# Patient Record
Sex: Female | Born: 1935 | Race: White | Hispanic: No | Smoking: Never smoker
Health system: Southern US, Community
[De-identification: ages and names within clinical notes are randomized; demographics above are authoritative.]

## PROBLEM LIST (undated history)

## (undated) DIAGNOSIS — G5600 Carpal tunnel syndrome, unspecified upper limb: Secondary | ICD-10-CM

## (undated) DIAGNOSIS — I10 Essential (primary) hypertension: Secondary | ICD-10-CM

## (undated) DIAGNOSIS — Z973 Presence of spectacles and contact lenses: Secondary | ICD-10-CM

## (undated) DIAGNOSIS — R269 Unspecified abnormalities of gait and mobility: Secondary | ICD-10-CM

## (undated) DIAGNOSIS — E039 Hypothyroidism, unspecified: Secondary | ICD-10-CM

## (undated) DIAGNOSIS — Z8673 Personal history of transient ischemic attack (TIA), and cerebral infarction without residual deficits: Secondary | ICD-10-CM

## (undated) DIAGNOSIS — M810 Age-related osteoporosis without current pathological fracture: Secondary | ICD-10-CM

## (undated) DIAGNOSIS — E785 Hyperlipidemia, unspecified: Secondary | ICD-10-CM

## (undated) DIAGNOSIS — M1990 Unspecified osteoarthritis, unspecified site: Secondary | ICD-10-CM

## (undated) DIAGNOSIS — Z974 Presence of external hearing-aid: Secondary | ICD-10-CM

## (undated) HISTORY — PX: COLONOSCOPY: SHX174

## (undated) HISTORY — DX: Hyperlipidemia, unspecified: E78.5

## (undated) HISTORY — PX: TONSILLECTOMY: SUR1361

## (undated) HISTORY — DX: Personal history of transient ischemic attack (TIA), and cerebral infarction without residual deficits: Z86.73

## (undated) HISTORY — PX: APPENDECTOMY: SHX54

---

## 2005-10-26 HISTORY — PX: SUBDURAL HEMATOMA EVACUATION VIA CRANIOTOMY: SUR319

## 2005-11-14 ENCOUNTER — Emergency Department: Payer: Self-pay | Admitting: Emergency Medicine

## 2005-11-15 ENCOUNTER — Emergency Department (HOSPITAL_COMMUNITY): Admission: EM | Admit: 2005-11-15 | Discharge: 2005-11-15 | Payer: Self-pay | Admitting: Emergency Medicine

## 2005-11-17 ENCOUNTER — Inpatient Hospital Stay (HOSPITAL_COMMUNITY): Admission: AD | Admit: 2005-11-17 | Discharge: 2005-11-23 | Payer: Self-pay | Admitting: Neurology

## 2006-03-17 ENCOUNTER — Ambulatory Visit: Payer: Self-pay | Admitting: Internal Medicine

## 2006-04-20 ENCOUNTER — Ambulatory Visit (HOSPITAL_COMMUNITY): Admission: RE | Admit: 2006-04-20 | Discharge: 2006-04-20 | Payer: Self-pay

## 2006-12-23 ENCOUNTER — Ambulatory Visit: Payer: Self-pay | Admitting: Internal Medicine

## 2007-07-14 ENCOUNTER — Ambulatory Visit: Payer: Self-pay | Admitting: Internal Medicine

## 2008-02-29 ENCOUNTER — Ambulatory Visit: Payer: Self-pay | Admitting: Internal Medicine

## 2009-03-04 ENCOUNTER — Ambulatory Visit: Payer: Self-pay | Admitting: Internal Medicine

## 2009-05-15 ENCOUNTER — Ambulatory Visit: Payer: Self-pay | Admitting: Ophthalmology

## 2009-05-18 ENCOUNTER — Emergency Department: Payer: Self-pay | Admitting: Emergency Medicine

## 2009-06-04 ENCOUNTER — Ambulatory Visit: Payer: Self-pay | Admitting: Ophthalmology

## 2010-04-10 ENCOUNTER — Ambulatory Visit: Payer: Self-pay | Admitting: Internal Medicine

## 2010-07-03 ENCOUNTER — Ambulatory Visit: Payer: Self-pay | Admitting: Ophthalmology

## 2010-07-14 ENCOUNTER — Ambulatory Visit: Payer: Self-pay | Admitting: Ophthalmology

## 2010-10-02 ENCOUNTER — Emergency Department (HOSPITAL_COMMUNITY): Admission: EM | Admit: 2010-10-02 | Discharge: 2010-03-14 | Payer: Self-pay | Admitting: Emergency Medicine

## 2011-03-13 NOTE — Procedures (Signed)
EEG NUMBER:  [id number].   ORDERED BY:  Dr. Avie Echevaria.   INDICATION:  This is a 77 year old woman being evaluated for subdural  hemorrhage.   DESCRIPTION OF PROCEDURE:  This a routine 17-channel EEG with 1 channel  devoted to EKG, utilizing the International 10/20 Lead Placement System.  The patient is described clinically as being awake and electrographically  also appears to be awake.  Medications listed include Synthroid, Evista and  aspirin.  The patient does appear to be in the waking state throughout the  course of the recording.  The background consists of a well-organized, well-  developed, well-modulated 10-Hz alpha activity which is predominant in the  posterior head regions and briskly reactive to eye opening.  No clear  interhemispheric asymmetry is identified.  No definite epileptiform  discharges are noted.  Hyperventilation was not performed.  Photic  stimulation was performed, but did not produce any significant change in the  background activity.  The EKG monitor reveals relatively regular rhythm with  a rate of 90 beats per minute.   CONCLUSION:  Normal EEG in the waking state without seizure activity or  focal abnormality seen during the course of today's recording.  Clinical  correlation is recommended.      Catherine A. Orlin Hilding, M.D.  Electronically Signed     ZOX:WRUE  D:  11/17/2005 16:14:45  T:  11/18/2005 09:31:14  Job #:  [id number]

## 2011-03-13 NOTE — Consult Note (Signed)
NAMEJESSI, Vargas NO.:  [id number]   MEDICAL RECORD NO.:  [id number]          PATIENT TYPE:  INP   LOCATION:  2550                         FACILITY:  [facility]   PHYSICIAN:  Danae Orleans. Venetia Maxon, M.D.  DATE OF BIRTH:  [birth year]   DATE OF CONSULTATION:  11/18/2005  DATE OF DISCHARGE:                                   CONSULTATION   REFERRING PHYSICIAN:  Dr. Genene Churn. Love.   REASON FOR CONSULTATION:  Left subdural hematoma.   HISTORY OF ILLNESS:  [name] is a 77 year old woman who was admitted by  Dr. Avie Echevaria to the [facility] for worsening of dizziness and  right-sided weakness, and I was asked to see the patient in consultation.  The patient has been found to have a left-sided subdural hematoma in the  parietal region which appears to be chronic and has been having problems  with hand weakness and right hand clumsiness.  She was doing well until  November 14, 2005, when she developed right hand weakness.  She had a head CT  which showed a subdural hematoma.  There is no history of head trauma.  She  was then admitted for workup and an MRI shows an 8-mm subdural hematoma with  no evidence of stroke.  She has had an EEG which shows no evidence of  seizure.  She had carotid Dopplers which showed no evidence of plaque or  atherosclerotic occlusive disease.  She has a history of Raynaud's and  thyroid disease and osteoporosis.  She has had benign breast lumps removed.  I saw the patient and spoke with the patient and with Dr. Sandria Manly and was of  the opinion that this left-sided subdural hematoma, which could indeed be  causing some local mass effect which might be causing right-sided weakness  and given the fact that she has no evidence of seizures or other cause of  this problem, that it was reasonable to go ahead with bur hole drainage of  the subdural hematoma.   PHYSICAL EXAMINATION:  NEUROLOGIC:  On examination, the patient has right  hand intrinsic  and finger extensor weakness and does have right pronator  drift.   ASSESSMENT AND PLAN:  She does recall that she may have hit her head on a  cabinet or attic door.  I felt that the subdural was relatively  longstanding.  After a lengthy discussion with the patient and the patient's  family, she wishes to go ahead with the surgery and this will consist of bur  hole drainage of the subdural hematoma, and this is set up for November 19, 2005.      Danae Orleans. Venetia Maxon, M.D.  Electronically Signed     JDS/MEDQ  D:  11/19/2005  T:  11/20/2005  Job:  [id number]

## 2011-03-13 NOTE — Op Note (Signed)
NAMEGWENDOLIN, [name] NO.:  [id number]   MEDICAL RECORD NO.:  [id number]          PATIENT TYPE:  INP   LOCATION:  2550                         FACILITY:  [facility]   PHYSICIAN:  Danae Orleans. Venetia Maxon, M.D.  DATE OF BIRTH:  Dec 20, 1926   DATE OF PROCEDURE:  11/19/2005  DATE OF DISCHARGE:                                 OPERATIVE REPORT   PREOPERATIVE DIAGNOSIS:  Left parietal occipital subdural hematoma with  right drift and weakness.   POSTOPERATIVE DIAGNOSIS:  Left parietal occipital subdural hematoma with  right drift and weakness.   OPERATION PERFORMED:  Bur hole drainage of left subdural hematoma.   SURGEON:  Danae Orleans. Venetia Maxon, M.D.   ANESTHESIA:  General endotracheal.   ESTIMATED BLOOD LOSS:  Minimal.   COMPLICATIONS:  None.   DISPOSITION:  Recovery.   INDICATIONS FOR PROCEDURE:  [name] is a 77 year old woman with right  pronator drift and right upper extremity weakness.  She has a left parietal  occipital subdural hematoma with local mass effect.  It was elected to take  her to surgery for drainage of subdural hematoma.   DESCRIPTION OF PROCEDURE:  [name] was brought to the operating room.  After smooth and uncomplicated induction of general endotracheal anesthesia,  she was placed in a prone position using a Proneview head holder and her  left parietal scalp was then shaved, prepped and draped in the usual sterile  fashion.  A linear incision was made over what was felt to be the epicenter  of the subdural and this was carried through the galea.  A bur hole was  placed.  Dura was incised and motor oil consistence subdural fluid was  released under moderate but not high pressure.  Subsequently, the subdural  space was irrigated.  There did not appear to be septations.  A #7 Al Pimple drain was placed through a separate stab incision and then this was  anchored with a nylon stitch. The wound was closed with interrupted 2-0  Vicryl stitches  reapproximating the galea and a running 3-0 nylon stitch and  a sterile occlusive dressing was placed.  Drain was placed on thumb suction.  The patient was extubated in the operating room and taken to recovery room  in stable and satisfactory condition having tolerated the operation well.  All counts were correct at the end of the case.      Danae Orleans. Venetia Maxon, M.D.  Electronically Signed    JDS/MEDQ  D:  11/19/2005  T:  11/20/2005  Job:  [id number]

## 2011-03-13 NOTE — H&P (Signed)
NAMEDAVINIA, RICCARDI NO.:  [id number]   MEDICAL RECORD NO.:  [id number]          PATIENT TYPE:  INP   LOCATION:  3027                         FACILITY:  [facility]   PHYSICIAN:  Genene Churn. Love, M.D.    DATE OF BIRTH:  [birth year]   DATE OF ADMISSION:  11/17/2005  DATE OF DISCHARGE:                                HISTORY & PHYSICAL   PATIENT ADDRESS:  [address] [place], [place] [address]   This is the first [facility] admission for this 77 year old right-  handed, Caucasian, married female from [place], [place], seen on  surgery basis and admitted for evaluation of symptoms generalized weakness  and right hand and arm clumsiness.   HISTORY OF PRESENT ILLNESS:  Mrs. Gruenhagen was in her usual state of health  until Saturday morning, November 14, 2005, when she felt weak and was not  really certain why  She notes that her right arm was not feeling well.  She  had slurred speech, and she was to drive to [place], but en route returned  home and was seen in [facility] where a CT scan was  performed showing evidence of what was suspected to be a left  temporoparietal chronic subdural hematoma.  On Sunday, November 15, 2005, she  noted poor handwriting, difficulty with balance, and some cramping episodes  in her right arm and hand distally.  She was seen at [facility] on  November 15, 2005, underwent an MRI study of the brain showing an 8 mm left  frontotemporal-parietal subdural hematoma with some compression of the  cortical surface . This was thought to represent a chronic structure.  Other  considerations were the possibility of an epidural hematoma, but there was  no history of head or neck trauma.  She had no evidence of any underlying  acute stroke and very little, if any, small vessel white matter ischemic  changes present.  She was seen by me November 16, 2005, on Monday in the  office for evaluation of the above at which  time I noted some clumsiness in  her right hand and arm but no other focal abnormalities and felt her  symptomatology was possibly a waxing and waning from the subdural and  recommended EEG and Dopplers which were scheduled for November 18, 2005.   Today she came in on an urgent basis because she was having sensations of  feeling weak, having to lie down, but no definite new evidence of focal  weakness or history of documented seizure.  Because of her clinical  symptomatology, she is admitted for further evaluation.   MEDICAL REVIEW OF SYSTEMS:  Significant for possible seizures involving her  right hand and arm, frequent urination, speech difficulty, hearing loss,  poor circulation in her hands.   PAST MEDICAL HISTORY:  Significant for:  1.  Thyroid problems for 15 years.  2.  Raynaud's disease.  3.  Osteoporosis for 10 years.  4.  Benign breast lumps removed.   MEDICATIONS:  1.  Synthroid, dosage uncertain, probably 100 mcg per day.  2.  Evista 60 mg once per day.  3.  Aspirin 81 mg once per day.  4.  Calcium 500 mg 2 per day.   ALLERGIES:  No known allergies.   SOCIAL HISTORY:  She is married, happy most of the time.  Denies any home  problems.  Lives with her husband, has 4 daughters, ages 70 to 82, living  and well.   HEALTH HABITS:  She does not use caffeine or tobacco.  She does drink 2 to 3  ounces of alcohol per week.  She had some college and last worked in 1994.   FAMILY HISTORY:  Significant in that 2 of her daughters have had illness,  one celiac, the other Graves disease Sjogren's.  Her mother died in 63  from stomach cancer, father at 48 from chronic obstructive pulmonary  disease.  She has a brother who died from a brain tumor. She has sisters 40  through 51 living and well.  Her daughter has had celiac disease and  Sjogren's as mentioned.  A daughter of the patient had thyroid disease.  Her  mother and sister have had high blood pressure.   PHYSICAL  EXAMINATION:  GENERAL: Well-developed, pleasant white female.  VITAL SIGNS: Weight 136 pounds, height 62 inches.  Blood pressure in the  right and left arm 140/80, heart rate 80 and regular.  NECK: There were no carotid or supraclavicular bruits heard today.  Neck  flexion and extension maneuvers were unremarkable.  NEUROLOGIC:  Mental Status: She was alert and oriented x3.  Followed 1, 2,  and 3-step commands .  Scored 29/30 on MMSE.  Her cranial nerve examination  revealed acuity not checked today.  It was 20/30 on the right arm, 20/40 -1  on November 16, 2005.  The visual fields were full. Disks were flat.  Spontaneous venous pulsation seen.  Extraocular movements were full, and  corneals were present.  Facial sensation was equal.  Facial motor asymmetry.  Hearing was intact.  Air conduction was greater than bone conduction.  Tongue was midline.  The uvula was midline, and gags were present.  Motor  examination revealed good strength in the upper and lower extremities.  With  the dynamometer, she squeezed x1 14 kg right hand -5, 43 kg right hand x1,  12 kg right hand x5, 42 kg left hand.  Finger tapping could be done 33 times  in 10 seconds, right hand index finger 32 times in 10 seconds.  Left hand  index finger done. Sensory examination was intact to pinprick, light touch,  turning of positron, and vibration testing.  Deep tendon reflexes were 1 to  2+.  Plantar responses were downgoing. She had some clumsiness of the right  hand and arm.  HEENT: Hearing aid in the left ear.  Right tympanic membrane clear.  Mouth  in good repair.  LUNGS: Clear to auscultation.  HEART:  No murmurs.  BREASTS: Without masses.  ABDOMEN:  No enlargement of liver, spleen, or kidneys.  EXTREMITIES:  No cyanosis, clubbing, or edema.   IMPRESSION:  1.  Right hand and arm clumsiness (Code 342.10).  2.  Subdural hematoma (Code 852.20).  PLAN:  Consider the possibility of seizures, underlying transient  ischemic  attacks, or the subdural itself causing her symptomatology.  Plan is to  admit her for EEG, Doppler studies, and neurosurgical consultation.          ______________________________  Genene Churn. Sandria Manly, M.D.    JML/MEDQ  D:  11/17/2005  T:  11/17/2005  Job:  [id number]

## 2011-03-13 NOTE — Discharge Summary (Signed)
[name], [name] NO.:  [id number]   MEDICAL RECORD NO.:  [id number]          PATIENT TYPE:  INP   LOCATION:  3018                         FACILITY:  [facility]   PHYSICIAN:  Genene Churn. Love, M.D.    DATE OF BIRTH:  1927/07/24   DATE OF ADMISSION:  11/17/2005  DATE OF DISCHARGE:  11/23/2005                                 DISCHARGE SUMMARY   This was the first [facility] admission for this 77 year old right-  handed Caucasian married female from [place], [place]. Seen on an  urgent basis in the office and admitted for evaluation of right-sided hand  clumsiness and known subdural hematoma.   HISTORY OF PRESENT ILLNESS:  Ms. Paterson developed generalized weakness on  November 14, 2005 and felt that her right hand arm were not working quite  right in a trip to [place], [place]. She returned to [facility] where CT scan was performed of the brain showing a left  temporoparietal chronic subdural hematoma. On Sunday, November 15, 2005, she  had poor handwriting, difficulty with balance and some cramping episodes in  her right hand and arm. She was seen at [facility] and underwent a  MRI study of the brain showing an 8 mm left frontal temporoparietal subdural  hematoma with some compression of the cortical surface but no shift of  midline structures. There was no underlying stroke and the considerations  were that of epidural hematoma versus subdural hematoma. There was thought  to be some blood in the subarachnoid space near the subdural surface. There  was very little if any small vessel ischemic changes. She was seen by me for  the first time on November 16, 2005 on Monday in the office for evaluation of  the above and noted to have some right hand and arm clumsiness. It was not  clear whether subdural or possibly underlying stroke or possibly seizure was  the cause of her symptomatology and she was scheduled for EEG and  Doppler  studies as an outpatient but developed continued right hand and arm  clumsiness, not feeling well and was seen in the office on November 17, 2005  and admitted for neurosurgical consultation and evaluation.   Her past medical history is significant for thyroid problems for 15 years,  Raynaud's disease, osteoporosis for 10 years.   Medications at the time of admission included Synthroid 100 mcg per day,  Evista 60 milligrams once a day, aspirin 81 milligrams once a day, calcium  500 milligrams twice a day. She has no known history of allergies. Details  of her social history and family history been dictated elsewhere.  Examination at the time of admission revealed normal blood pressures and  some clumsiness in her right hand and arm as noted on the admission notes.   LABORATORY DATA:  Laboratory data in the hospital revealed white blood cell  count of 8500, hemoglobin 13.6, hematocrit 39.1, platelet count 278,000. Sed  rate was 13. Pro time was 13.6. INR was 1.0. PTT was 28. Bleeding time 3.0.  Sodium 140, potassium 3.6.  Chloride 106, CO2 content 30, glucose 153, BUN  18, creatinine 0.9. Total bilirubin 0.3, alk phos 63, SGOT 21, SGPT 17,  total protein 6.6, albumin 3.5, calcium 9.3. Repeat electrolytes in the  hospital and basic metabolic panel were normal except creatinine went to 1.3  and on April 20, 2006 it had returned to 0.9. Her chest x-ray was normal. Her  12-lead EKG showed normal sinus rhythm. It was a normal EKG. A TSH was  4.422. Repeat Doppler studies of the carotids were unremarkable. EEG was  normal. Repeat MRI study of the brain following admission showed no change  in the 8 mm subdural hematoma on the left frontal temporoparietal region.  Again no shift and no underlying stroke noted. CT scan postoperatively  showed drain in place and no evidence of subdural hematoma.   HOSPITAL COURSE:  The patient was admitted, underwent Doppler study which  was unremarkable  and EEG which was unremarkable the day of admission. She  was seen the following day by Dr. Imelda Pillow. She underwent pro time, PTT and  INR. No clear cut history of head trauma was obtained by neurologist but Dr.  Venetia Maxon felt that there was a history of her having struck her head enough  that this could have caused a subdural hematoma. She underwent evacuation of  the subdural hematoma on November 19, 2005. Postoperatively, CT scan showed  that there was no evidence of a subdural. Drain was removed on November 22, 2005. The patient's right hand and arm clumsiness improved in the hospital  following surgery and was just about back to normal. She could write with  her hand. She noted some clumsiness on walking.   IMPRESSION:  1.  Subdural hematoma, code 852.20 with right hand and arm clumsiness, code      342.10.  2.  Gait disorder, code 781.2.  3.  History of hyperthyroidism, code 244.9.  4.  Osteoporosis, code unknown.   PLAN:  At this time, we will discharge the patient on Keppra 250 milligrams  t.i.d. for 1 month and then taper, levothyroxine 100 mcg p.o. daily, Evista  60 milligrams daily, calcium 500 milligrams b.i.d. She is not to take  aspirin. She is discharged on seizure precautions, not to drive a car and  return to see me in four weeks and Dr. Venetia Maxon in the next few weeks.           ______________________________  Genene Churn. Sandria Manly, M.D.     JML/MEDQ  D:  11/23/2005  T:  11/24/2005  Job:  [id number]   cc:   Danae Orleans. Venetia Maxon, M.D.  Fax: [phone]   Dewaine Oats  Fax: [phone]

## 2011-06-15 ENCOUNTER — Ambulatory Visit: Payer: Self-pay | Admitting: Internal Medicine

## 2011-09-10 ENCOUNTER — Ambulatory Visit: Payer: Self-pay | Admitting: Internal Medicine

## 2011-12-15 DIAGNOSIS — I10 Essential (primary) hypertension: Secondary | ICD-10-CM | POA: Diagnosis not present

## 2011-12-15 DIAGNOSIS — R413 Other amnesia: Secondary | ICD-10-CM | POA: Diagnosis not present

## 2011-12-15 DIAGNOSIS — Z8669 Personal history of other diseases of the nervous system and sense organs: Secondary | ICD-10-CM | POA: Diagnosis not present

## 2012-05-04 DIAGNOSIS — E785 Hyperlipidemia, unspecified: Secondary | ICD-10-CM | POA: Diagnosis not present

## 2012-05-04 DIAGNOSIS — Z0000 Encounter for general adult medical examination without abnormal findings: Secondary | ICD-10-CM | POA: Diagnosis not present

## 2012-05-04 DIAGNOSIS — I10 Essential (primary) hypertension: Secondary | ICD-10-CM | POA: Diagnosis not present

## 2012-05-04 DIAGNOSIS — E039 Hypothyroidism, unspecified: Secondary | ICD-10-CM | POA: Diagnosis not present

## 2012-05-13 DIAGNOSIS — E039 Hypothyroidism, unspecified: Secondary | ICD-10-CM | POA: Diagnosis not present

## 2012-05-13 DIAGNOSIS — Z1289 Encounter for screening for malignant neoplasm of other sites: Secondary | ICD-10-CM | POA: Diagnosis not present

## 2012-05-13 DIAGNOSIS — Z1212 Encounter for screening for malignant neoplasm of rectum: Secondary | ICD-10-CM | POA: Diagnosis not present

## 2012-05-13 DIAGNOSIS — I10 Essential (primary) hypertension: Secondary | ICD-10-CM | POA: Diagnosis not present

## 2012-06-17 ENCOUNTER — Ambulatory Visit: Payer: Self-pay | Admitting: Internal Medicine

## 2012-06-17 DIAGNOSIS — Z1231 Encounter for screening mammogram for malignant neoplasm of breast: Secondary | ICD-10-CM | POA: Diagnosis not present

## 2012-07-20 DIAGNOSIS — H903 Sensorineural hearing loss, bilateral: Secondary | ICD-10-CM | POA: Diagnosis not present

## 2012-07-20 DIAGNOSIS — H6120 Impacted cerumen, unspecified ear: Secondary | ICD-10-CM | POA: Diagnosis not present

## 2012-07-26 DIAGNOSIS — Z23 Encounter for immunization: Secondary | ICD-10-CM | POA: Diagnosis not present

## 2012-10-24 DIAGNOSIS — H43819 Vitreous degeneration, unspecified eye: Secondary | ICD-10-CM | POA: Diagnosis not present

## 2012-11-08 DIAGNOSIS — M129 Arthropathy, unspecified: Secondary | ICD-10-CM | POA: Diagnosis not present

## 2012-11-08 DIAGNOSIS — R109 Unspecified abdominal pain: Secondary | ICD-10-CM | POA: Diagnosis not present

## 2013-06-19 ENCOUNTER — Ambulatory Visit: Payer: Self-pay | Admitting: Internal Medicine

## 2013-06-30 ENCOUNTER — Other Ambulatory Visit: Payer: Self-pay | Admitting: Orthopedic Surgery

## 2013-07-04 ENCOUNTER — Encounter (HOSPITAL_BASED_OUTPATIENT_CLINIC_OR_DEPARTMENT_OTHER): Payer: Self-pay | Admitting: *Deleted

## 2013-07-04 NOTE — Progress Notes (Signed)
Daughter staying with her-to bring for ekg and bmet

## 2013-07-05 ENCOUNTER — Other Ambulatory Visit: Payer: Self-pay

## 2013-07-05 ENCOUNTER — Encounter (HOSPITAL_BASED_OUTPATIENT_CLINIC_OR_DEPARTMENT_OTHER)
Admission: RE | Admit: 2013-07-05 | Discharge: 2013-07-05 | Disposition: A | Discharge status: Home / Self Care | Payer: Medicare Other | Source: Ambulatory Visit | Attending: Orthopedic Surgery | Admitting: Orthopedic Surgery

## 2013-07-05 DIAGNOSIS — Z01812 Encounter for preprocedural laboratory examination: Secondary | ICD-10-CM | POA: Insufficient documentation

## 2013-07-05 DIAGNOSIS — Z01818 Encounter for other preprocedural examination: Secondary | ICD-10-CM | POA: Insufficient documentation

## 2013-07-05 DIAGNOSIS — Z01810 Encounter for preprocedural cardiovascular examination: Secondary | ICD-10-CM | POA: Insufficient documentation

## 2013-07-05 LAB — BASIC METABOLIC PANEL
BUN: 22 mg/dL (ref 6–23)
Creatinine, Ser: 0.78 mg/dL (ref 0.50–1.10)
GFR calc Af Amer: 87 mL/min — ABNORMAL LOW (ref >90)
GFR calc non Af Amer: 75 mL/min — ABNORMAL LOW (ref >90)
Glucose, Bld: 104 mg/dL — ABNORMAL HIGH (ref 70–99)
Potassium: 4.2 mEq/L (ref 3.5–5.1)

## 2013-07-10 NOTE — H&P (Signed)
[name] is an 79 y.o. female.   Chief Complaint: c/o chronic and progressive numbness and tingling of the left hand HPI:.  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  She had significant leg pain in 2007 and was diagnosed by her PCP and Dr. Phylliss Bob to have polymyalgia rheumatica. She was initially treated with high dose steroids and has tapered down to 5 mg. daily.  During the past week she has had severe stiffness of her left hand develop with generalized swelling.  She has lost her extension in flexion creases of the left hand.  The appearance of her hand is that of either an acute gout or a dystrophic response such as CRPS type I or II.    She saw Dr. Azzie Roup for evaluation and had an ultrasound that was nondiagnostic.  She has continued her prednisone at 5 mg. daily.  Her last sed rate was one year prior.  She is extremely stiff on the left and cannot close her fingers to the palm.  She has mild tingling day and night. She is now referred for an upper extremity orthopaedic consult.      Past Medical History  Diagnosis Date  . Hypertension   . Arthritis   . Carpal tunnel syndrome   . Osteoporosis   . Gait abnormality     has for 42yr-no use of cane  . Hypothyroidism   . Wears glasses   . Wears hearing aid     Past Surgical History  Procedure Laterality Date  . Tonsillectomy    . Appendectomy    . Subdural hematoma evacuation via craniotomy  2007  . Colonoscopy      History reviewed. No pertinent family history. Social History:  reports that she has never smoked. She does not have any smokeless tobacco history on file. She reports that  drinks alcohol. She reports that she does not use illicit drugs.  Allergies: No Known Allergies  No prescriptions prior to admission    No results found for this or any previous  visit (from the past 48 hour(s)).  No results found.   Pertinent items are noted in HPI.  Height 5\' 3"  (1.6 m), weight 65.318 kg (144 lb).  General appearance: alert Head: Normocephalic, without obvious abnormality Neck: supple, symmetrical, trachea midline Resp: clear to auscultation bilaterally Cardio: regular rate and rhythm GI: normal findings: bowel sounds normal Extremities:  Inspection of her hands reveals normal appearance of her right hand except for Heberden's and Bouchard's nodes.  She has pinch and grip impairment. Pulse and cap refill are intact.  Her motor and sensory examination is intact to light touch.  She has normal motor function.  She has Dupuytren's palmar fibromatosis bilaterally.    Plain films of her hands including the wrist demonstrate significant joint space n narrowing of the interphalangeal joints, left more prominent than right.  She has normal appearing carpus except for STT narrowing bilaterally and on the lateral film of the left wrist a volar ossicle.  She does not show frank chondrocalcinosis.  I asked Dr. Johna Roles to complete detailed electrodiagnostic studies.  These did, indeed, document bilateral carpal tunnel syndrome that was moderately severe.   Pulses: 2+ and symmetric Skin: normal Neurologic: Grossly normal    Assessment/Plan Impression: Left CTS  Plan: To the OR for left CTR.The procedure, risks,benefits and post-op course were  discussed with the patient at length and they were in agreement with the plan.  DASNOIT,Janilah Hojnacki J 07/10/2013, 11:48 AM   H&P documentation: 07/11/2013  -History and Physical Reviewed  -Patient has been re-examined  -No change in the plan of care  Wyn Forster, MD

## 2013-07-11 ENCOUNTER — Encounter (HOSPITAL_BASED_OUTPATIENT_CLINIC_OR_DEPARTMENT_OTHER): Payer: Self-pay | Admitting: *Deleted

## 2013-07-11 ENCOUNTER — Ambulatory Visit (HOSPITAL_BASED_OUTPATIENT_CLINIC_OR_DEPARTMENT_OTHER)
Admission: RE | Admit: 2013-07-11 | Discharge: 2013-07-11 | Disposition: A | Discharge status: Home / Self Care | Payer: Medicare Other | Source: Ambulatory Visit | Attending: Orthopedic Surgery | Admitting: Orthopedic Surgery

## 2013-07-11 ENCOUNTER — Encounter (HOSPITAL_BASED_OUTPATIENT_CLINIC_OR_DEPARTMENT_OTHER)
Admission: RE | Disposition: A | Discharge status: Home / Self Care | Payer: Self-pay | Source: Ambulatory Visit | Attending: Orthopedic Surgery

## 2013-07-11 ENCOUNTER — Ambulatory Visit (HOSPITAL_BASED_OUTPATIENT_CLINIC_OR_DEPARTMENT_OTHER): Payer: Medicare Other | Admitting: *Deleted

## 2013-07-11 DIAGNOSIS — M19049 Primary osteoarthritis, unspecified hand: Secondary | ICD-10-CM | POA: Insufficient documentation

## 2013-07-11 DIAGNOSIS — M069 Rheumatoid arthritis, unspecified: Secondary | ICD-10-CM | POA: Insufficient documentation

## 2013-07-11 DIAGNOSIS — G5600 Carpal tunnel syndrome, unspecified upper limb: Secondary | ICD-10-CM | POA: Insufficient documentation

## 2013-07-11 DIAGNOSIS — I10 Essential (primary) hypertension: Secondary | ICD-10-CM | POA: Insufficient documentation

## 2013-07-11 HISTORY — DX: Carpal tunnel syndrome, unspecified upper limb: G56.00

## 2013-07-11 HISTORY — DX: Presence of external hearing-aid: Z97.4

## 2013-07-11 HISTORY — DX: Essential (primary) hypertension: I10

## 2013-07-11 HISTORY — PX: CARPAL TUNNEL RELEASE: SHX101

## 2013-07-11 HISTORY — DX: Unspecified abnormalities of gait and mobility: R26.9

## 2013-07-11 HISTORY — DX: Presence of spectacles and contact lenses: Z97.3

## 2013-07-11 HISTORY — DX: Hypothyroidism, unspecified: E03.9

## 2013-07-11 HISTORY — DX: Unspecified osteoarthritis, unspecified site: M19.90

## 2013-07-11 HISTORY — DX: Age-related osteoporosis without current pathological fracture: M81.0

## 2013-07-11 LAB — POCT HEMOGLOBIN-HEMACUE: Hemoglobin: 14.8 g/dL (ref 12.0–15.0)

## 2013-07-11 SURGERY — CARPAL TUNNEL RELEASE
Anesthesia: General | Site: Wrist | Laterality: Left | Wound class: Clean

## 2013-07-11 MED ORDER — FENTANYL CITRATE 0.05 MG/ML IJ SOLN: 50.0000 ug | Freq: Once | INTRAMUSCULAR | Status: DC

## 2013-07-11 MED ORDER — FENTANYL CITRATE 0.05 MG/ML IJ SOLN: 25.0000 ug | INTRAMUSCULAR | Status: DC | PRN

## 2013-07-11 MED ORDER — ONDANSETRON HCL 4 MG/2ML IJ SOLN
INTRAMUSCULAR | Status: DC | PRN
  Administered 2013-07-11: 4 mg via INTRAVENOUS

## 2013-07-11 MED ORDER — EPHEDRINE SULFATE 50 MG/ML IJ SOLN
INTRAMUSCULAR | Status: DC | PRN
  Administered 2013-07-11: 10 mg via INTRAVENOUS

## 2013-07-11 MED ORDER — OXYCODONE HCL 5 MG PO TABS: 5.0000 mg | ORAL_TABLET | Freq: Once | ORAL | Status: DC | PRN

## 2013-07-11 MED ORDER — LIDOCAINE HCL (CARDIAC) 20 MG/ML IV SOLN
INTRAVENOUS | Status: DC | PRN
  Administered 2013-07-11: 100 mg via INTRAVENOUS

## 2013-07-11 MED ORDER — FENTANYL CITRATE 0.05 MG/ML IJ SOLN
INTRAMUSCULAR | Status: DC | PRN
  Administered 2013-07-11: 50 ug via INTRAVENOUS
  Administered 2013-07-11: 25 ug via INTRAVENOUS

## 2013-07-11 MED ORDER — OXYCODONE-ACETAMINOPHEN 5-325 MG PO TABS: ORAL_TABLET | ORAL | Status: DC

## 2013-07-11 MED ORDER — LACTATED RINGERS IV SOLN
INTRAVENOUS | Status: DC
  Administered 2013-07-11: 09:00:00 via INTRAVENOUS

## 2013-07-11 MED ORDER — LIDOCAINE HCL 2 % IJ SOLN
INTRAMUSCULAR | Status: DC | PRN
  Administered 2013-07-11: 4 mL

## 2013-07-11 MED ORDER — CHLORHEXIDINE GLUCONATE 4 % EX LIQD: 60.0000 mL | Freq: Once | CUTANEOUS | Status: DC

## 2013-07-11 MED ORDER — PROPOFOL 10 MG/ML IV BOLUS
INTRAVENOUS | Status: DC | PRN
  Administered 2013-07-11: 100 mg via INTRAVENOUS

## 2013-07-11 MED ORDER — OXYCODONE HCL 5 MG/5ML PO SOLN: 5.0000 mg | Freq: Once | ORAL | Status: DC | PRN

## 2013-07-11 SURGICAL SUPPLY — 38 items
BANDAGE ADHESIVE 1X3 (GAUZE/BANDAGES/DRESSINGS) IMPLANT
BANDAGE ELASTIC 3 VELCRO ST LF (GAUZE/BANDAGES/DRESSINGS) ×1 IMPLANT
BLADE SURG 15 STRL LF DISP TIS (BLADE) ×1 IMPLANT
BLADE SURG 15 STRL SS (BLADE) ×2
BNDG CMPR 9X4 STRL LF SNTH (GAUZE/BANDAGES/DRESSINGS) ×1
BNDG COHESIVE 3X5 TAN STRL LF (GAUZE/BANDAGES/DRESSINGS) ×1 IMPLANT
BNDG ESMARK 4X9 LF (GAUZE/BANDAGES/DRESSINGS) ×1 IMPLANT
BRUSH SCRUB EZ PLAIN DRY (MISCELLANEOUS) ×2 IMPLANT
CLOTH BEACON ORANGE TIMEOUT ST (SAFETY) ×2 IMPLANT
CORDS BIPOLAR (ELECTRODE) IMPLANT
COVER MAYO STAND STRL (DRAPES) ×2 IMPLANT
COVER TABLE BACK 60X90 (DRAPES) ×2 IMPLANT
CUFF TOURNIQUET SINGLE 18IN (TOURNIQUET CUFF) ×1 IMPLANT
DECANTER SPIKE VIAL GLASS SM (MISCELLANEOUS) ×1 IMPLANT
DRAPE EXTREMITY T 121X128X90 (DRAPE) ×2 IMPLANT
DRAPE SURG 17X23 STRL (DRAPES) ×2 IMPLANT
GLOVE BIO SURGEON STRL SZ 6.5 (GLOVE) ×1 IMPLANT
GLOVE BIOGEL M STRL SZ7.5 (GLOVE) ×1 IMPLANT
GLOVE ORTHO TXT STRL SZ7.5 (GLOVE) ×2 IMPLANT
GOWN BRE IMP PREV XXLGXLNG (GOWN DISPOSABLE) ×4 IMPLANT
GOWN PREVENTION PLUS XLARGE (GOWN DISPOSABLE) ×2 IMPLANT
NEEDLE 27GAX1X1/2 (NEEDLE) ×1 IMPLANT
PACK BASIN DAY SURGERY FS (CUSTOM PROCEDURE TRAY) ×2 IMPLANT
PAD CAST 3X4 CTTN HI CHSV (CAST SUPPLIES) ×1 IMPLANT
PADDING CAST ABS 4INX4YD NS (CAST SUPPLIES) ×1
PADDING CAST ABS COTTON 4X4 ST (CAST SUPPLIES) ×1 IMPLANT
PADDING CAST COTTON 3X4 STRL (CAST SUPPLIES) ×2
SPLINT PLASTER CAST XFAST 3X15 (CAST SUPPLIES) ×5 IMPLANT
SPLINT PLASTER XTRA FASTSET 3X (CAST SUPPLIES) ×5
SPONGE GAUZE 4X4 12PLY (GAUZE/BANDAGES/DRESSINGS) ×2 IMPLANT
STOCKINETTE 4X48 STRL (DRAPES) ×2 IMPLANT
STRIP CLOSURE SKIN 1/2X4 (GAUZE/BANDAGES/DRESSINGS) ×2 IMPLANT
SUT PROLENE 3 0 PS 2 (SUTURE) ×2 IMPLANT
SYR 3ML 23GX1 SAFETY (SYRINGE) IMPLANT
SYR CONTROL 10ML LL (SYRINGE) ×1 IMPLANT
TOWEL OR 17X24 6PK STRL BLUE (TOWEL DISPOSABLE) ×2 IMPLANT
TRAY DSU PREP LF (CUSTOM PROCEDURE TRAY) ×2 IMPLANT
UNDERPAD 30X30 INCONTINENT (UNDERPADS AND DIAPERS) ×2 IMPLANT

## 2013-07-11 NOTE — Brief Op Note (Signed)
07/11/2013  10:44 AM  PATIENT:  [name]  79 y.o. female  PRE-OPERATIVE DIAGNOSIS:  LEFT CARPAL TUNNEL SYNDROME  POST-OPERATIVE DIAGNOSIS:  left carpal tunnel syndrome  PROCEDURE:  Procedure(s): LEFT CARPAL TUNNEL RELEASE  SURGEON:  Surgeon(s): Wyn Forster., MD  PHYSICIAN ASSISTANT:   ASSISTANTS: surgical tech  ANESTHESIA:   general  EBL:  Total I/O In: 500 [I.V.:500] Out: -   DRAINS: none   LOCAL MEDICATIONS USED:  XYLOCAINE   SPECIMEN:  No Specimen  DISPOSITION OF SPECIMEN:  N/A  COUNTS:  YES  TOURNIQUET:   Total Tourniquet Time Documented: Upper Arm (Left) - 12 minutes Total: Upper Arm (Left) - 12 minutes   DICTATION: .Other Dictation: Dictation Number [id number]  PLAN OF CARE: Discharge to home after PACU    Delay start of Pharmacological VTE agent (>24hrs) due to surgical blood loss or risk of bleeding:  not applicable              [name] is a 79 y.o. female patient.  No diagnosis found. Past Medical History  Diagnosis Date  . Hypertension   . Arthritis   . Carpal tunnel syndrome   . Osteoporosis   . Gait abnormality     has for 94yr-no use of cane  . Hypothyroidism   . Wears glasses   . Wears hearing aid    No past surgical history pertinent negatives on file. Scheduled Meds: . chlorhexidine  60 mL Topical Once  . fentaNYL  50-100 mcg Intravenous Once   Continuous Infusions: . lactated ringers 20 mL/hr at 07/11/13 0915   PRN Meds:fentaNYL, oxyCODONE, oxyCODONE  No Known Allergies Active Problems:   * No active hospital problems. *  Blood pressure 135/75, pulse 63, temperature 97.7 F (36.5 C), temperature source Oral, resp. rate 20, height 5\' 3"  (1.6 m), weight 62.596 kg (138 lb), SpO2 99.00%.  @IPPOSUB @ @IPPOOBJ @ @IPPOAP @  Yahye Siebert JR,Adasyn Mcadams V 07/11/2013

## 2013-07-11 NOTE — Op Note (Signed)
NAMEKERRILYNN, [name] NO.:  [id number]  MEDICAL RECORD NO.:  [id number]  LOCATION:                               FACILITY:  [facility]  PHYSICIAN:  Katy Fitch. Tirsa Gail, M.D. DATE OF BIRTH:  [birth year]  DATE OF PROCEDURE:  07/11/2013 DATE OF DISCHARGE:  07/11/2013                              OPERATIVE REPORT   PREOPERATIVE DIAGNOSIS:  Severe chronic left carpal tunnel syndrome with background calcium pyrophosphate deposition disease and dystrophic response.  POSTOPERATIVE DIAGNOSIS:  Severe chronic left carpal tunnel syndrome with background calcium pyrophosphate deposition disease and dystrophic response.  OPERATION:  Release of left transverse carpal ligament with confirmation of calcium pyrophosphate deposition disease and ulnar bursa.  OPERATING SURGEON:  Katy Fitch. Sierra Bissonette, MD  ASSISTANT:  Surgical technician.  ANESTHESIA:  General by LMA.  SUPERVISING ANESTHESIOLOGIST:  Bedelia Person, MD.  INDICATIONS:  [name] is an 79 year old woman, referred through the courtesy of Dr. Azzie Roup, attending rheumatologist, for management of a very swollen and numb left hand.  Clinical examination revealed a history of polymyalgia rheumatica.  [name] had a very profoundly swollen and stiff left hand with findings compatible with either gout or pseudogout.  The uric acid was checked and found to be normal.  We presumed she likely had calcium pyrophosphate deposition disease.  We treated her with a taper course of prednisone over 7 days and completely relieved her swelling and significant stiffness.  She still had numbness.  Electrodiagnostic studies were confirmed by Dr. Johna Roles to reveal bilateral significant carpal tunnel syndrome.  We advised proceeding with left carpal tunnel release.  DESCRIPTION OF PROCEDURE:  [name] was interviewed in the holding area and her proper surgical site identified per protocol and marked with a marking pen.  She had  detailed anesthesia informed consent by Dr. Gypsy Balsam.  We had detailed informed consent in the office and had questions invited and answered with [name] and her family members in the holding area.  We advised that we are planning to proceed with release of the transverse carpal ligament, inspection in the median nerve, and contents of the carpal canal.  We are going to specifically look for signs of calcium pyrophosphate deposition disease based on her response to prednisone.  After informed consent, she was brought to the operating room at this time.  DESCRIPTION OF PROCEDURE:  [name] was brought to room 2 of the [facility] and placed in supine position on the operating table.  Following the induction of general anesthesia by LMA technique under Dr. Burnett Corrente direct supervision, the left hand and arm were prepped with Betadine soap and solution, sterilely draped.  A pneumatic tourniquet was applied to the proximal brachium.  Following exsanguination of left arm with Esmarch bandage, arterial tourniquet was inflated to 225 mmHg.  Following routine surgical time-out, procedure commenced with a 2.5-cm incision in the line of the ring finger and the palm.  Subcutaneous tissues were carefully divided, taking care to identify the palmar fascia.  The fascia was split in the line of its fibers, revealing the common sensory branch of the median nerve and the superficial palmar arch.  The distal margin  of the transverse carpal ligament was carefully isolated followed by passage of a Penfield 4 elevator through the carpal canal, separating the median nerve proper from the transverse carpal ligament.  The ligament was then released along its ulnar border, sequentially with micro tenotomy scissors, visualizing the contents of carpal canal.  The median nerve was very bruised and violaceous at the distal margin of the transverse carpal ligament.  The nerve was in a rather  ulnar position due to swelling of the ulnar bursa.  There is considerable fluid present and careful inspection of the tendons in the ulnar bursa revealed flocculent calcium consistent with calcium pyrophosphate deposition disease.  The volar forearm fascia was released.  There was quite a bit of swelling of the soft tissues, rendering visualization challenging.  We inspected the nerve which appeared to be displaced in a ulnar direction, but otherwise invested in inflamed tenosynovium.  Bleeding points along the margin of the released ligament were electrocauterized with bipolar current followed by repair of the skin with intradermal 3-0 Prolene suture.  A 2% lidocaine was infiltrated for postoperative comfort followed by placement of Steri-Strips, sterile gauze, sterile Webril, and a volar plaster splint, maintaining the wrist in 15 degrees of dorsiflexion.  For aftercare, [name] was provided a prescription for Percocet 5 mg 1 p.o. q.4 to 6 hours p.r.n. pain, 20 tablets without refill.     Katy Fitch Itay Mella, M.D.   ______________________________ Katy Fitch. Sally-Anne Wamble, M.D.    RVS/MEDQ  D:  07/11/2013  T:  07/11/2013  Job:  [id number]  cc:   Azzie Roup, MD

## 2013-07-11 NOTE — Anesthesia Procedure Notes (Signed)
Procedure Name: LMA Insertion Date/Time: 07/11/2013 10:15 AM Performed by: Meyer Russel Pre-anesthesia Checklist: Patient identified, Emergency Drugs available, Suction available and Patient being monitored Patient Re-evaluated:Patient Re-evaluated prior to inductionOxygen Delivery Method: Circle System Utilized Preoxygenation: Pre-oxygenation with 100% oxygen Intubation Type: IV induction Ventilation: Mask ventilation without difficulty LMA: LMA inserted LMA Size: 4.0 Number of attempts: 1 Airway Equipment and Method: bite block Placement Confirmation: positive ETCO2 and breath sounds checked- equal and bilateral Tube secured with: Tape Dental Injury: Teeth and Oropharynx as per pre-operative assessment

## 2013-07-11 NOTE — Transfer of Care (Signed)
Immediate Anesthesia Transfer of Care Note  Patient: [name]  Procedure(s) Performed: Procedure(s): CARPAL TUNNEL RELEASE (Left)  Patient Location: PACU  Anesthesia Type:General  Level of Consciousness: awake, alert  and oriented  Airway & Oxygen Therapy: Patient Spontanous Breathing and Patient connected to face mask oxygen  Post-op Assessment: Report given to PACU RN, Post -op Vital signs reviewed and stable and Patient moving all extremities  Post vital signs: Reviewed and stable  Complications: No apparent anesthesia complications

## 2013-07-11 NOTE — Anesthesia Preprocedure Evaluation (Signed)
Anesthesia Evaluation  Patient identified by MRN, date of birth, ID band Patient awake    Reviewed: Allergy & Precautions, H&P , NPO status , Patient's Chart, lab work & pertinent test results  Airway Mallampati: I TM Distance: >3 FB Neck ROM: Full    Dental   Pulmonary  breath sounds clear to auscultation        Cardiovascular hypertension, Rhythm:Regular Rate:Normal     Neuro/Psych    GI/Hepatic   Endo/Other  Hypothyroidism   Renal/GU      Musculoskeletal   Abdominal   Peds  Hematology   Anesthesia Other Findings   Reproductive/Obstetrics                           Anesthesia Physical Anesthesia Plan  ASA: II  Anesthesia Plan: General   Post-op Pain Management:    Induction: Intravenous  Airway Management Planned: LMA  Additional Equipment:   Intra-op Plan:   Post-operative Plan: Extubation in OR  Informed Consent: I have reviewed the patients History and Physical, chart, labs and discussed the procedure including the risks, benefits and alternatives for the proposed anesthesia with the patient or authorized representative who has indicated his/her understanding and acceptance.     Plan Discussed with: CRNA and Surgeon  Anesthesia Plan Comments:         Anesthesia Quick Evaluation

## 2013-07-11 NOTE — Anesthesia Postprocedure Evaluation (Signed)
  Anesthesia Post-op Note  Patient: [name]  Procedure(s) Performed: Procedure(s): CARPAL TUNNEL RELEASE (Left)  Patient Location: PACU  Anesthesia Type:General  Level of Consciousness: awake  Airway and Oxygen Therapy: Patient Spontanous Breathing  Post-op Pain: mild  Post-op Assessment: Post-op Vital signs reviewed, Patient's Cardiovascular Status Stable, Respiratory Function Stable, Patent Airway, No signs of Nausea or vomiting and Pain level controlled  Post-op Vital Signs: stable  Complications: No apparent anesthesia complications

## 2013-07-12 ENCOUNTER — Encounter (HOSPITAL_BASED_OUTPATIENT_CLINIC_OR_DEPARTMENT_OTHER): Payer: Self-pay | Admitting: Orthopedic Surgery

## 2013-12-05 DIAGNOSIS — I10 Essential (primary) hypertension: Secondary | ICD-10-CM | POA: Diagnosis not present

## 2013-12-26 DIAGNOSIS — I10 Essential (primary) hypertension: Secondary | ICD-10-CM | POA: Diagnosis not present

## 2014-02-14 DIAGNOSIS — Z961 Presence of intraocular lens: Secondary | ICD-10-CM | POA: Diagnosis not present

## 2014-03-07 DIAGNOSIS — H6120 Impacted cerumen, unspecified ear: Secondary | ICD-10-CM | POA: Diagnosis not present

## 2014-03-07 DIAGNOSIS — H903 Sensorineural hearing loss, bilateral: Secondary | ICD-10-CM | POA: Diagnosis not present

## 2014-07-05 DIAGNOSIS — H903 Sensorineural hearing loss, bilateral: Secondary | ICD-10-CM | POA: Diagnosis not present

## 2014-07-05 DIAGNOSIS — H6120 Impacted cerumen, unspecified ear: Secondary | ICD-10-CM | POA: Diagnosis not present

## 2014-07-05 DIAGNOSIS — H60509 Unspecified acute noninfective otitis externa, unspecified ear: Secondary | ICD-10-CM | POA: Diagnosis not present

## 2014-07-26 DIAGNOSIS — Z23 Encounter for immunization: Secondary | ICD-10-CM | POA: Diagnosis not present

## 2014-09-07 ENCOUNTER — Encounter: Payer: Self-pay | Admitting: Internal Medicine

## 2014-09-07 ENCOUNTER — Ambulatory Visit (INDEPENDENT_AMBULATORY_CARE_PROVIDER_SITE_OTHER): Payer: Medicare Other | Admitting: Internal Medicine

## 2014-09-07 VITALS — BP 136/64 | HR 80 | Temp 97.6°F | Resp 14 | Ht 63.25 in | Wt 139.8 lb

## 2014-09-07 DIAGNOSIS — E559 Vitamin D deficiency, unspecified: Secondary | ICD-10-CM

## 2014-09-07 DIAGNOSIS — E038 Other specified hypothyroidism: Secondary | ICD-10-CM

## 2014-09-07 DIAGNOSIS — M353 Polymyalgia rheumatica: Secondary | ICD-10-CM

## 2014-09-07 DIAGNOSIS — R4181 Age-related cognitive decline: Secondary | ICD-10-CM

## 2014-09-07 DIAGNOSIS — Z23 Encounter for immunization: Secondary | ICD-10-CM | POA: Diagnosis not present

## 2014-09-07 DIAGNOSIS — I10 Essential (primary) hypertension: Secondary | ICD-10-CM | POA: Diagnosis not present

## 2014-09-07 DIAGNOSIS — Z79899 Other long term (current) drug therapy: Secondary | ICD-10-CM | POA: Diagnosis not present

## 2014-09-07 LAB — COMPREHENSIVE METABOLIC PANEL
ALK PHOS: 69 U/L (ref 39–117)
ALT: 17 U/L (ref 0–35)
AST: 22 U/L (ref 0–37)
Albumin: 3.3 g/dL — ABNORMAL LOW (ref 3.5–5.2)
BILIRUBIN TOTAL: 0.3 mg/dL (ref 0.2–1.2)
BUN: 21 mg/dL (ref 6–23)
CO2: 21 meq/L (ref 19–32)
CREATININE: 0.9 mg/dL (ref 0.4–1.2)
Calcium: 9.3 mg/dL (ref 8.4–10.5)
Chloride: 105 mEq/L (ref 96–112)
GFR: 60.84 mL/min (ref >60.00)
GLUCOSE: 123 mg/dL — AB (ref 70–99)
Potassium: 4 mEq/L (ref 3.5–5.1)
SODIUM: 138 meq/L (ref 135–145)
TOTAL PROTEIN: 6.8 g/dL (ref 6.0–8.3)

## 2014-09-07 LAB — SEDIMENTATION RATE: SED RATE: 17 mm/h (ref 0–22)

## 2014-09-07 LAB — CBC WITH DIFFERENTIAL/PLATELET
BASOS PCT: 0.5 % (ref 0.0–3.0)
Basophils Absolute: 0.1 10*3/uL (ref 0.0–0.1)
EOS PCT: 0.8 % (ref 0.0–5.0)
Eosinophils Absolute: 0.1 10*3/uL (ref 0.0–0.7)
HCT: 41 % (ref 36.0–46.0)
Hemoglobin: 13.3 g/dL (ref 12.0–15.0)
LYMPHS PCT: 15.5 % (ref 12.0–46.0)
Lymphs Abs: 1.5 10*3/uL (ref 0.7–4.0)
MCHC: 32.4 g/dL (ref 30.0–36.0)
MCV: 92.7 fl (ref 78.0–100.0)
MONO ABS: 0.5 10*3/uL (ref 0.1–1.0)
MONOS PCT: 5.1 % (ref 3.0–12.0)
NEUTROS PCT: 78.1 % — AB (ref 43.0–77.0)
Neutro Abs: 7.5 10*3/uL (ref 1.4–7.7)
PLATELETS: 262 10*3/uL (ref 150.0–400.0)
RBC: 4.42 Mil/uL (ref 3.87–5.11)
RDW: 13.2 % (ref 11.5–15.5)
WBC: 9.6 10*3/uL (ref 4.0–10.5)

## 2014-09-07 LAB — TSH: TSH: 1.34 u[IU]/mL (ref 0.35–4.50)

## 2014-09-07 LAB — VITAMIN D 25 HYDROXY (VIT D DEFICIENCY, FRACTURES): VITD: 33.62 ng/mL (ref 30.00–100.00)

## 2014-09-07 NOTE — Patient Instructions (Addendum)
I am checking your thyroid function today  You can try reducing your prednisone to every other day .  We will repeat your bone density test  In 2016  You received the pneumonia vaccine today , your last and final one!!!   Return in 6 month for you wellness exam

## 2014-09-07 NOTE — Progress Notes (Signed)
Pre-visit discussion using our clinic review tool. No additional management support is needed unless otherwise documented below in the visit note.  

## 2014-09-07 NOTE — Progress Notes (Addendum)
Patient ID: [name], female   DOB: August 30, 1927, 80 y.o.   MRN: [id number] Patient Active Problem List   Diagnosis Date Noted  . Age-related cognitive decline 09/09/2014  . PMR (polymyalgia rheumatica) 09/09/2014  . Long-term use of high-risk medication 09/09/2014    Subjective:  CC:   Chief Complaint  Patient presents with  . Establish Care    HPI:   Mary Detamore Rogersis a 80 y.o. female who presents as a new patient with several issues to discuss:  Memory concerns.  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  Her oldest daughter has been "spreading the rumor" to friends of family that she has Alzheimers Dementia. She feels that her daughter is overstating her cognitive deficits and watching her like a hawk. Patient states that she has a history of doing "funny things" which predates her cognitive decline.  Has a history of SDH  in 2005. Had a formal evaluation by [name], [facility]  in 2006 ,and her cognitive changes were reportedly  NOT Alzhemiers per [name]   History of left  hand swelling, mulitiple evaluations done including rheumatology.  [name]  Etiology still unclear.Has been treated for PMR with low dose prednisone by rheumatology for the past 3 years .  She is currently pain free   Last DEXA was over 2 years ago ,  She thinks  Sees dentist every 6 months  in [place] sleep well.  Wide awake all day long, sleeps only 5 hours,    History of benign breast masses biopsies in 1970's .  Big toe on left foot hurts constantly not from gout.   Not affecting sleep   Past Medical History  Diagnosis Date  . Hypertension   .  Arthritis   . Carpal tunnel syndrome   . Osteoporosis   . Gait abnormality     has for 107yrno use of cane  . Hypothyroidism   . Wears glasses   . Wears hearing aid      No Known Allergies   Past Surgical History  Procedure Laterality Date  . Tonsillectomy    . Appendectomy    . Subdural hematoma evacuation via craniotomy  2007  . Colonoscopy    . Carpal tunnel release Left 07/11/2013    Procedure: CARPAL TUNNEL RELEASE;  Surgeon: Mary Sickle, MD;  Location[facility]  Service: Orthopedics;  Laterality: Left;    History   Social History  . Marital Status: Single    Spouse Name: N/A    Number of Children: N/A  . Years of Education: N/A   Occupational History  . Not on file.   Social History Main Topics  . Smoking status: Never Smoker   . Smokeless tobacco: Not on file  . Alcohol Use: Yes     Comment: occ  . Drug Use: No  . Sexual Activity: Not on file   Other Topics Concern  . Not on file   Social History Narrative   Family History  Problem Relation Age of Onset  . Cancer Mother 957   breast ca  . Cancer Maternal Aunt   .  Cancer Brother     pancreatic ca       Review of Systems:   The rest of the review of systems was negative except those addressed in the HPI.      Objective:  BP 136/64 mmHg  Pulse 80  Temp(Src) 97.6 F (36.4 C) (Oral)  Resp 14  Ht 5' 3.25" (1.607 m)  Wt 139 lb 12 oz (63.39 kg)  BMI 24.55 kg/m2  SpO2 97%  General appearance: alert, cooperative and appears stated age Ears: normal TM's and external ear canals both ears Throat: lips, mucosa, and tongue normal; teeth and gums normal Neck: no adenopathy, no carotid bruit, supple, symmetrical, trachea midline and thyroid not enlarged, symmetric, no tenderness/mass/nodules Back: symmetric, no curvature. ROM normal. No CVA tenderness. Lungs: clear to auscultation bilaterally Heart: regular rate and rhythm, S1, S2 normal, no murmur, click, rub or  gallop Abdomen: soft, non-tender; bowel sounds normal; no masses,  no organomegaly Pulses: 2+ and symmetric Skin: Skin color, texture, turgor normal. No rashes or lesions Lymph nodes: Cervical, supraclavicular, and axillary nodes normal.  Assessment and Plan:  Age-related cognitive decline Her executive skills are intact on MMSE today and clock face drawing was fine,  Reassurance provided.   Lab Results  Component Value Date   TSH 1.34 09/07/2014  '  PMR (polymyalgia rheumatica) Managed with daily prednisone. 65m .  ESR is normal, and she is asymptomatic.  Discussed changing dosing to QOD.   Lab Results  Component Value Date   ESRSEDRATE 17 09/07/2014   Lab Results  Component Value Date   WBC 9.6 09/07/2014   HGB 13.3 09/07/2014   HCT 41.0 09/07/2014   MCV 92.7 09/07/2014   PLT 262.0 09/07/2014     Long-term use of high-risk medication Need to evaluation with DEXA scan due to long term use of prednisone. Serum glucose of 123 noted today , will confirm whether fasting or not.   A total of 45 minutes was spent with patient more than half of which was spent in counseling patient on the above mentioned issues  Updated Medication List Outpatient Encounter Prescriptions as of 09/07/2014  Medication Sig  . aspirin 81 MG tablet Take 81 mg by mouth daily.  . calcium-vitamin D (OSCAL WITH D) 500-200 MG-UNIT per tablet Take 1 tablet by mouth 2 (two) times daily.  .Marland Kitchenlevothyroxine (SYNTHROID, LEVOTHROID) 100 MCG tablet Take 100 mcg by mouth daily before breakfast.  . metoprolol tartrate (LOPRESSOR) 25 MG tablet Take 25 mg by mouth 2 (two) times daily.  .Marland KitchenoxyCODONE-acetaminophen (PERCOCET/ROXICET) 5-325 MG per tablet Take one or 2 tablets every 4-6 hours as needed for postsurgical pain. Do not drive on this medication.  . predniSONE (STERAPRED UNI-PAK) 10 MG tablet Take 10 mg by mouth daily. Tapering dose-now on 140mdaily     Orders Placed This Encounter  Procedures  .  Pneumococcal conjugate vaccine 13-valent  . Comprehensive metabolic panel  . TSH  . Vit D  25 hydroxy (rtn osteoporosis monitoring)  . CBC with Differential  . Sedimentation rate    Return in about 6 months (around 03/08/2015).

## 2014-09-09 ENCOUNTER — Encounter: Payer: Self-pay | Admitting: Internal Medicine

## 2014-09-09 DIAGNOSIS — Z8739 Personal history of other diseases of the musculoskeletal system and connective tissue: Secondary | ICD-10-CM | POA: Insufficient documentation

## 2014-09-09 DIAGNOSIS — Z79899 Other long term (current) drug therapy: Secondary | ICD-10-CM | POA: Insufficient documentation

## 2014-09-09 DIAGNOSIS — R4181 Age-related cognitive decline: Secondary | ICD-10-CM | POA: Insufficient documentation

## 2014-09-09 NOTE — Assessment & Plan Note (Addendum)
Managed with daily prednisone. 38m .  ESR is normal, and she is asymptomatic.  Discussed changing dosing to QOD.   Lab Results  Component Value Date   ESRSEDRATE 17 09/07/2014   Lab Results  Component Value Date   WBC 9.6 09/07/2014   HGB 13.3 09/07/2014   HCT 41.0 09/07/2014   MCV 92.7 09/07/2014   PLT 262.0 09/07/2014

## 2014-09-09 NOTE — Assessment & Plan Note (Addendum)
Her executive skills are intact on MMSE today and clock face drawing was fine,  Reassurance provided.   Lab Results  Component Value Date   TSH 1.34 09/07/2014  '

## 2014-09-09 NOTE — Assessment & Plan Note (Signed)
Need to evaluation with DEXA scan due to long term use of prednisone. Serum glucose of 123 noted today , will confirm whether fasting or not.

## 2014-09-17 NOTE — Telephone Encounter (Signed)
Pt notified, she was not fasting.

## 2014-10-03 ENCOUNTER — Encounter: Payer: Self-pay | Admitting: Internal Medicine

## 2014-10-11 ENCOUNTER — Telehealth: Payer: Self-pay | Admitting: *Deleted

## 2014-10-11 NOTE — Telephone Encounter (Signed)
Pt left VM, needing refills, did not leave on message what Rxs she was needing. Called pt back and left message for what meds need refills.

## 2014-10-12 NOTE — Telephone Encounter (Signed)
Called pt, states she talked to her pharmacy, has refills available and pharmacist told her to contact them when refills needed and they will contact office.

## 2014-11-13 ENCOUNTER — Other Ambulatory Visit: Payer: Self-pay

## 2014-11-13 DIAGNOSIS — D2239 Melanocytic nevi of other parts of face: Secondary | ICD-10-CM | POA: Diagnosis not present

## 2014-11-13 DIAGNOSIS — L821 Other seborrheic keratosis: Secondary | ICD-10-CM | POA: Diagnosis not present

## 2014-11-13 DIAGNOSIS — D225 Melanocytic nevi of trunk: Secondary | ICD-10-CM | POA: Diagnosis not present

## 2014-11-13 DIAGNOSIS — L728 Other follicular cysts of the skin and subcutaneous tissue: Secondary | ICD-10-CM | POA: Diagnosis not present

## 2014-11-13 MED ORDER — LEVOTHYROXINE SODIUM 100 MCG PO TABS: 100.0000 ug | ORAL_TABLET | Freq: Every day | ORAL | Status: DC

## 2014-11-30 ENCOUNTER — Telehealth: Payer: Self-pay | Admitting: Internal Medicine

## 2014-11-30 MED ORDER — PREDNISONE (PAK) 10 MG PO TABS: ORAL_TABLET | Freq: Every day | ORAL | Status: DC

## 2014-11-30 NOTE — Telephone Encounter (Signed)
Chart reviewed,  She has PMR and needs to take 10 mg daily until symptoms resolve, then reduce daily dose to  5 mg daily

## 2014-11-30 NOTE — Telephone Encounter (Signed)
Patient returned call and was notified as directed.

## 2014-11-30 NOTE — Telephone Encounter (Signed)
Tried to call patient - no answer or voice mail.

## 2014-11-30 NOTE — Telephone Encounter (Signed)
Patient called to  Request refill on prednisone for Arthritic pain stated she was given taper to come off but has not able to due to re-occuring stiffness patient taking 5 mg every other day until stiffness returns and reverts to 5 mg daily.

## 2015-03-05 ENCOUNTER — Encounter: Payer: Self-pay | Admitting: Internal Medicine

## 2015-03-05 ENCOUNTER — Ambulatory Visit (INDEPENDENT_AMBULATORY_CARE_PROVIDER_SITE_OTHER): Payer: Medicare Other | Admitting: Internal Medicine

## 2015-03-05 VITALS — BP 126/60 | HR 79 | Temp 97.9°F | Resp 14 | Ht 63.0 in | Wt 143.5 lb

## 2015-03-05 DIAGNOSIS — F09 Unspecified mental disorder due to known physiological condition: Secondary | ICD-10-CM | POA: Diagnosis not present

## 2015-03-05 DIAGNOSIS — R682 Dry mouth, unspecified: Secondary | ICD-10-CM

## 2015-03-05 DIAGNOSIS — M353 Polymyalgia rheumatica: Secondary | ICD-10-CM

## 2015-03-05 DIAGNOSIS — R4181 Age-related cognitive decline: Secondary | ICD-10-CM

## 2015-03-05 DIAGNOSIS — F0789 Other personality and behavioral disorders due to known physiological condition: Secondary | ICD-10-CM

## 2015-03-05 DIAGNOSIS — Z13820 Encounter for screening for osteoporosis: Secondary | ICD-10-CM

## 2015-03-05 DIAGNOSIS — R7301 Impaired fasting glucose: Secondary | ICD-10-CM

## 2015-03-05 DIAGNOSIS — Z79899 Other long term (current) drug therapy: Secondary | ICD-10-CM

## 2015-03-05 DIAGNOSIS — Z8739 Personal history of other diseases of the musculoskeletal system and connective tissue: Secondary | ICD-10-CM

## 2015-03-05 NOTE — Patient Instructions (Signed)
If your labs are normal,  We will decrease your prednisone to 4 mg daily  We will order your bone density test and try to get Prolia ordered to treat your osteoporosis  Return in 6 months

## 2015-03-05 NOTE — Progress Notes (Signed)
Pre-visit discussion using our clinic review tool. No additional management support is needed unless otherwise documented below in the visit note.  

## 2015-03-05 NOTE — Progress Notes (Signed)
Patient ID: ELYSIA Vargas, female   DOB: 10-Apr-1927, 81 y.o.   MRN: [id number]  Patient Active Problem List   Diagnosis Date Noted  . History of osteoporosis 03/06/2015  . Age-related cognitive decline 09/09/2014  . PMR (polymyalgia rheumatica) 09/09/2014  . Long-term use of high-risk medication 09/09/2014    Subjective:  CC:   Chief Complaint  Patient presents with  . Follow-up    6 month general follow up    HPI:   [name] is a 81 y.o. female who presents for  Follow up 6 months.   No complaints   Did not tolerate weaning of prednisone to 5 mg every other day .  Started aching more.   Osteoporosis: previously managed with alendronate may years ago  Stopped it for unclear reasons .   Was also  on Reclast   For 2 or  3  Years  This was most recent use , and was about 3 years ago .  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 Hypertension   . Arthritis   . Carpal tunnel syndrome   . Osteoporosis   . Gait abnormality     has for 81yrno use of cane  . Hypothyroidism   . Wears glasses   . Wears hearing aid     Past Surgical History  Procedure Laterality Date  . Tonsillectomy    . Appendectomy    . Subdural hematoma evacuation via craniotomy  2007  . Colonoscopy    . Carpal tunnel release Left 07/11/2013    Procedure: CARPAL TUNNEL RELEASE;  Surgeon: RCammie Sickle, MD;  [facility]  Service: Orthopedics;  Laterality: Left;       The following portions of the patient's history were reviewed and updated as appropriate: Allergies, current medications, and problem  list.    Review of Systems:   Patient denies headache, fevers, malaise, unintentional weight loss, skin rash, eye pain, sinus congestion and sinus pain, sore throat, dysphagia,  hemoptysis , cough, dyspnea, wheezing, chest pain, palpitations, orthopnea, edema, abdominal pain, nausea, melena, diarrhea, constipation, flank pain, dysuria, hematuria, urinary  Frequency, nocturia, numbness, tingling, seizures,  Focal weakness, Loss of consciousness,  Tremor, insomnia, depression, anxiety, and suicidal ideation.     History   Social History  . Marital Status: Single    Spouse Name: N/A  . Number of Children: N/A  . Years of Education: N/A   Occupational History  . Not on file.   Social History Main Topics  . Smoking status: Never Smoker   . Smokeless tobacco: Not on file  . Alcohol Use: Yes     Comment: occ  . Drug Use: No  . Sexual Activity: Not on file   Other Topics Concern  . Not on file   Social History Narrative    Objective:  Filed Vitals:   03/05/15 1442  BP: 126/60  Pulse: 79  Temp: 97.9 F (36.6 C)  Resp: 14     General appearance: alert, cooperative and appears stated  age Ears: normal TM's and external ear canals both ears Throat: lips, mucosa, and tongue normal; teeth and gums normal Neck: no adenopathy, no carotid bruit, supple, symmetrical, trachea midline and thyroid not enlarged, symmetric, no tenderness/mass/nodules Back: symmetric, no curvature. ROM normal. No CVA tenderness. Lungs: clear to auscultation bilaterally Heart: regular rate and rhythm, S1, S2 normal, no murmur, click, rub or gallop Abdomen: soft, non-tender; bowel sounds normal; no masses,  no organomegaly Pulses: 2+ and symmetric Skin: Skin color, texture, turgor normal. No rashes or lesions Lymph nodes: Cervical, supraclavicular, and axillary nodes normal.  Assessment and Plan:  Age-related cognitive decline She is managing her affairs well but is concerned about her short term  memory and wants to know if she should strart taking medications .  lst MMSE score was 29/30.  NO meds indicated   PMR (polymyalgia rheumatica) Did not tolerate reduction in  dose of prednisone  to 5 mg qod.  Will try 4 mg daily    Long-term use of high-risk medication  liver and kidney function are normal.   Bone Density test ordered  Lab Results  Component Value Date   NA 137 03/05/2015   K 4.7 03/05/2015   CL 104 03/05/2015   CO2 29 03/05/2015   Lab Results  Component Value Date   ALT 14 03/05/2015   AST 18 03/05/2015   ALKPHOS 72 03/05/2015   BILITOT 0.3 03/05/2015   Lab Results  Component Value Date   CREATININE 0.75 03/05/2015      History of osteoporosis Reviewed her prior treatments. Advised her to consider Prolia if DEXA is abnormal.     Updated Medication List Outpatient Encounter Prescriptions as of 03/05/2015  Medication Sig  . aspirin 81 MG tablet Take 81 mg by mouth daily.  [name] levothyroxine (SYNTHROID, LEVOTHROID) 100 MCG tablet Take 1 tablet (100 mcg total) by mouth daily before breakfast.  . metoprolol tartrate (LOPRESSOR) 25 MG tablet Take 25 mg by mouth 2 (two) times daily.  . [DISCONTINUED] predniSONE (STERAPRED UNI-PAK) 10 MG tablet Take by mouth daily. (Patient taking differently: Take 0.5 tablets by mouth daily. )  . [DISCONTINUED] calcium-vitamin D (OSCAL WITH D) 500-200 MG-UNIT per tablet Take 1 tablet by mouth 2 (two) times daily.  . [DISCONTINUED] oxyCODONE-acetaminophen (PERCOCET/ROXICET) 5-325 MG per tablet Take one or 2 tablets every 4-6 hours as needed for postsurgical pain. Do not drive on this medication. (Patient not taking: Reported on 03/05/2015)   No facility-administered encounter medications on file as of 03/05/2015.     Orders Placed This Encounter  Procedures  . DG Bone Density  . Comprehensive metabolic panel  . TSH  . Hemoglobin A1c  . Sjogrens syndrome-A extractable nuclear antibody  . Sjogrens syndrome-B extractable  nuclear antibody  . Sedimentation rate    Return in about 6 months (around 09/05/2015).

## 2015-03-06 ENCOUNTER — Encounter: Payer: Self-pay | Admitting: Internal Medicine

## 2015-03-06 DIAGNOSIS — T380X5A Adverse effect of glucocorticoids and synthetic analogues, initial encounter: Secondary | ICD-10-CM

## 2015-03-06 DIAGNOSIS — M818 Other osteoporosis without current pathological fracture: Secondary | ICD-10-CM | POA: Insufficient documentation

## 2015-03-06 LAB — COMPREHENSIVE METABOLIC PANEL
ALBUMIN: 3.7 g/dL (ref 3.5–5.2)
ALK PHOS: 72 U/L (ref 39–117)
ALT: 14 U/L (ref 0–35)
AST: 18 U/L (ref 0–37)
BILIRUBIN TOTAL: 0.3 mg/dL (ref 0.2–1.2)
BUN: 21 mg/dL (ref 6–23)
CHLORIDE: 104 meq/L (ref 96–112)
CO2: 29 meq/L (ref 19–32)
Calcium: 9.2 mg/dL (ref 8.4–10.5)
Creatinine, Ser: 0.75 mg/dL (ref 0.40–1.20)
GFR: 77.89 mL/min (ref >60.00)
GLUCOSE: 104 mg/dL — AB (ref 70–99)
Potassium: 4.7 mEq/L (ref 3.5–5.1)
Sodium: 137 mEq/L (ref 135–145)
TOTAL PROTEIN: 6.8 g/dL (ref 6.0–8.3)

## 2015-03-06 LAB — SJOGRENS SYNDROME-B EXTRACTABLE NUCLEAR ANTIBODY: SSB (LA) (ENA) ANTIBODY, IGG: NEGATIVE

## 2015-03-06 LAB — SEDIMENTATION RATE: SED RATE: 17 mm/h (ref 0–22)

## 2015-03-06 LAB — HEMOGLOBIN A1C: Hgb A1c MFr Bld: 6 % (ref 4.6–6.5)

## 2015-03-06 LAB — SJOGRENS SYNDROME-A EXTRACTABLE NUCLEAR ANTIBODY: SSA (Ro) (ENA) Antibody, IgG: <1

## 2015-03-06 NOTE — Assessment & Plan Note (Signed)
Did not tolerate reduction in  dose of prednisone  to 5 mg qod.  Will try 4 mg daily

## 2015-03-06 NOTE — Assessment & Plan Note (Signed)
Reviewed her prior treatments. Advised her to consider Prolia if DEXA is abnormal.

## 2015-03-06 NOTE — Assessment & Plan Note (Signed)
She is managing her affairs well but is concerned about her short term memory and wants to know if she should strart taking medications .  lst MMSE score was 29/30.  NO meds indicated

## 2015-03-06 NOTE — Assessment & Plan Note (Addendum)
liver and kidney function are normal.   Bone Density test ordered  Lab Results  Component Value Date   NA 137 03/05/2015   K 4.7 03/05/2015   CL 104 03/05/2015   CO2 29 03/05/2015   Lab Results  Component Value Date   ALT 14 03/05/2015   AST 18 03/05/2015   ALKPHOS 72 03/05/2015   BILITOT 0.3 03/05/2015   Lab Results  Component Value Date   CREATININE 0.75 03/05/2015

## 2015-03-08 ENCOUNTER — Encounter: Payer: Self-pay | Admitting: Internal Medicine

## 2015-04-02 ENCOUNTER — Encounter: Payer: Self-pay | Admitting: Internal Medicine

## 2015-04-03 ENCOUNTER — Ambulatory Visit: Payer: Medicare Other

## 2015-04-04 ENCOUNTER — Ambulatory Visit
Admission: RE | Admit: 2015-04-04 | Discharge: 2015-04-04 | Disposition: A | Discharge status: Home / Self Care | Payer: Medicare Other | Source: Ambulatory Visit | Attending: Internal Medicine | Admitting: Internal Medicine

## 2015-04-04 DIAGNOSIS — M810 Age-related osteoporosis without current pathological fracture: Secondary | ICD-10-CM | POA: Insufficient documentation

## 2015-04-04 DIAGNOSIS — Z13820 Encounter for screening for osteoporosis: Secondary | ICD-10-CM

## 2015-04-06 ENCOUNTER — Encounter: Payer: Self-pay | Admitting: Internal Medicine

## 2015-04-22 DIAGNOSIS — M79675 Pain in left toe(s): Secondary | ICD-10-CM | POA: Diagnosis not present

## 2015-04-22 DIAGNOSIS — L600 Ingrowing nail: Secondary | ICD-10-CM | POA: Diagnosis not present

## 2015-04-22 DIAGNOSIS — B351 Tinea unguium: Secondary | ICD-10-CM | POA: Diagnosis not present

## 2015-04-22 DIAGNOSIS — M79674 Pain in right toe(s): Secondary | ICD-10-CM | POA: Diagnosis not present

## 2015-05-01 ENCOUNTER — Telehealth: Payer: Self-pay | Admitting: Internal Medicine

## 2015-05-01 NOTE — Telephone Encounter (Signed)
[place] Patient Name: [name] DOB: 1927/04/11 Initial Comment Caller states c/o swollen glands in neck, sore spot on roof of mouth Nurse Assessment Nurse: Markus Daft, RN, [place] Date/Time (Eastern Time): 05/01/2015 12:24:41 PM Confirm and document reason for call. If symptomatic, describe symptoms. ---Caller states c/o swollen glands in neck noticed with some slight discomfort in the neck with swallowing felt this AM, and sore spot on roof of mouth for last 2 days. Denies sore throat. No fever. Has the patient traveled out of the country within the last 30 days? ---No Does the patient require triage? ---Yes Related visit to physician within the last 2 weeks? ---No Does the PT have any chronic conditions? (i.e. diabetes, asthma, etc.) ---Yes List chronic conditions. ---In last 3-4 months had a toothache, and had a tooth pulled back then; She was on Prednisone for PMR (Polymyalgia rheumatica) in 2007 and told by rheumatologist to stay on it, and other MD wanted her to be off of it. She weaned down to 2.5 mg daily now. She is on Metoprolol d/t what high doses of Prednisone could do to her and some mild HTN Guidelines Guideline Title Affirmed Question Affirmed Notes Lymph Nodes Swollen [1] Tender node in the neck AND [2] also has a sore throat AND [3] minimal/no runny nose or cough Final Disposition User See Physician within [place], [place], [place] Comments Appt set up for 9:30 am with Dorian Pod, NP. for tomorrow.

## 2015-05-01 NOTE — Telephone Encounter (Signed)
FYI, Seeing a Provider at [facility].

## 2015-05-02 ENCOUNTER — Ambulatory Visit (INDEPENDENT_AMBULATORY_CARE_PROVIDER_SITE_OTHER): Payer: Medicare Other | Admitting: Nurse Practitioner

## 2015-05-02 VITALS — BP 118/60 | HR 67 | Temp 97.7°F | Resp 14 | Ht 63.0 in | Wt 139.8 lb

## 2015-05-02 DIAGNOSIS — R591 Generalized enlarged lymph nodes: Secondary | ICD-10-CM

## 2015-05-02 DIAGNOSIS — M353 Polymyalgia rheumatica: Secondary | ICD-10-CM | POA: Diagnosis not present

## 2015-05-02 DIAGNOSIS — R599 Enlarged lymph nodes, unspecified: Secondary | ICD-10-CM

## 2015-05-02 MED ORDER — PREDNISONE 1 MG PO TABS: 1.0000 mg | ORAL_TABLET | Freq: Two times a day (BID) | ORAL | Status: DC

## 2015-05-02 NOTE — Patient Instructions (Signed)
Continue to follow up with your Dentist on Tuesday.   This should resolve after 7-14 days. If worsens please call.   Take 2 of the 1 mg prednisone for a few weeks then you can try the 1 mg for a few weeks if no symptoms.

## 2015-05-02 NOTE — Progress Notes (Signed)
   Subjective:    Patient ID: [name], female    DOB: 1927/08/26, 81 y.o.   MRN: [id number]  HPI  [name] is a 81 yo female with a CC of adenopathy x 4 days.  1) Swollen glands, sore on roof of mouth  Tooth extracted in [facility] 3.5 months ago   Lower left side sore Subsided during the day, not painful,   Chewing not bothering patient, sore on roof bothering her recently 4 days on right side of mouth  3 weeks of 2.5 mg   Left ear blocked by wax- seeing Dr. Tami Ribas for removal soon  Review of Systems  Constitutional: Negative for fever, chills, diaphoresis and fatigue.  HENT: Positive for mouth sores and rhinorrhea. Negative for congestion, ear discharge, ear pain, sinus pressure, sneezing, sore throat, trouble swallowing and voice change.   Eyes: Negative for visual disturbance.  Respiratory: Negative for cough, chest tightness and wheezing.   Cardiovascular: Negative for chest pain, palpitations and leg swelling.  Gastrointestinal: Negative for nausea, vomiting and diarrhea.  Musculoskeletal: Positive for neck stiffness. Negative for arthralgias and neck pain.       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  Hematological: Positive for adenopathy. Does not bruise/bleed easily.      Objective:   Physical Exam  Constitutional: She is oriented to person, place, and time. She appears well-developed and well-nourished. No distress.  BP 118/60 mmHg  Pulse 67  Temp(Src) 97.7 F (36.5 C)  Resp 14  Ht 5\' 3"  (1.6 m)  Wt 139 lb 12.8 oz (63.413 kg)  BMI 24.77 kg/m2  SpO2 97%   HENT:  Head: Normocephalic and atraumatic.  Right Ear: External ear normal.  Left Ear: External ear normal.  Mouth/Throat: No oropharyngeal exudate.  Aphthous ulcer on roof of mouth  Slightly swollen, tender adenopathy on right  Neck: Normal range of motion. Neck supple. No thyromegaly present.  Cardiovascular: Normal rate, regular rhythm and normal heart sounds.   Exam reveals no gallop and no friction rub.   No murmur heard. Pulmonary/Chest: Effort normal and breath sounds normal. No respiratory distress. She has no wheezes. She has no rales. She exhibits no tenderness.  Lymphadenopathy:    She has cervical adenopathy.  Neurological: She is alert and oriented to person, place, and time. No cranial nerve deficit. She exhibits normal muscle tone. Coordination normal.  Skin: Skin is warm and dry. No rash noted. She is not diaphoretic.  Psychiatric: She has a normal mood and affect. Her behavior is normal. Judgment and thought content normal.      Assessment & Plan:

## 2015-05-03 DIAGNOSIS — K1121 Acute sialoadenitis: Secondary | ICD-10-CM | POA: Diagnosis not present

## 2015-05-15 ENCOUNTER — Encounter: Payer: Self-pay | Admitting: Nurse Practitioner

## 2015-05-15 DIAGNOSIS — R591 Generalized enlarged lymph nodes: Secondary | ICD-10-CM | POA: Insufficient documentation

## 2015-05-15 DIAGNOSIS — R599 Enlarged lymph nodes, unspecified: Secondary | ICD-10-CM | POA: Insufficient documentation

## 2015-05-15 NOTE — Assessment & Plan Note (Signed)
Should resolve on its own, no signs of infection, asked pt to call if not improved in 7-14 days. Pt following up with dentist on Tuesday.

## 2015-05-15 NOTE — Assessment & Plan Note (Signed)
Dr. Derrel Nip was okay with pt going from 2.5 to 2 of the 1 mg tablets for a few weeks then down to 1 mg to continue weaning down.

## 2015-05-16 DIAGNOSIS — K1120 Sialoadenitis, unspecified: Secondary | ICD-10-CM | POA: Diagnosis not present

## 2015-06-11 ENCOUNTER — Telehealth: Payer: Self-pay | Admitting: *Deleted

## 2015-06-11 DIAGNOSIS — M13811 Other specified arthritis, right shoulder: Secondary | ICD-10-CM | POA: Diagnosis not present

## 2015-06-11 DIAGNOSIS — M25511 Pain in right shoulder: Secondary | ICD-10-CM | POA: Diagnosis not present

## 2015-06-11 DIAGNOSIS — M353 Polymyalgia rheumatica: Secondary | ICD-10-CM | POA: Diagnosis not present

## 2015-06-11 NOTE — Telephone Encounter (Signed)
Doe he want the referral to come from [place] since we are the PCP?

## 2015-06-11 NOTE — Telephone Encounter (Signed)
Left detailed message on [name]'s VM

## 2015-06-11 NOTE — Telephone Encounter (Signed)
Dr Mack Guise from [facility] would like to refer pt to Dr Collier Flowers for [place].  Please advise

## 2015-06-12 NOTE — Telephone Encounter (Signed)
Spoke with [facility].  [facility] does not need to make referral as we referred pt to [facility].  They will process referral.

## 2015-06-25 ENCOUNTER — Other Ambulatory Visit: Payer: Self-pay | Admitting: Internal Medicine

## 2015-07-25 DIAGNOSIS — Z23 Encounter for immunization: Secondary | ICD-10-CM | POA: Diagnosis not present

## 2015-08-08 DIAGNOSIS — M353 Polymyalgia rheumatica: Secondary | ICD-10-CM | POA: Diagnosis not present

## 2015-08-08 DIAGNOSIS — M810 Age-related osteoporosis without current pathological fracture: Secondary | ICD-10-CM | POA: Diagnosis not present

## 2015-08-08 DIAGNOSIS — M1990 Unspecified osteoarthritis, unspecified site: Secondary | ICD-10-CM | POA: Diagnosis not present

## 2015-08-09 DIAGNOSIS — M1990 Unspecified osteoarthritis, unspecified site: Secondary | ICD-10-CM | POA: Diagnosis not present

## 2015-08-09 DIAGNOSIS — M353 Polymyalgia rheumatica: Secondary | ICD-10-CM | POA: Diagnosis not present

## 2015-08-20 ENCOUNTER — Telehealth: Payer: Self-pay | Admitting: Internal Medicine

## 2015-08-20 DIAGNOSIS — E559 Vitamin D deficiency, unspecified: Secondary | ICD-10-CM

## 2015-08-20 DIAGNOSIS — M353 Polymyalgia rheumatica: Secondary | ICD-10-CM

## 2015-08-20 DIAGNOSIS — R591 Generalized enlarged lymph nodes: Secondary | ICD-10-CM

## 2015-08-20 DIAGNOSIS — E785 Hyperlipidemia, unspecified: Secondary | ICD-10-CM

## 2015-08-20 DIAGNOSIS — R599 Enlarged lymph nodes, unspecified: Secondary | ICD-10-CM

## 2015-08-20 DIAGNOSIS — Z1159 Encounter for screening for other viral diseases: Secondary | ICD-10-CM

## 2015-08-20 DIAGNOSIS — Z79899 Other long term (current) drug therapy: Secondary | ICD-10-CM

## 2015-08-20 DIAGNOSIS — Z8739 Personal history of other diseases of the musculoskeletal system and connective tissue: Secondary | ICD-10-CM

## 2015-08-20 NOTE — Telephone Encounter (Signed)
Lab scheduled °

## 2015-08-20 NOTE — Telephone Encounter (Signed)
Thank you!  Finished ordering

## 2015-08-20 NOTE — Telephone Encounter (Signed)
Patient wanting labs before OV i entered CBC,TSH, CMET, A1c  I did not add lipid due to I saw no lipids ordered previously? Anything else needed?

## 2015-09-10 DIAGNOSIS — M353 Polymyalgia rheumatica: Secondary | ICD-10-CM | POA: Diagnosis not present

## 2015-09-10 DIAGNOSIS — M1990 Unspecified osteoarthritis, unspecified site: Secondary | ICD-10-CM | POA: Diagnosis not present

## 2015-09-12 DIAGNOSIS — M353 Polymyalgia rheumatica: Secondary | ICD-10-CM | POA: Diagnosis not present

## 2015-09-12 DIAGNOSIS — M150 Primary generalized (osteo)arthritis: Secondary | ICD-10-CM | POA: Diagnosis not present

## 2015-09-12 DIAGNOSIS — M810 Age-related osteoporosis without current pathological fracture: Secondary | ICD-10-CM | POA: Diagnosis not present

## 2015-09-13 ENCOUNTER — Other Ambulatory Visit (INDEPENDENT_AMBULATORY_CARE_PROVIDER_SITE_OTHER): Payer: Medicare Other

## 2015-09-13 DIAGNOSIS — Z1159 Encounter for screening for other viral diseases: Secondary | ICD-10-CM | POA: Diagnosis not present

## 2015-09-13 DIAGNOSIS — M353 Polymyalgia rheumatica: Secondary | ICD-10-CM | POA: Diagnosis not present

## 2015-09-13 DIAGNOSIS — Z79899 Other long term (current) drug therapy: Secondary | ICD-10-CM

## 2015-09-13 DIAGNOSIS — E785 Hyperlipidemia, unspecified: Secondary | ICD-10-CM

## 2015-09-13 LAB — LIPID PANEL
CHOL/HDL RATIO: 4
Cholesterol: 216 mg/dL — ABNORMAL HIGH (ref 0–200)
HDL: 56 mg/dL (ref >39.00)
LDL CALC: 130 mg/dL — AB (ref 0–99)
NONHDL: 159.66
Triglycerides: 148 mg/dL (ref 0.0–149.0)
VLDL: 29.6 mg/dL (ref 0.0–40.0)

## 2015-09-13 LAB — CBC WITH DIFFERENTIAL/PLATELET
BASOS PCT: 0.7 % (ref 0.0–3.0)
Basophils Absolute: 0.1 10*3/uL (ref 0.0–0.1)
EOS PCT: 2.8 % (ref 0.0–5.0)
Eosinophils Absolute: 0.3 10*3/uL (ref 0.0–0.7)
HCT: 40.6 % (ref 36.0–46.0)
Hemoglobin: 13.2 g/dL (ref 12.0–15.0)
LYMPHS ABS: 2 10*3/uL (ref 0.7–4.0)
Lymphocytes Relative: 22.5 % (ref 12.0–46.0)
MCHC: 32.6 g/dL (ref 30.0–36.0)
MCV: 91.7 fl (ref 78.0–100.0)
MONO ABS: 0.6 10*3/uL (ref 0.1–1.0)
Monocytes Relative: 6.9 % (ref 3.0–12.0)
NEUTROS ABS: 6 10*3/uL (ref 1.4–7.7)
NEUTROS PCT: 67.1 % (ref 43.0–77.0)
PLATELETS: 257 10*3/uL (ref 150.0–400.0)
RBC: 4.43 Mil/uL (ref 3.87–5.11)
RDW: 12.9 % (ref 11.5–15.5)
WBC: 8.9 10*3/uL (ref 4.0–10.5)

## 2015-09-13 LAB — TSH: TSH: 1.97 u[IU]/mL (ref 0.35–4.50)

## 2015-09-13 LAB — COMPREHENSIVE METABOLIC PANEL
ALK PHOS: 71 U/L (ref 39–117)
ALT: 14 U/L (ref 0–35)
AST: 20 U/L (ref 0–37)
Albumin: 3.9 g/dL (ref 3.5–5.2)
BUN: 16 mg/dL (ref 6–23)
CO2: 29 meq/L (ref 19–32)
Calcium: 9.5 mg/dL (ref 8.4–10.5)
Chloride: 105 mEq/L (ref 96–112)
Creatinine, Ser: 0.84 mg/dL (ref 0.40–1.20)
GFR: 68.26 mL/min (ref >60.00)
GLUCOSE: 105 mg/dL — AB (ref 70–99)
POTASSIUM: 4.3 meq/L (ref 3.5–5.1)
SODIUM: 141 meq/L (ref 135–145)
TOTAL PROTEIN: 6.8 g/dL (ref 6.0–8.3)
Total Bilirubin: 0.6 mg/dL (ref 0.2–1.2)

## 2015-09-13 LAB — SEDIMENTATION RATE: SED RATE: 14 mm/h (ref 0–22)

## 2015-09-13 LAB — HEMOGLOBIN A1C: HEMOGLOBIN A1C: 5.8 % (ref 4.6–6.5)

## 2015-09-14 LAB — HEPATITIS C ANTIBODY: HCV AB: NEGATIVE

## 2015-09-16 ENCOUNTER — Ambulatory Visit
Admission: RE | Admit: 2015-09-16 | Discharge: 2015-09-16 | Disposition: A | Discharge status: Home / Self Care | Payer: Medicare Other | Source: Ambulatory Visit | Attending: Internal Medicine | Admitting: Internal Medicine

## 2015-09-16 ENCOUNTER — Ambulatory Visit (INDEPENDENT_AMBULATORY_CARE_PROVIDER_SITE_OTHER): Payer: Medicare Other | Admitting: Internal Medicine

## 2015-09-16 ENCOUNTER — Encounter: Payer: Self-pay | Admitting: Internal Medicine

## 2015-09-16 VITALS — BP 116/64 | HR 85 | Temp 97.9°F | Resp 12 | Ht 63.0 in | Wt 143.8 lb

## 2015-09-16 DIAGNOSIS — R194 Change in bowel habit: Secondary | ICD-10-CM | POA: Diagnosis not present

## 2015-09-16 DIAGNOSIS — M353 Polymyalgia rheumatica: Secondary | ICD-10-CM | POA: Diagnosis not present

## 2015-09-16 DIAGNOSIS — R1013 Epigastric pain: Secondary | ICD-10-CM | POA: Insufficient documentation

## 2015-09-16 MED ORDER — FAMOTIDINE 20 MG PO TABS: 20.0000 mg | ORAL_TABLET | Freq: Two times a day (BID) | ORAL | Status: DC

## 2015-09-16 MED ORDER — TETANUS-DIPHTH-ACELL PERTUSSIS 5-2.5-18.5 LF-MCG/0.5 IM SUSP: 0.5000 mL | Freq: Once | INTRAMUSCULAR | Status: DC

## 2015-09-16 NOTE — Assessment & Plan Note (Signed)
reassured her that her PMR is quiescent,  Given low ESR .  OA more prominent.

## 2015-09-16 NOTE — Assessment & Plan Note (Signed)
History suggests obstipation ,  Plain films ordered to rule out bowel gas pattern,

## 2015-09-16 NOTE — Progress Notes (Signed)
Pre-visit discussion using our clinic review tool. No additional management support is needed unless otherwise documented below in the visit note.  

## 2015-09-16 NOTE — Progress Notes (Signed)
Subjective:  Patient ID: [name], female    DOB: 17-May-1927  Age: 81 y.o. MRN: [id number]  CC: The primary encounter diagnosis was Change in bowel habits. Diagnoses of Abdominal pain, epigastric and PMR (polymyalgia rheumatica) (HCC) were also pertinent to this visit.  HPI [name] presents for multiple issues   1) recent change in BM.  Has not had a normal one in the past week.  Having small caliber  Stools and small pebbles.  Today has been having a liquid stool on and off today . [name]  2) Episodes of severe epigastric pain  That lasts for < 1 hour ,  No change with belching or passing gas   3)Joint pain prednisne dose reduced to 3 mg daily and feels more stiff,  .  Review of labs with normal ESR.   4) Trouble with balance ,  Has fallen and scraped legs . Trouble getting up without assistance   5) Hypertension:  She has self  adjusted her metoprolol to once daily   Outpatient Prescriptions Prior to Visit  Medication Sig Dispense Refill  . aspirin 81 MG tablet Take 81 mg by mouth daily.    [name] levothyroxine (SYNTHROID, LEVOTHROID) 100 MCG tablet TAKE 1 TABLET BY MOUTH ONCE DAILY ON AN EMPTY STOMACH. WAIT 30 MINUTES BEFORE TAKING OTHER MEDS. 30 tablet 6  . metoprolol tartrate (LOPRESSOR) 25 MG tablet Take 25 mg by mouth daily.     . predniSONE (DELTASONE) 1 MG tablet Take 1 tablet (1 mg total) by mouth 2 (two) times daily with a meal. 60 tablet 1   No facility-administered medications prior to visit.    Review of Systems;  Patient denies headache, fevers, malaise, unintentional weight loss, skin rash, eye pain, sinus congestion and sinus pain, sore throat, dysphagia,  hemoptysis , cough, dyspnea, wheezing, chest pain, palpitations, orthopnea, edema, abdominal pain, nausea, melena, diarrhea, constipation, flank pain, dysuria, hematuria, urinary  Frequency, nocturia, numbness, tingling, seizures,  Focal weakness, Loss of consciousness,  Tremor, insomnia, depression, anxiety, and  suicidal ideation.      Objective:  BP 116/64 mmHg  Pulse 85  Temp(Src) 97.9 F (36.6 C) (Oral)  Resp 12  Ht '5\' 3"'  (1.6 m)  Wt 143 lb 12 oz (65.205 kg)  BMI 25.47 kg/m2  SpO2 98%  BP Readings from Last 3 Encounters:  09/16/15 116/64  05/02/15 118/60  03/05/15 126/60    Wt Readings from Last 3 Encounters:  09/16/15 143 lb 12 oz (65.205 kg)  05/02/15 139 lb 12.8 oz (63.413 kg)  03/05/15 143 lb 8 oz (65.091 kg)    General appearance: alert, cooperative and appears stated age Ears: normal TM's and external ear canals both ears Throat: lips, mucosa, and tongue normal; teeth and gums normal Neck: no adenopathy, no carotid bruit, supple, symmetrical, trachea midline and thyroid not enlarged, symmetric, no tenderness/mass/nodules Back: symmetric, no curvature. ROM normal. No CVA tenderness. Lungs: clear to auscultation bilaterally Heart: regular rate and rhythm, S1, S2 normal, no murmur, click, rub or gallop Abdomen: soft, non-tender; bowel sounds normal; no masses,  no organomegaly Pulses: 2+ and symmetric Skin: Skin color, texture, turgor normal. No rashes or lesions Lymph nodes: Cervical, supraclavicular, and axillary nodes normal.  Lab Results  Component Value Date   HGBA1C 5.8 09/13/2015   HGBA1C 6.0 03/05/2015    Lab Results  Component Value Date   CREATININE 0.84 09/13/2015   CREATININE 0.75 03/05/2015   CREATININE 0.9 09/07/2014    Lab Results  Component Value Date   WBC 8.9 09/13/2015   HGB 13.2 09/13/2015   HCT 40.6 09/13/2015   PLT 257.0 09/13/2015   GLUCOSE 105* 09/13/2015   CHOL 216* 09/13/2015   TRIG 148.0 09/13/2015   HDL 56.00 09/13/2015   LDLCALC 130* 09/13/2015   ALT 14 09/13/2015   AST 20 09/13/2015   NA 141 09/13/2015   K 4.3 09/13/2015   CL 105 09/13/2015   CREATININE 0.84 09/13/2015   BUN 16 09/13/2015   CO2 29 09/13/2015   TSH 1.97 09/13/2015   HGBA1C 5.8 09/13/2015    Dg Bone Density  04/04/2015  EXAM: DUAL X-RAY  ABSORPTIOMETRY (DXA) FOR BONE MINERAL DENSITY IMPRESSION: Dear Dr. Derrel Nip, Your patient [name] completed a BMD test on 04/04/2015 using the [place] (analysis version: 14.10) manufactured by [employer]. The following summarizes the results of our evaluation. PATIENT BIOGRAPHICAL: Name: Mary, Vargas Patient ID: [id number] Birth Date: [birth year] Height: 62.0 in. Gender: Female Exam Date: 04/04/2015 Weight: 141.5 lbs. Indications: Advanced Age, Caucasian, Height Loss, High Risk Meds, Long term prednisone use, Postmenopausal Fractures: Treatments: ASPRIN 81 MG, LEVOTHYROXINE, prednisone ASSESSMENT: The BMD measured at Femur Neck Left is 0.678 g/cm2 with a T-score of -2.6. This patient is considered osteoporotic according to [facility]) criteria. Site Region Measured Measured WHO Young Adult BMD Date       Age      Classification T-score AP Spine L1-L4 04/04/2015 86.0 Osteopenia -2.0 0.949 g/cm2 DualFemur Neck Left 04/04/2015 86.0 Osteoporosis -2.6 0.678 g/cm2 World Health Organization [facility]) criteria for post-menopausal, Caucasian Women: Normal:       T-score at or above -1 SD Osteopenia:   T-score between -1 and -2.5 SD Osteoporosis: T-score at or below -2.5 SD RECOMMENDATIONS: [place] recommends that FDA-approved medical therapies be considered in postmenopausal women and men age 26 or older with a: 1. Hip or vertebral (clinical or morphometric) fracture. 2. T-score of < -2.5 at the spine or hip. 3. Ten-year fracture probability by FRAX of 3% or greater for hip fracture or 20% or greater for major osteoporotic fracture. All treatment decisions require clinical judgment and consideration of individual patient factors, including patient preferences, co-morbidities, previous drug use, risk factors not captured in the FRAX model (e.g. falls, vitamin D deficiency, increased bone turnover, interval significant decline in bone density) and possible under - or  over-estimation of fracture risk by FRAX. All patients should ensure an adequate intake of dietary calcium (1200 mg/d) and vitamin D (800 IU daily) unless contraindicated. FOLLOW-UP: People with diagnosed cases of osteoporosis or at high risk for fracture should have regular bone mineral density tests. For patients eligible for Medicare, routine testing is allowed once every 2 years. The testing frequency can be increased to one year for patients who have rapidly progressing disease, those who are receiving or discontinuing medical therapy to restore bone mass, or have additional risk factors. I have reviewed this report, and agree with the above findings. [facility] Electronically Signed   By: David  Martinique M.D.   On: 04/04/2015 13:43    Assessment & Plan:   Problem List Items Addressed This Visit    PMR (polymyalgia rheumatica) ([place])    reassured her that her PMR is quiescent,  Given low ESR .  OA more prominent.       Change in bowel habits - Primary    History suggests obstipation ,  Plain films ordered to rule out bowel gas pattern,  Relevant Orders   DG Abd 2 Views (Completed)   Abdominal pain, epigastric    Episodes last  < 1 hour and are not related to a meal.  Prn famotidine recommended . Will consider UGI.SBFt if symptoms persist.       Relevant Orders   DG Abd 2 Views (Completed)      I am having Ms. Siharath start on famotidine and Tdap. I am also having her maintain her aspirin, metoprolol tartrate, predniSONE, and levothyroxine.  Meds ordered this encounter  Medications  . famotidine (PEPCID) 20 MG tablet    Sig: Take 1 tablet (20 mg total) by mouth 2 (two) times daily.    Dispense:  60 tablet    Refill:  3  . Tdap (BOOSTRIX) 5-2.5-18.5 LF-MCG/0.5 injection    Sig: Inject 0.5 mLs into the muscle once.    Dispense:  0.5 mL    Refill:  0    There are no discontinued medications.  Follow-up: Return in about 4 weeks (around 10/14/2015).   Crecencio Mc, MD

## 2015-09-16 NOTE — Assessment & Plan Note (Signed)
Episodes last  < 1 hour and are not related to a meal.  Prn famotidine recommended . Will consider UGI.SBFt if symptoms persist.

## 2015-09-16 NOTE — Patient Instructions (Addendum)
I am ordering plain x rays of your abdomen  To rule out constipation  As the cause of your change in caliber of stools  I am starting you on famotidine 2 times daily for your epigastric pain  Please change your metoprolol dose to 1/2 tablet twice daily  Instead of one tablet once daily   Return in one month  You need to strengthen  your buttock and hip muscles.    Exercises for your balance include strengthening those muscles in your core that help keep you from falling :  1) Practice standing on one leg for 10 seconds,  Switch sides.[name]  2) Hip Raises:   Stand on one leg next the wall.  Let the opposite hip  Drop (keep leg bent and suspended,  Foot not touching ground).  The use the buttock muscles on the side closest to the wall to lift the sagging hip up .  Repeat ten times per side   3) Practice getting up from a chair 10 times.  Repeat these exercises 3 times daily

## 2015-09-17 MED ORDER — LACTULOSE 20 GM/30ML PO SOLN: ORAL | Status: DC

## 2015-09-17 NOTE — Addendum Note (Signed)
Addended by: Crecencio Mc on: 09/17/2015 09:22 AM   Modules accepted: Orders

## 2015-09-18 ENCOUNTER — Other Ambulatory Visit: Payer: Self-pay | Admitting: Internal Medicine

## 2015-09-18 MED ORDER — PEG 3350-KCL-NA BICARB-NACL 420 G PO SOLR: ORAL | Status: DC

## 2015-10-14 ENCOUNTER — Ambulatory Visit (INDEPENDENT_AMBULATORY_CARE_PROVIDER_SITE_OTHER): Payer: Medicare Other | Admitting: Internal Medicine

## 2015-10-14 ENCOUNTER — Encounter: Payer: Self-pay | Admitting: Internal Medicine

## 2015-10-14 VITALS — BP 144/70 | HR 79 | Temp 98.1°F | Resp 14 | Ht 63.0 in | Wt 141.5 lb

## 2015-10-14 DIAGNOSIS — R194 Change in bowel habit: Secondary | ICD-10-CM | POA: Diagnosis not present

## 2015-10-14 DIAGNOSIS — B9789 Other viral agents as the cause of diseases classified elsewhere: Principal | ICD-10-CM

## 2015-10-14 DIAGNOSIS — J069 Acute upper respiratory infection, unspecified: Secondary | ICD-10-CM

## 2015-10-14 NOTE — Patient Instructions (Signed)
Your have a resolving viral infection .  To help resolve your congestion I am recommending a prednisone taper.   If you have 21  Tablets of the 10 mg prednisone  Prescription ,  You can start a 6 day taper starting with 6 tablets on Day 1 and reduce by 1 tablet every day (6-5-4-3-2-1 , then resume your previous daily dose).  If you only have 15,  Then start with 5 tablets on Day 1  (5-4-3-2-1- etc   You can use Delsym or Robitussin for cough   Continue an antihistamine for runny nose   Gargle with salt water , and rest your voice   . I am sending out a referral for "Cologuard" to screen you for colon Cancer.  It will be sent to your  Home from the company once your insurance authorizes it  .  It

## 2015-10-14 NOTE — Progress Notes (Addendum)
Subjective:  Patient ID: [name], female    DOB: Dec 18, 1926  Age: 81 y.o. MRN: [id number]  CC: The primary encounter diagnosis was Viral URI with cough. A diagnosis of Change in bowel habits was also pertinent to this visit.  HPI [name] presents for evaluation of :  1) upper respiratory symptoms that have been present for 6 or 7 days .  Cough is nonproductive,  ,  , no dyspnea pleurisy, or fevers.  She has  lost her voice   Discussed prednisone taper   2) Chronic symptoms of loose stools alternating with large stools.   Has been chronic for years.   At last visit constipation  was suggested by plain films.  Took the lactulose  Until the stools became clear.   Has only had one loose bowel movement. Since late November. Stools are now solid,  Occurring daily or mostly daily.  Feels that this is strange. No blood in stools, no unintentional weight loss.  Wants tot have a colonoscopy.  Discussed cologuard    Outpatient Prescriptions Prior to Visit  Medication Sig Dispense Refill  . aspirin 81 MG tablet Take 81 mg by mouth daily.    [name] levothyroxine (SYNTHROID, LEVOTHROID) 100 MCG tablet TAKE 1 TABLET BY MOUTH ONCE DAILY ON AN EMPTY STOMACH. WAIT 30 MINUTES BEFORE TAKING OTHER MEDS. 30 tablet 6  . predniSONE (DELTASONE) 1 MG tablet Take 1 tablet (1 mg total) by mouth 2 (two) times daily with a meal. (Patient not taking: Reported on 11/28/2015) 60 tablet 1  . metoprolol tartrate (LOPRESSOR) 25 MG tablet Take 25 mg by mouth daily.     . famotidine (PEPCID) 20 MG tablet Take 1 tablet (20 mg total) by mouth 2 (two) times daily. (Patient not taking: Reported on 11/28/2015) 60 tablet 3  . Lactulose 20 GM/30ML SOLN 30 ml every 4 hours until constipation is relieved (Patient not taking: Reported on 10/14/2015) 236 mL 3  . polyethylene glycol-electrolytes (GAVILYTE-N WITH FLAVOR PACK) 420 G solution Drink an 8 ounce glass every 30 minutes until your stool becomes clear (Patient not taking:  Reported on 10/14/2015) 4000 mL 0  . Tdap (BOOSTRIX) 5-2.5-18.5 LF-MCG/0.5 injection Inject 0.5 mLs into the muscle once. (Patient not taking: Reported on 10/14/2015) 0.5 mL 0   No facility-administered medications prior to visit.    Review of Systems;  Patient denies headache, fevers, malaise, unintentional weight loss, skin rash, eye pain,  sore throat, dysphagia,  hemoptysis , , dyspnea, wheezing, chest pain, palpitations, orthopnea, edema, abdominal pain, nausea, melena, diarrhea, constipation, flank pain, dysuria, hematuria, urinary  Frequency, nocturia, numbness, tingling, seizures,  Focal weakness, Loss of consciousness,  Tremor, insomnia, depression, anxiety, and suicidal ideation.      Objective:  BP 144/70 mmHg  Pulse 79  Temp(Src) 98.1 F (36.7 C) (Oral)  Resp 14  Ht 5\' 3"  (1.6 m)  Wt 141 lb 8 oz (64.184 kg)  BMI 25.07 kg/m2  SpO2 99%  BP Readings from Last 3 Encounters:  11/28/15 138/72  10/14/15 144/70  09/16/15 116/64    Wt Readings from Last 3 Encounters:  11/28/15 143 lb 8 oz (65.091 kg)  10/14/15 141 lb 8 oz (64.184 kg)  09/16/15 143 lb 12 oz (65.205 kg)    General appearance: alert, cooperative and appears stated age Ears: normal TM's and external ear canals both ears Throat: lips, mucosa, and tongue normal; teeth and gums normal Neck: no adenopathy, no carotid bruit, supple, symmetrical, trachea midline and  thyroid not enlarged, symmetric, no tenderness/mass/nodules Back: symmetric, no curvature. ROM normal. No CVA tenderness. Lungs: clear to auscultation bilaterally Heart: regular rate and rhythm, S1, S2 normal, no murmur, click, rub or gallop Abdomen: soft, non-tender; bowel sounds normal; no masses,  no organomegaly Pulses: 2+ and symmetric Skin: Skin color, texture, turgor normal. No rashes or lesions Lymph nodes: Cervical, supraclavicular, and axillary nodes normal.  Lab Results  Component Value Date   HGBA1C 5.8 09/13/2015   HGBA1C 6.0  03/05/2015    Lab Results  Component Value Date   CREATININE 0.84 09/13/2015   CREATININE 0.75 03/05/2015   CREATININE 0.9 09/07/2014    Lab Results  Component Value Date   WBC 8.9 09/13/2015   HGB 13.2 09/13/2015   HCT 40.6 09/13/2015   PLT 257.0 09/13/2015   GLUCOSE 105* 09/13/2015   CHOL 216* 09/13/2015   TRIG 148.0 09/13/2015   HDL 56.00 09/13/2015   LDLCALC 130* 09/13/2015   ALT 14 09/13/2015   AST 20 09/13/2015   NA 141 09/13/2015   K 4.3 09/13/2015   CL 105 09/13/2015   CREATININE 0.84 09/13/2015   BUN 16 09/13/2015   CO2 29 09/13/2015   TSH 1.97 09/13/2015   HGBA1C 5.8 09/13/2015    Dg Abd 2 Views  09/16/2015  CLINICAL DATA:  Epigastric pain. Change in stool habits. Question constipation. EXAM: ABDOMEN - 2 VIEW COMPARISON:  None. FINDINGS: Moderate stool burden throughout the colon. There is normal bowel gas pattern. No free air. No organomegaly or suspicious calcification. No acute bony abnormality. IMPRESSION: Moderate stool burden.  No acute findings. Electronically Signed   By: Rolm Baptise M.D.   On: 09/16/2015 16:39    Assessment & Plan:   Problem List Items Addressed This Visit    Change in bowel habits    Improved after treatment of obstipation, with no warning signs that merit subjecting her to risk of colonoscopy.  Cologuard ordered.       Viral URI with cough - Primary    URI is most likely viral given the mild HEENT  Symptoms  And normal exam.   I have explained that in viral URIS, an antibiotic will not help the symptoms and will increase the risk of developing diarrhea.,  Continue oral and nasal decongestants, and tylenol 650 mq 8 hrs for aches and pains,  And will prednisone  taper for inflammation         I am having [name] maintain her aspirin, predniSONE, levothyroxine, famotidine, Tdap, Lactulose, polyethylene glycol-electrolytes, and Phenylephrine-DM-GG-APAP.  Meds ordered this encounter  Medications  . Phenylephrine-DM-GG-APAP  (TYLENOL COLD/FLU SEVERE) 5-10-200-325 MG TABS    Sig: Take 1 tablet by mouth 2 (two) times daily. Reported on 11/28/2015    There are no discontinued medications.  Follow-up: No Follow-up on file.   Crecencio Mc, MD

## 2015-10-14 NOTE — Progress Notes (Signed)
Pre-visit discussion using our clinic review tool. No additional management support is needed unless otherwise documented below in the visit note.  

## 2015-10-15 DIAGNOSIS — J069 Acute upper respiratory infection, unspecified: Secondary | ICD-10-CM | POA: Insufficient documentation

## 2015-10-15 DIAGNOSIS — B9789 Other viral agents as the cause of diseases classified elsewhere: Principal | ICD-10-CM

## 2015-10-15 NOTE — Assessment & Plan Note (Signed)
Improved after treatment of obstipation, with no warning signs that merit subjecting her to risk of colonoscopy.  Cologuard ordered.

## 2015-10-15 NOTE — Assessment & Plan Note (Signed)
URI is most likely viral given the mild HEENT  Symptoms  And normal exam.   I have explained that in viral URIS, an antibiotic will not help the symptoms and will increase the risk of developing diarrhea.,  Continue oral and nasal decongestants, and tylenol 650 mq 8 hrs for aches and pains,  And will prednisone  taper for inflammation 

## 2015-10-18 ENCOUNTER — Telehealth: Payer: Self-pay

## 2015-10-18 NOTE — Telephone Encounter (Signed)
Request fax from [facility] for Metoprolol states take 1 1/2 tablets ( 37.5 mg) twice a day On medication RX order list Metoprolol 25 mg is listed as once daily Clarified with Dr. [name] patient and left voice mail to call back to clarify the dosage that she is taking

## 2015-10-22 NOTE — Telephone Encounter (Signed)
Carilyn Goodpasture, RN at 10/22/2015 8:31 AM     Status: Signed       Expand All Collapse All   I am not in office this week. Routed to Dr. Derrel Nip so her nurse could address issue about metoprolol dose:            Carilyn Goodpasture, RN at 10/18/2015 12:11 PM     Status: Signed       Expand All Collapse All   Request fax from [facility] for Metoprolol states take 1 1/2 tablets ( 37.5 mg) twice a day On medication RX order list Metoprolol 25 mg is listed as once daily Clarified with Dr. [name] patient and left voice mail to call back to clarify the dosage that she is taking

## 2015-10-22 NOTE — Telephone Encounter (Signed)
I am not in office this week.  Routed to Dr. Derrel Nip so her nurse could address issue about metoprolol dose:

## 2015-10-24 NOTE — Telephone Encounter (Signed)
Request received on the triage phone for refill of Metoprolol.  Called patient to clarify what dose she is taking.  She stated she takes 12.5mg  in the morning and 12.5mg  in the evening.  A total of 25mg  a day.  Please advise as pharmacy stated she is taking 37.5mg  twice a day.  Patient states you discussed this?

## 2015-10-25 MED ORDER — METOPROLOL TARTRATE 25 MG PO TABS: 12.5000 mg | ORAL_TABLET | Freq: Two times a day (BID) | ORAL | Status: DC

## 2015-10-25 NOTE — Telephone Encounter (Signed)
12.5 mg bid .  rx sent for 90 days

## 2015-10-25 NOTE — Addendum Note (Signed)
Addended by: Crecencio Mc on: 10/25/2015 07:15 AM   Modules accepted: Orders

## 2015-10-29 DIAGNOSIS — H6503 Acute serous otitis media, bilateral: Secondary | ICD-10-CM | POA: Diagnosis not present

## 2015-10-29 DIAGNOSIS — H6123 Impacted cerumen, bilateral: Secondary | ICD-10-CM | POA: Diagnosis not present

## 2015-11-25 DIAGNOSIS — I10 Essential (primary) hypertension: Secondary | ICD-10-CM | POA: Diagnosis not present

## 2015-11-28 ENCOUNTER — Telehealth: Payer: Self-pay | Admitting: Internal Medicine

## 2015-11-28 ENCOUNTER — Encounter: Payer: Self-pay | Admitting: Internal Medicine

## 2015-11-28 ENCOUNTER — Ambulatory Visit (INDEPENDENT_AMBULATORY_CARE_PROVIDER_SITE_OTHER): Payer: Medicare Other | Admitting: Internal Medicine

## 2015-11-28 VITALS — BP 138/72 | HR 64 | Temp 97.2°F | Resp 12 | Ht 63.0 in | Wt 143.5 lb

## 2015-11-28 DIAGNOSIS — M818 Other osteoporosis without current pathological fracture: Secondary | ICD-10-CM

## 2015-11-28 DIAGNOSIS — M19011 Primary osteoarthritis, right shoulder: Secondary | ICD-10-CM | POA: Diagnosis not present

## 2015-11-28 DIAGNOSIS — E559 Vitamin D deficiency, unspecified: Secondary | ICD-10-CM

## 2015-11-28 DIAGNOSIS — T380X5A Adverse effect of glucocorticoids and synthetic analogues, initial encounter: Secondary | ICD-10-CM

## 2015-11-28 DIAGNOSIS — I10 Essential (primary) hypertension: Secondary | ICD-10-CM

## 2015-11-28 DIAGNOSIS — M353 Polymyalgia rheumatica: Secondary | ICD-10-CM

## 2015-11-28 DIAGNOSIS — M19012 Primary osteoarthritis, left shoulder: Secondary | ICD-10-CM

## 2015-11-28 LAB — VITAMIN D 25 HYDROXY (VIT D DEFICIENCY, FRACTURES): VITD: 25.88 ng/mL — ABNORMAL LOW (ref 30.00–100.00)

## 2015-11-28 NOTE — Addendum Note (Signed)
Addended by: Crecencio Mc on: 11/28/2015 09:42 AM   Modules accepted: Miquel Dunn

## 2015-11-28 NOTE — Progress Notes (Signed)
Subjective:  Patient ID: [name], female    DOB: 16-Aug-1927  Age: 82 y.o. MRN: [id number]  CC: The primary encounter diagnosis was Vitamin D deficiency. Diagnoses of Osteoarthritis of both shoulders, unspecified osteoarthritis type, Steroid-induced osteoporosis, Essential hypertension, and PMR (polymyalgia rheumatica) (HCC) were also pertinent to this visit.  HPI [name] presents for follow p on multiple issues.  1) elevated blood pressure,.  Several outside readings have been elevated to a systolic of [id number]. She recently received a long steroid taper for sinusitis at the end of December which she finished two weeks ago.   She has been taking metoprolol 5 0 mg bid for several weeks.   PMR:  Has been having persistent Shoulder pain, had rheumatology evaluation Oct 13th,  Inflammatory markers were negative.  5 mg prednisone dose was addressed and  gradual reduction by 1 mg/month recommended.  She had discontinued all prednisone until she was treated for sinusitis  In late December   Has appt feb 9th  but going to postpone since her last dose of prednisone 2 weeks ago .   She continues to defer prolia for treatment of osteoporosis.  history of esophagitis   Constipation, chronic did not use the lactulose I sent in in November bc "I go regularly"   Eats prunes daily  .       Outpatient Prescriptions Prior to Visit  Medication Sig Dispense Refill  . aspirin 81 MG tablet Take 81 mg by mouth daily.    [name] levothyroxine (SYNTHROID, LEVOTHROID) 100 MCG tablet TAKE 1 TABLET BY MOUTH ONCE DAILY ON AN EMPTY STOMACH. WAIT 30 MINUTES BEFORE TAKING OTHER MEDS. 30 tablet 6  . metoprolol tartrate (LOPRESSOR) 25 MG tablet Take 0.5 tablets (12.5 mg total) by mouth 2 (two) times daily. 45 tablet 4  . famotidine (PEPCID) 20 MG tablet Take 1 tablet (20 mg total) by mouth 2 (two) times daily. (Patient not taking: Reported on 11/28/2015) 60 tablet 3  . Lactulose 20 GM/30ML SOLN 30 ml every 4 hours until  constipation is relieved (Patient not taking: Reported on 10/14/2015) 236 mL 3  . Phenylephrine-DM-GG-APAP (TYLENOL COLD/FLU SEVERE) 5-10-200-325 MG TABS Take 1 tablet by mouth 2 (two) times daily. Reported on 11/28/2015    . polyethylene glycol-electrolytes (GAVILYTE-N WITH FLAVOR PACK) 420 G solution Drink an 8 ounce glass every 30 minutes until your stool becomes clear (Patient not taking: Reported on 10/14/2015) 4000 mL 0  . Tdap (BOOSTRIX) 5-2.5-18.5 LF-MCG/0.5 injection Inject 0.5 mLs into the muscle once. (Patient not taking: Reported on 10/14/2015) 0.5 mL 0  . predniSONE (DELTASONE) 1 MG tablet Take 1 tablet (1 mg total) by mouth 2 (two) times daily with a meal. (Patient not taking: Reported on 11/28/2015) 60 tablet 1   No facility-administered medications prior to visit.    Review of Systems;  Patient denies headache, fevers, malaise, unintentional weight loss, skin rash, eye pain, sinus congestion and sinus pain, sore throat, dysphagia,  hemoptysis , cough, dyspnea, wheezing, chest pain, palpitations, orthopnea, edema, abdominal pain, nausea, melena, diarrhea, constipation, flank pain, dysuria, hematuria, urinary  Frequency, nocturia, numbness, tingling, seizures,  Focal weakness, Loss of consciousness,  Tremor, insomnia, depression, anxiety, and suicidal ideation.      Objective:  BP 138/72 mmHg  Pulse 64  Temp(Src) 97.2 F (36.2 C) (Oral)  Resp 12  Ht 5\' 3"  (1.6 m)  Wt 143 lb 8 oz (65.091 kg)  BMI 25.43 kg/m2  SpO2 97%  BP Readings from Last  3 Encounters:  11/28/15 138/72  10/14/15 144/70  09/16/15 116/64    Wt Readings from Last 3 Encounters:  11/28/15 143 lb 8 oz (65.091 kg)  10/14/15 141 lb 8 oz (64.184 kg)  09/16/15 143 lb 12 oz (65.205 kg)    General appearance: alert, cooperative and appears stated age Ears: normal TM's and external ear canals both ears Throat: lips, mucosa, and tongue normal; teeth and gums normal Neck: no adenopathy, no carotid bruit, supple,  symmetrical, trachea midline and thyroid not enlarged, symmetric, no tenderness/mass/nodules Back: symmetric, no curvature. ROM normal. No CVA tenderness. Lungs: clear to auscultation bilaterally Heart: regular rate and rhythm, S1, S2 normal, no murmur, click, rub or gallop Abdomen: soft, non-tender; bowel sounds normal; no masses,  no organomegaly Pulses: 2+ and symmetric Skin: Skin color, texture, turgor normal. No rashes or lesions Lymph nodes: Cervical, supraclavicular, and axillary nodes normal.  Lab Results  Component Value Date   HGBA1C 5.8 09/13/2015   HGBA1C 6.0 03/05/2015    Lab Results  Component Value Date   CREATININE 0.84 09/13/2015   CREATININE 0.75 03/05/2015   CREATININE 0.9 09/07/2014    Lab Results  Component Value Date   WBC 8.9 09/13/2015   HGB 13.2 09/13/2015   HCT 40.6 09/13/2015   PLT 257.0 09/13/2015   GLUCOSE 105* 09/13/2015   CHOL 216* 09/13/2015   TRIG 148.0 09/13/2015   HDL 56.00 09/13/2015   LDLCALC 130* 09/13/2015   ALT 14 09/13/2015   AST 20 09/13/2015   NA 141 09/13/2015   K 4.3 09/13/2015   CL 105 09/13/2015   CREATININE 0.84 09/13/2015   BUN 16 09/13/2015   CO2 29 09/13/2015   TSH 1.97 09/13/2015   HGBA1C 5.8 09/13/2015    Dg Abd 2 Views  09/16/2015  CLINICAL DATA:  Epigastric pain. Change in stool habits. Question constipation. EXAM: ABDOMEN - 2 VIEW COMPARISON:  None. FINDINGS: Moderate stool burden throughout the colon. There is normal bowel gas pattern. No free air. No organomegaly or suspicious calcification. No acute bony abnormality. IMPRESSION: Moderate stool burden.  No acute findings. Electronically Signed   By: Rolm Baptise M.D.   On: 09/16/2015 16:39    Assessment & Plan:   Problem List Items Addressed This Visit    PMR (polymyalgia rheumatica) ([place])    Quiescent,  With normal inflammatory markers. Nov 2016.  Now tapers off of prednisone.       Steroid-induced osteoporosis    Secondary to long history of  prednisone .  Treated previously with Boniva.  She has agreed to trail of Prolia      Relevant Medications   ergocalciferol (DRISDOL) 50000 units capsule   Osteoarthritis, shoulder    Now off of prednisone per evaluation with Rheumatology      Essential hypertension    Well controlled with metoprolol.  no changes today.  Lab Results  Component Value Date   CREATININE 0.84 09/13/2015   Lab Results  Component Value Date   NA 141 09/13/2015   K 4.3 09/13/2015   CL 105 09/13/2015   CO2 29 09/13/2015         Vitamin D deficiency - Primary    Recurrent low levels,  Continue Drisdol.       Relevant Orders   VITAMIN D 25 Hydroxy (Vit-D Deficiency, Fractures) (Completed)    A total of 25 minutes of face to face time was spent with patient more than half of which was spent in counselling about the above  mentioned conditions  and coordination of care   I have discontinued Ms. Carn's predniSONE. I am also having her start on ergocalciferol. Additionally, I am having her maintain her aspirin, levothyroxine, famotidine, Tdap, Lactulose, polyethylene glycol-electrolytes, Phenylephrine-DM-GG-APAP, and metoprolol tartrate.  Meds ordered this encounter  Medications  . ergocalciferol (DRISDOL) 50000 units capsule    Sig: Take 1 capsule (50,000 Units total) by mouth once a week.    Dispense:  12 capsule    Refill:  0    Medications Discontinued During This Encounter  Medication Reason  . predniSONE (DELTASONE) 1 MG tablet     Follow-up: No Follow-up on file.   Crecencio Mc, MD

## 2015-11-28 NOTE — Progress Notes (Signed)
Pre-visit discussion using our clinic review tool. No additional management support is needed unless otherwise documented below in the visit note.  

## 2015-11-28 NOTE — Patient Instructions (Addendum)
Your blood pressure today is perfect.  Continue metoprolol 50 mg twice daily   Continue checking once daily. Around lunchtime  If it starts to stay below [id number] systolic ,  reduce the metoprolol to 25 mg twice daily  You DO have osteoporosis  Based on your bone density test we did this summer  I recommend getting the majority of your calcium and Vitamin D  through diet rather than supplements given the recent association of calcium supplements with increased coronary artery calcium scores  Try the almond and cashew milks that most grocery stores  now carry  in the dairy  Section>   They are lactose free:  Sil[employer] brand Almond Light,  Original formula.  Delicious,  Low carb,  Low cal,  Cholesterol free   I have given you a list of foods that are rich in calcium  I am checking your vitamin d level    I am going to get your insurance company to authorize Prolia for your osteoporosis

## 2015-11-28 NOTE — Telephone Encounter (Signed)
Can you please start and approval for Prolia for this patient?

## 2015-11-30 DIAGNOSIS — I10 Essential (primary) hypertension: Secondary | ICD-10-CM | POA: Insufficient documentation

## 2015-11-30 DIAGNOSIS — M19019 Primary osteoarthritis, unspecified shoulder: Secondary | ICD-10-CM | POA: Insufficient documentation

## 2015-11-30 DIAGNOSIS — E559 Vitamin D deficiency, unspecified: Secondary | ICD-10-CM | POA: Insufficient documentation

## 2015-11-30 MED ORDER — ERGOCALCIFEROL 1.25 MG (50000 UT) PO CAPS: 50000.0000 [IU] | ORAL_CAPSULE | ORAL | Status: DC

## 2015-11-30 NOTE — Assessment & Plan Note (Signed)
Recurrent low levels,  Continue Drisdol.

## 2015-11-30 NOTE — Assessment & Plan Note (Addendum)
Secondary to long history of prednisone .  Treated previously with Boniva.  She has agreed to trail of Prolia

## 2015-11-30 NOTE — Assessment & Plan Note (Signed)
Well controlled with metoprolol.  no changes today.  Lab Results  Component Value Date   CREATININE 0.84 09/13/2015   Lab Results  Component Value Date   NA 141 09/13/2015   K 4.3 09/13/2015   CL 105 09/13/2015   CO2 29 09/13/2015

## 2015-11-30 NOTE — Assessment & Plan Note (Signed)
Quiescent,  With normal inflammatory markers. Nov 2016.  Now tapers off of prednisone.

## 2015-11-30 NOTE — Assessment & Plan Note (Signed)
Now off of prednisone per evaluation with Rheumatology

## 2015-12-02 NOTE — Telephone Encounter (Signed)
FYI Prolia authorization in process.

## 2015-12-02 NOTE — Telephone Encounter (Signed)
I have electronically submitted pt's info for Prolia insurance verification and will notify you once I have a response. Thank you. °

## 2015-12-05 DIAGNOSIS — M150 Primary generalized (osteo)arthritis: Secondary | ICD-10-CM | POA: Diagnosis not present

## 2015-12-05 DIAGNOSIS — M353 Polymyalgia rheumatica: Secondary | ICD-10-CM | POA: Diagnosis not present

## 2015-12-10 DIAGNOSIS — H6503 Acute serous otitis media, bilateral: Secondary | ICD-10-CM | POA: Diagnosis not present

## 2015-12-10 DIAGNOSIS — H903 Sensorineural hearing loss, bilateral: Secondary | ICD-10-CM | POA: Diagnosis not present

## 2015-12-11 NOTE — Telephone Encounter (Signed)
Left a VM to return my call. 

## 2015-12-11 NOTE — Telephone Encounter (Signed)
I have rec'd [name]' insurance verification for Prolia and she has an estimated responsibility of 0%.  Please make pt aware this is an estimate and we will not know an exact amt until insurance(s) has/have paid.  I have sent a copy of the summary of benefits to be scanned into pt's chart.     Once pt recs injection, please let me know actual injection date so I can update the Prolia portal.  If you have any questions, please let me know.

## 2015-12-13 NOTE — Telephone Encounter (Signed)
Please advise, as surgery was cancelled.

## 2015-12-13 NOTE — Telephone Encounter (Signed)
Disregard note below, put in wrong chart. Thanks

## 2015-12-16 DIAGNOSIS — M353 Polymyalgia rheumatica: Secondary | ICD-10-CM | POA: Diagnosis not present

## 2015-12-25 ENCOUNTER — Other Ambulatory Visit: Payer: Self-pay | Admitting: Internal Medicine

## 2016-01-28 DIAGNOSIS — J3089 Other allergic rhinitis: Secondary | ICD-10-CM | POA: Diagnosis not present

## 2016-01-31 ENCOUNTER — Encounter: Payer: Self-pay | Admitting: Internal Medicine

## 2016-02-03 NOTE — Telephone Encounter (Signed)
Patient taking Metropolol 12.5

## 2016-02-04 MED ORDER — METOPROLOL TARTRATE 25 MG PO TABS: 50.0000 mg | ORAL_TABLET | Freq: Two times a day (BID) | ORAL | Status: DC

## 2016-02-04 NOTE — Telephone Encounter (Signed)
Pt called about not having anymore blood pressure medication metoprolol tartrate (LOPRESSOR) 25 MG tablet. Pt has been without medication since yesterday. Pt BP was 194/102 last night. Pt went to [facility] 188/96. This morning 155/73. Pharmacy is [facility], [place]. Call pt @ [phone]. Thank you!

## 2016-02-04 NOTE — Telephone Encounter (Signed)
Medication called in for metoprolol 50 mg BID per note from last OV dated 11/28/2015. Patient also scheduled follow up appointment for 03/02/2016.

## 2016-03-02 ENCOUNTER — Ambulatory Visit: Payer: Medicare Other | Admitting: Internal Medicine

## 2016-03-02 DIAGNOSIS — Z0289 Encounter for other administrative examinations: Secondary | ICD-10-CM

## 2016-03-09 ENCOUNTER — Ambulatory Visit (INDEPENDENT_AMBULATORY_CARE_PROVIDER_SITE_OTHER): Payer: Medicare Other | Admitting: Internal Medicine

## 2016-03-09 ENCOUNTER — Telehealth: Payer: Self-pay | Admitting: Internal Medicine

## 2016-03-09 ENCOUNTER — Encounter: Payer: Self-pay | Admitting: Internal Medicine

## 2016-03-09 VITALS — BP 142/82 | HR 68 | Temp 97.7°F | Resp 18 | Wt 143.5 lb

## 2016-03-09 DIAGNOSIS — M818 Other osteoporosis without current pathological fracture: Secondary | ICD-10-CM | POA: Diagnosis not present

## 2016-03-09 DIAGNOSIS — I10 Essential (primary) hypertension: Secondary | ICD-10-CM

## 2016-03-09 DIAGNOSIS — T380X5A Adverse effect of glucocorticoids and synthetic analogues, initial encounter: Secondary | ICD-10-CM

## 2016-03-09 DIAGNOSIS — R194 Change in bowel habit: Secondary | ICD-10-CM | POA: Diagnosis not present

## 2016-03-09 DIAGNOSIS — E559 Vitamin D deficiency, unspecified: Secondary | ICD-10-CM

## 2016-03-09 DIAGNOSIS — Z803 Family history of malignant neoplasm of breast: Secondary | ICD-10-CM

## 2016-03-09 LAB — COMPREHENSIVE METABOLIC PANEL
ALBUMIN: 4 g/dL (ref 3.5–5.2)
ALK PHOS: 67 U/L (ref 39–117)
ALT: 13 U/L (ref 0–35)
AST: 15 U/L (ref 0–37)
BILIRUBIN TOTAL: 0.4 mg/dL (ref 0.2–1.2)
BUN: 19 mg/dL (ref 6–23)
CO2: 30 mEq/L (ref 19–32)
Calcium: 9.3 mg/dL (ref 8.4–10.5)
Chloride: 104 mEq/L (ref 96–112)
Creatinine, Ser: 0.81 mg/dL (ref 0.40–1.20)
GFR: 71.1 mL/min (ref >60.00)
GLUCOSE: 91 mg/dL (ref 70–99)
Potassium: 4.5 mEq/L (ref 3.5–5.1)
SODIUM: 139 meq/L (ref 135–145)
TOTAL PROTEIN: 6.4 g/dL (ref 6.0–8.3)

## 2016-03-09 LAB — VITAMIN D 25 HYDROXY (VIT D DEFICIENCY, FRACTURES): VITD: 38.05 ng/mL (ref 30.00–100.00)

## 2016-03-09 MED ORDER — AMLODIPINE BESYLATE 2.5 MG PO TABS: 2.5000 mg | ORAL_TABLET | Freq: Every day | ORAL | Status: DC

## 2016-03-09 NOTE — Progress Notes (Signed)
Pre visit review using our clinic review tool, if applicable. No additional management support is needed unless otherwise documented below in the visit note. 

## 2016-03-09 NOTE — Telephone Encounter (Signed)
Ordered Prolia injection in [facility] today.  I will notify the patient when it arrives to schedule an appt.

## 2016-03-09 NOTE — Progress Notes (Signed)
Subjective:  Patient ID: [name], female    DOB: 06/11/1927  Age: 82 y.o. MRN: [id number]  CC: The primary encounter diagnosis was Essential hypertension. Diagnoses of Vitamin D deficiency, Change in bowel habits, Steroid-induced osteoporosis, and Family history of breast cancer in first degree relative were also pertinent to this visit.  HPI [name] presents for  follow up on hypertension and chronic constipation.  Her home readings have been checked daily  And she  notes elevation from 150 to 170 on twice daily metoprolol 50 mg bid and no side effects from medication.  She reports having ample  energy but no motivation  To do anything.   Deferred  Cologuard testing.   Has not been called about her first Prolia injection which was approved in February.   Mammogram normal 2014 . Mother had BRCA at age 78 and underwent resection only .  Sister had BRCA and underwent  Mastectomy.  Reviewed her general health along with the principles of screening vs diagnosis.  After lengthy discussion, it appears that she would definitely consider treatment for BRCA and is therefore requesting to be brought u to date on screening with annual  mammogram .  Has been more cautious about falling.  Feels more forgetful but eventually remembers what she forgot .  Has been "absent minder and scattered" since adolescence.  Not getting lost or forgetting appointments,    Constipation  has resolved.      Outpatient Prescriptions Prior to Visit  Medication Sig Dispense Refill  . aspirin 81 MG tablet Take 81 mg by mouth daily.    . ergocalciferol (DRISDOL) 50000 units capsule Take 1 capsule (50,000 Units total) by mouth once a week. 12 capsule 0  . levothyroxine (SYNTHROID, LEVOTHROID) 100 MCG tablet TAKE 1 TABLET BY MOUTH ONCE DAILY ON AN EMPTY STOMACH. WAIT 30 MINUTES BEFORE TAKING OTHER MEDS. 30 tablet 4  . metoprolol tartrate (LOPRESSOR) 25 MG tablet Take 2 tablets (50 mg total) by mouth 2 (two) times  daily. 120 tablet 4  . Phenylephrine-DM-GG-APAP (TYLENOL COLD/FLU SEVERE) 5-10-200-325 MG TABS Take 1 tablet by mouth 2 (two) times daily. Reported on 11/28/2015    . Tdap (BOOSTRIX) 5-2.5-18.5 LF-MCG/0.5 injection Inject 0.5 mLs into the muscle once. (Patient not taking: Reported on 10/14/2015) 0.5 mL 0  . famotidine (PEPCID) 20 MG tablet Take 1 tablet (20 mg total) by mouth 2 (two) times daily. (Patient not taking: Reported on 11/28/2015) 60 tablet 3  . Lactulose 20 GM/30ML SOLN 30 ml every 4 hours until constipation is relieved (Patient not taking: Reported on 10/14/2015) 236 mL 3  . polyethylene glycol-electrolytes (GAVILYTE-N WITH FLAVOR PACK) 420 G solution Drink an 8 ounce glass every 30 minutes until your stool becomes clear (Patient not taking: Reported on 10/14/2015) 4000 mL 0   No facility-administered medications prior to visit.    Review of Systems;  Patient denies headache, fevers, malaise, unintentional weight loss, skin rash, eye pain, sinus congestion and sinus pain, sore throat, dysphagia,  hemoptysis , cough, dyspnea, wheezing, chest pain, palpitations, orthopnea, edema, abdominal pain, nausea, melena, diarrhea, constipation, flank pain, dysuria, hematuria, urinary  Frequency, nocturia, numbness, tingling, seizures,  Focal weakness, Loss of consciousness,  Tremor, insomnia, depression, anxiety, and suicidal ideation.      Objective:  BP 142/82 mmHg  Pulse 68  Temp(Src) 97.7 F (36.5 C) (Oral)  Resp 18  Wt 143 lb 8 oz (65.091 kg)  SpO2 94%  BP Readings from Last 3 Encounters:  03/09/16 142/82  11/28/15 138/72  10/14/15 144/70    Wt Readings from Last 3 Encounters:  03/09/16 143 lb 8 oz (65.091 kg)  11/28/15 143 lb 8 oz (65.091 kg)  10/14/15 141 lb 8 oz (64.184 kg)    General appearance: alert, cooperative and appears stated age Ears: normal TM's and external ear canals both ears Throat: lips, mucosa, and tongue normal; teeth and gums normal Neck: no adenopathy,  no carotid bruit, supple, symmetrical, trachea midline and thyroid not enlarged, symmetric, no tenderness/mass/nodules Back: symmetric, no curvature. ROM normal. No CVA tenderness. Lungs: clear to auscultation bilaterally Heart: regular rate and rhythm, S1, S2 normal, no murmur, click, rub or gallop Abdomen: soft, non-tender; bowel sounds normal; no masses,  no organomegaly Pulses: 2+ and symmetric Skin: Skin color, texture, turgor normal. No rashes or lesions Lymph nodes: Cervical, supraclavicular, and axillary nodes normal.  Lab Results  Component Value Date   HGBA1C 5.8 09/13/2015   HGBA1C 6.0 03/05/2015    Lab Results  Component Value Date   CREATININE 0.81 03/09/2016   CREATININE 0.84 09/13/2015   CREATININE 0.75 03/05/2015    Lab Results  Component Value Date   WBC 8.9 09/13/2015   HGB 13.2 09/13/2015   HCT 40.6 09/13/2015   PLT 257.0 09/13/2015   GLUCOSE 91 03/09/2016   CHOL 216* 09/13/2015   TRIG 148.0 09/13/2015   HDL 56.00 09/13/2015   LDLCALC 130* 09/13/2015   ALT 13 03/09/2016   AST 15 03/09/2016   NA 139 03/09/2016   K 4.5 03/09/2016   CL 104 03/09/2016   CREATININE 0.81 03/09/2016   BUN 19 03/09/2016   CO2 30 03/09/2016   TSH 1.97 09/13/2015   HGBA1C 5.8 09/13/2015    Dg Abd 2 Views  09/16/2015  CLINICAL DATA:  Epigastric pain. Change in stool habits. Question constipation. EXAM: ABDOMEN - 2 VIEW COMPARISON:  None. FINDINGS: Moderate stool burden throughout the colon. There is normal bowel gas pattern. No free air. No organomegaly or suspicious calcification. No acute bony abnormality. IMPRESSION: Moderate stool burden.  No acute findings. Electronically Signed   By: Rolm Baptise M.D.   On: 09/16/2015 16:39    Assessment & Plan:   Problem List Items Addressed This Visit    Steroid-induced osteoporosis    She has been approved for Prolia and has a history of pill esophagitis with prior trial of alendronate.  Discussed the current controversies  surrounding the risks and benefits of calcium supplementation.  Encouraged her to increase dietary calcium through natural foods including almond/coconut milk      Change in bowel habits    Her constipation has resolved.  She has deferred Cologuard testing.       Essential hypertension - Primary    Not Well controlled with metoprolol alone.  Adding amlodipine 2.5 mg daily Lab Results  Component Value Date   CREATININE 0.81 03/09/2016   Lab Results  Component Value Date   NA 139 03/09/2016   K 4.5 03/09/2016   CL 104 03/09/2016   CO2 30 03/09/2016           Relevant Medications   amLODipine (NORVASC) 2.5 MG tablet   Other Relevant Orders   Comprehensive metabolic panel (Completed)   Family history of breast cancer in first degree relative    Screening mammogram ordered.       Relevant Orders   MM DIGITAL SCREENING BILATERAL   Vitamin D deficiency   Relevant Orders   VITAMIN D 25 Hydroxy (  Vit-D Deficiency, Fractures) (Completed)     A total of 25 minutes of face to face time was spent with patient more than half of which was spent in counselling about the above mentioned conditions  and coordination of care  I have discontinued Ms. Brockman's famotidine, Lactulose, and polyethylene glycol-electrolytes. I am also having her start on amLODipine. Additionally, I am having her maintain her aspirin, Tdap, Phenylephrine-DM-GG-APAP, ergocalciferol, levothyroxine, and metoprolol tartrate.  Meds ordered this encounter  Medications  . amLODipine (NORVASC) 2.5 MG tablet    Sig: Take 1 tablet (2.5 mg total) by mouth daily.    Dispense:  90 tablet    Refill:  3    Medications Discontinued During This Encounter  Medication Reason  . famotidine (PEPCID) 20 MG tablet   . Lactulose 20 GM/30ML SOLN   . polyethylene glycol-electrolytes (GAVILYTE-N WITH FLAVOR PACK) 420 G solution     Follow-up: Return in about 6 months (around 09/09/2016) for wellness visit .   Crecencio Mc,  MD

## 2016-03-09 NOTE — Telephone Encounter (Signed)
Looks like I attempted to call her to review verification and never got a call back, so until I hear back from them I don't order due to cost I can order today and schedule at the end of this week to have her received it, did someone review the message from [facility] with her? thanks

## 2016-03-09 NOTE — Patient Instructions (Addendum)
Your blood pressure better but is still elevated above goal (goal is 130/80 or less)    Please Continue metoprolol twice  Daily,    But I am adding  amlodipine 2.5 mg once daily    I have ordered your mammogram   We will get the prolia for you  To treat your osteoporosis .  I'll see you in 6 months for your wellness visit

## 2016-03-09 NOTE — Telephone Encounter (Signed)
Please order.  Discussed with patient today

## 2016-03-09 NOTE — Telephone Encounter (Signed)
Patient's prolia was approved 3 months ago. See note from rosemary. . Do she you know why hasn't received it?  She is here today for follow up an dthere is none in the referigerator

## 2016-03-10 ENCOUNTER — Encounter: Payer: Self-pay | Admitting: Internal Medicine

## 2016-03-10 DIAGNOSIS — Z803 Family history of malignant neoplasm of breast: Secondary | ICD-10-CM | POA: Insufficient documentation

## 2016-03-10 NOTE — Assessment & Plan Note (Signed)
Not Well controlled with metoprolol alone.  Adding amlodipine 2.5 mg daily Lab Results  Component Value Date   CREATININE 0.81 03/09/2016   Lab Results  Component Value Date   NA 139 03/09/2016   K 4.5 03/09/2016   CL 104 03/09/2016   CO2 30 03/09/2016

## 2016-03-10 NOTE — Assessment & Plan Note (Signed)
She has been approved for Prolia and has a history of pill esophagitis with prior trial of alendronate.  Discussed the current controversies surrounding the risks and benefits of calcium supplementation.  Encouraged her to increase dietary calcium through natural foods including almond/coconut milk

## 2016-03-10 NOTE — Assessment & Plan Note (Signed)
Screening mammogram ordered

## 2016-03-10 NOTE — Assessment & Plan Note (Signed)
Her constipation has resolved.  She has deferred Cologuard testing.

## 2016-03-13 NOTE — Telephone Encounter (Signed)
Scheduled for Prolia injection on 5/23.

## 2016-03-14 NOTE — Telephone Encounter (Signed)
  I have reviewed the above information and agree with above.   Teresa Tullo, MD 

## 2016-03-17 ENCOUNTER — Ambulatory Visit (INDEPENDENT_AMBULATORY_CARE_PROVIDER_SITE_OTHER): Payer: Medicare Other

## 2016-03-17 ENCOUNTER — Telehealth: Payer: Self-pay | Admitting: *Deleted

## 2016-03-17 DIAGNOSIS — M810 Age-related osteoporosis without current pathological fracture: Secondary | ICD-10-CM | POA: Diagnosis not present

## 2016-03-17 MED ORDER — DENOSUMAB 60 MG/ML ~~LOC~~ SOLN
60.0000 mg | Freq: Once | SUBCUTANEOUS | Status: AC
  Administered 2016-03-17: 60 mg via SUBCUTANEOUS

## 2016-03-17 NOTE — Progress Notes (Signed)
Patient presented for 1st Prolia injecton, per Dr. Lupita Dawn order.  Administered L arm, subcutaneous.  Tolerated well.  Verbalized understanding of the need to call the office in October 2017 to order next prolia medication and return in November 2017 for administration.  Educational material provided.

## 2016-03-17 NOTE — Telephone Encounter (Signed)
-----   Message from Dia Crawford, LPN sent at [id number]  4:58 PM EDT ----- Regarding: Medication management C/O new medication Amlodipine disrupting her sleep pattern.  She is only sleeping 3-4 hours a night and believes this may be a contributing factor.  She would like to change to another medication is possible.  Please call with response because he mychart is currently down.    Thanks, D

## 2016-03-18 MED ORDER — LOSARTAN POTASSIUM 50 MG PO TABS: 50.0000 mg | ORAL_TABLET | Freq: Every day | ORAL | Status: DC

## 2016-03-18 NOTE — Telephone Encounter (Signed)
LOSARTAN  50 MG DAILY TO BE SENT TO PHARMACY AS ALTERNATIVE, ONE TABLET DAILY IN THE AM

## 2016-03-18 NOTE — Telephone Encounter (Signed)
Patient notified an voiced understanding .

## 2016-04-14 ENCOUNTER — Observation Stay
Admission: EM | Admit: 2016-04-14 | Discharge: 2016-04-15 | Disposition: A | Discharge status: Home / Self Care | Payer: Medicare Other | Attending: Internal Medicine | Admitting: Internal Medicine

## 2016-04-14 ENCOUNTER — Emergency Department: Payer: Medicare Other

## 2016-04-14 ENCOUNTER — Encounter: Payer: Self-pay | Admitting: Emergency Medicine

## 2016-04-14 DIAGNOSIS — M1990 Unspecified osteoarthritis, unspecified site: Secondary | ICD-10-CM | POA: Diagnosis not present

## 2016-04-14 DIAGNOSIS — Z803 Family history of malignant neoplasm of breast: Secondary | ICD-10-CM | POA: Insufficient documentation

## 2016-04-14 DIAGNOSIS — M810 Age-related osteoporosis without current pathological fracture: Secondary | ICD-10-CM | POA: Insufficient documentation

## 2016-04-14 DIAGNOSIS — H9190 Unspecified hearing loss, unspecified ear: Secondary | ICD-10-CM | POA: Diagnosis not present

## 2016-04-14 DIAGNOSIS — R4781 Slurred speech: Secondary | ICD-10-CM | POA: Diagnosis not present

## 2016-04-14 DIAGNOSIS — H538 Other visual disturbances: Secondary | ICD-10-CM

## 2016-04-14 DIAGNOSIS — G459 Transient cerebral ischemic attack, unspecified: Secondary | ICD-10-CM | POA: Diagnosis not present

## 2016-04-14 DIAGNOSIS — Z800 Family history of malignant neoplasm of digestive organs: Secondary | ICD-10-CM | POA: Insufficient documentation

## 2016-04-14 DIAGNOSIS — I083 Combined rheumatic disorders of mitral, aortic and tricuspid valves: Secondary | ICD-10-CM | POA: Insufficient documentation

## 2016-04-14 DIAGNOSIS — Z808 Family history of malignant neoplasm of other organs or systems: Secondary | ICD-10-CM | POA: Diagnosis not present

## 2016-04-14 DIAGNOSIS — N390 Urinary tract infection, site not specified: Secondary | ICD-10-CM | POA: Diagnosis present

## 2016-04-14 DIAGNOSIS — I10 Essential (primary) hypertension: Secondary | ICD-10-CM | POA: Insufficient documentation

## 2016-04-14 DIAGNOSIS — Z79899 Other long term (current) drug therapy: Secondary | ICD-10-CM | POA: Diagnosis not present

## 2016-04-14 DIAGNOSIS — R41840 Attention and concentration deficit: Secondary | ICD-10-CM | POA: Diagnosis not present

## 2016-04-14 DIAGNOSIS — E039 Hypothyroidism, unspecified: Secondary | ICD-10-CM | POA: Insufficient documentation

## 2016-04-14 DIAGNOSIS — R2681 Unsteadiness on feet: Secondary | ICD-10-CM | POA: Diagnosis not present

## 2016-04-14 LAB — DIFFERENTIAL
BASOS ABS: 0.1 10*3/uL (ref 0–0.1)
BASOS PCT: 1 %
Eosinophils Absolute: 0.3 10*3/uL (ref 0–0.7)
Eosinophils Relative: 2 %
LYMPHS PCT: 28 %
Lymphs Abs: 3.1 10*3/uL (ref 1.0–3.6)
Monocytes Absolute: 1 10*3/uL — ABNORMAL HIGH (ref 0.2–0.9)
Monocytes Relative: 9 %
NEUTROS ABS: 6.6 10*3/uL — AB (ref 1.4–6.5)
NEUTROS PCT: 60 %

## 2016-04-14 LAB — URINALYSIS COMPLETE WITH MICROSCOPIC (ARMC ONLY)
BILIRUBIN URINE: NEGATIVE
Bacteria, UA: NONE SEEN
Glucose, UA: NEGATIVE mg/dL
KETONES UR: NEGATIVE mg/dL
Nitrite: NEGATIVE
PH: 6 (ref 5.0–8.0)
PROTEIN: NEGATIVE mg/dL
Specific Gravity, Urine: 1.009 (ref 1.005–1.030)

## 2016-04-14 LAB — COMPREHENSIVE METABOLIC PANEL
ALBUMIN: 4.4 g/dL (ref 3.5–5.0)
ALK PHOS: 73 U/L (ref 38–126)
ALT: 19 U/L (ref 14–54)
ANION GAP: 9 (ref 5–15)
AST: 22 U/L (ref 15–41)
BUN: 21 mg/dL — ABNORMAL HIGH (ref 6–20)
CALCIUM: 9 mg/dL (ref 8.9–10.3)
CHLORIDE: 101 mmol/L (ref 101–111)
CO2: 26 mmol/L (ref 22–32)
CREATININE: 0.87 mg/dL (ref 0.44–1.00)
GFR calc non Af Amer: 58 mL/min — ABNORMAL LOW (ref >60)
GLUCOSE: 100 mg/dL — AB (ref 65–99)
Potassium: 4.8 mmol/L (ref 3.5–5.1)
SODIUM: 136 mmol/L (ref 135–145)
Total Bilirubin: 0.8 mg/dL (ref 0.3–1.2)
Total Protein: 7.3 g/dL (ref 6.5–8.1)

## 2016-04-14 LAB — CBC
HCT: 41.5 % (ref 35.0–47.0)
Hemoglobin: 13.8 g/dL (ref 12.0–16.0)
MCH: 30.1 pg (ref 26.0–34.0)
MCHC: 33.2 g/dL (ref 32.0–36.0)
MCV: 90.7 fL (ref 80.0–100.0)
PLATELETS: 257 10*3/uL (ref 150–440)
RBC: 4.57 MIL/uL (ref 3.80–5.20)
RDW: 13.6 % (ref 11.5–14.5)
WBC: 11.1 10*3/uL — AB (ref 3.6–11.0)

## 2016-04-14 LAB — PROTIME-INR
INR: 0.96
PROTHROMBIN TIME: 13 s (ref 11.4–15.0)

## 2016-04-14 LAB — TROPONIN I: Troponin I: <0.03 ng/mL (ref <0.031)

## 2016-04-14 LAB — GLUCOSE, CAPILLARY: GLUCOSE-CAPILLARY: 83 mg/dL (ref 65–99)

## 2016-04-14 LAB — APTT: APTT: 29 s (ref 24–36)

## 2016-04-14 MED ORDER — GADOBENATE DIMEGLUMINE 529 MG/ML IV SOLN
15.0000 mL | Freq: Once | INTRAVENOUS | Status: AC | PRN
  Administered 2016-04-14: 13 mL via INTRAVENOUS

## 2016-04-14 MED ORDER — HYDRALAZINE HCL 20 MG/ML IJ SOLN
10.0000 mg | Freq: Once | INTRAMUSCULAR | Status: AC
  Administered 2016-04-14: 10 mg via INTRAVENOUS
  Filled 2016-04-14: qty 1

## 2016-04-14 MED ORDER — DEXTROSE 5 % IV SOLN
1.0000 g | Freq: Once | INTRAVENOUS | Status: DC
  Filled 2016-04-14: qty 10

## 2016-04-14 MED ORDER — CEPHALEXIN 500 MG PO CAPS: 500.0000 mg | ORAL_CAPSULE | Freq: Four times a day (QID) | ORAL | Status: DC

## 2016-04-14 NOTE — ED Provider Notes (Signed)
[facility] Emergency Department Provider Note  ____________________________________________  Time seen: Approximately 6:34 PM  I have reviewed the triage vital signs and the nursing notes.   HISTORY  Chief Complaint Hypertension; Eye Problem; and Code Stroke    HPI [name] is a 82 y.o. female with a history of HTNand presbyopia presenting for blurred vision. The patient reports that she was reading at 3:30 PM when all the words on the page became blurred. This progressively improved although it did not resolve, and then worsened again. The patient reports that for the last several years, every couple of months she has a similar episode, but usually it resolves and does not come back. She had no associated headache, numbness tingling or weakness, difficulty walking, difficulty with speech, or confusion. She has not recently had any changes in her medications and did take all of her blood pressure medications today she does not have any recent illness or trauma. At this time, the patient states that her symptoms have completely resolved and that her eyesight is completely back to normal.   Past Medical History  Diagnosis Date  . Hypertension   . Arthritis   . Carpal tunnel syndrome   . Osteoporosis   . Gait abnormality     has for 69yr-no use of cane  . Hypothyroidism   . Wears glasses   . Wears hearing aid     Patient Active Problem List   Diagnosis Date Noted  . Family history of breast cancer in first degree relative 03/10/2016  . Osteoarthritis, shoulder 11/30/2015  . Essential hypertension 11/30/2015  . Vitamin D deficiency 11/30/2015  . Change in bowel habits 09/16/2015  . 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 Age-related cognitive decline 09/09/2014  . PMR (polymyalgia rheumatica) (HCC) 09/09/2014  . Long-term use of high-risk medication 09/09/2014    Past Surgical History  Procedure Laterality Date  . Tonsillectomy    .  Appendectomy    . Subdural hematoma evacuation via craniotomy  2007  . Colonoscopy    . Carpal tunnel release Left 07/11/2013    Procedure: CARPAL TUNNEL RELEASE;  Surgeon: Cammie Sickle., MD;  Location: [place];  Service: Orthopedics;  Laterality: Left;    Current Outpatient Rx  Name  Route  Sig  Dispense  Refill  . amLODipine (NORVASC) 2.5 MG tablet   Oral   Take 1 tablet (2.5 mg total) by mouth daily.   90 tablet   3   . aspirin EC 81 MG tablet   Oral   Take 81 mg by mouth every evening.         [name] levothyroxine (SYNTHROID, LEVOTHROID) 100 MCG tablet   Oral   Take 100 mcg by mouth daily before breakfast.         . losartan (COZAAR) 50 MG tablet   Oral   Take 1 tablet (50 mg total) by mouth daily.   30 tablet   3   . metoprolol tartrate (LOPRESSOR) 25 MG tablet   Oral   Take 2 tablets (50 mg total) by mouth 2 (two) times daily.   120 tablet   4   . predniSONE (DELTASONE) 1 MG tablet   Oral   Take 4 mg by mouth daily with breakfast.         . cephALEXin (KEFLEX) 500 MG capsule   Oral   Take 1 capsule (500 mg total) by mouth 4 (four) times daily.   20 capsule  0     Allergies Review of patient's allergies indicates no known allergies.  Family History  Problem Relation Age of Onset  . Cancer Mother 28    breast ca  . Cancer Maternal Aunt   . Cancer Brother     pancreatic ca  . Breast cancer Sister   . Brain cancer Brother     astrocytoma?    Social History Social History  Substance Use Topics  . Smoking status: Never Smoker   . Smokeless tobacco: None  . Alcohol Use: Yes     Comment: occ    Review of Systems Constitutional: No fever/chills.No lightheadedness or syncope.  Eyes: Positive blurred vision. Negative double vision. ENT: No sore throat. No congestion or rhinorrhea. Cardiovascular: Denies chest pain. Denies palpitations. Respiratory: Denies shortness of breath.  No cough. Gastrointestinal: No abdominal  pain.  No nausea, no vomiting.  No diarrhea.  No constipation. Genitourinary: Negative for dysuria. Musculoskeletal: Negative for back pain. Skin: Negative for rash. Neurological: Negative for headaches. No focal numbness, tingling or weakness.   10-point ROS otherwise negative.  ____________________________________________   PHYSICAL EXAM:  VITAL SIGNS: ED Triage Vitals  Enc Vitals Group     BP 04/14/16 1800 194/94 mmHg     Pulse Rate 04/14/16 1800 68     Resp 04/14/16 1800 18     Temp 04/14/16 1800 97.9 F (36.6 C)     Temp Source 04/14/16 1800 Oral     SpO2 04/14/16 1800 99 %     Weight 04/14/16 1800 140 lb (63.504 kg)     Height 04/14/16 1800 5\' 5"  (1.651 m)     Head Cir --      Peak Flow --      Pain Score 04/14/16 1805 0     Pain Loc --      Pain Edu? --      Excl. in [place]? --     Constitutional: Alert and oriented. Well appearing and in no acute distress. Answers questions appropriately. Eyes: Conjunctivae are normal.  EOMI. PERRLA. No nystagmus. No scleral icterus. Head: Atraumatic. Nose: No congestion/rhinnorhea. Mouth/Throat: Mucous membranes are moist.  Neck: No stridor.  Supple.  No JVD. No meningismus. Cardiovascular: Normal rate, regular rhythm. No murmurs, rubs or gallops.  Respiratory: Normal respiratory effort.  No accessory muscle use or retractions. Lungs CTAB.  No wheezes, rales or ronchi. Gastrointestinal: Soft, nontender and nondistended.  No guarding or rebound.  No peritoneal signs. Musculoskeletal: No LE edema. No ttp in the calves or palpable cords.  Negative Homan's sign. Neurologic: Alert and oriented 3. Speech is clear. Naming and repetition are intact. Face and smile symmetric. Tongue is midline. EOMI and PERRLA. No nystagmus. No visual field deficit.  No pronator drift. 5 out of 5 grip, biceps, triceps, hip flexors, plantar flexion and dorsiflexion. Normal sensation to light touch in the bilateral upper and lower extremities, and face. Normal  heel-to-shin. Skin:  Skin is warm, dry and intact. No rash noted. Psychiatric: Mood and affect are normal. Speech and behavior are normal.  Normal judgement.  ____________________________________________   LABS (all labs ordered are listed, but only abnormal results are displayed)  Labs Reviewed  CBC - Abnormal; Notable for the following:    WBC 11.1 (*)    All other components within normal limits  DIFFERENTIAL - Abnormal; Notable for the following:    Neutro Abs 6.6 (*)    Monocytes Absolute 1.0 (*)    All other components within normal limits  COMPREHENSIVE METABOLIC PANEL - Abnormal; Notable for the following:    Glucose, Bld 100 (*)    BUN 21 (*)    GFR calc non Af Amer 58 (*)    All other components within normal limits  URINALYSIS COMPLETEWITH MICROSCOPIC ([facility] ONLY) - Abnormal; Notable for the following:    Color, Urine YELLOW (*)    APPearance CLOUDY (*)    Hgb urine dipstick 1+ (*)    Leukocytes, UA 3+ (*)    Squamous Epithelial / LPF 6-30 (*)    All other components within normal limits  URINE CULTURE  PROTIME-INR  APTT  TROPONIN I  GLUCOSE, CAPILLARY  CBG MONITORING, ED   ____________________________________________  EKG  ED ECG REPORT I, Eula Listen, the attending physician, personally viewed and interpreted this ECG.   Date: 04/14/2016  EKG Time: 1820  Rate: 71  Rhythm: normal sinus rhythm, LBBB  Axis: [place]  Intervals:none  ST&T Change: No STEMI, does not meet Sgarbosa's criteria.  EKG is compared to 2014 where she does have a previous left bundle branch block and similar morphology.  ____________________________________________  RADIOLOGY  Ct Head Wo Contrast  04/14/2016  CLINICAL DATA:  Acute onset of difficulty focusing and slurred speech. Initial encounter. EXAM: CT HEAD WITHOUT CONTRAST TECHNIQUE: Contiguous axial images were obtained from the base of the skull through the vertex without intravenous contrast. COMPARISON:  CT of  the head performed 03/14/2010, and MRI of the brain performed 09/10/2011 FINDINGS: There is no evidence of acute infarction, mass lesion, or intra- or extra-axial hemorrhage on CT. Minimal periventricular and subcortical white matter change likely reflects small vessel ischemic microangiopathy. Prominence of the ventricles and sulci reflects mild cortical volume loss. Mild cerebellar atrophy is noted. The brainstem and fourth ventricle are within normal limits. The basal ganglia are unremarkable in appearance. The cerebral hemispheres demonstrate grossly normal gray-white differentiation. No mass effect or midline shift is seen. There is no evidence of fracture; a bore hole is noted at the high left posterior parietal calvarium. The orbits are within normal limits. The paranasal sinuses and mastoid air cells are well-aerated. No significant soft tissue abnormalities are seen. IMPRESSION: 1. No acute intracranial pathology seen on CT. 2. Minimal small vessel ischemic microangiopathy and mild cortical volume loss. These results were called by telephone at the time of interpretation on 04/14/2016 at 6:27 pm to Dr. Larae Grooms, who verbally acknowledged these results. Electronically Signed   By: Garald Balding M.D.   On: 04/14/2016 18:27    ____________________________________________   PROCEDURES  Procedure(s) performed: None  Critical Care performed: No ____________________________________________   INITIAL IMPRESSION / ASSESSMENT AND PLAN / ED COURSE  Pertinent labs & imaging results that were available during my care of the patient were reviewed by me and considered in my medical decision making (see chart for details).  82 y.o. female who presents with hypertension and blurred vision, who is asymptomatic at this time. The patient's CT scan is negative for any acute intracranial process. It is possible that the patient has symptomatic hypertension. We will also evaluate her for stroke and a  code stroke has been called. Other etiologies include primary eye pathology although bilateral pathology would be less likely including embolic disease.  No TPA is indicated at this time and I did have an extensive discussion with the family about this.  ----------------------------------------- 7:22 PM on 04/14/2016 -----------------------------------------  I spoke with the neurologist on-call today, who has also seen and evaluated the patient through the  TTS service. We agree that the patient's examination is reassuring and that her neurologic symptoms have completely resolved. We will get an MRI and MRA of the brain and neck for complete evaluation, but if this is negative, I will anticipate discharge home.  ----------------------------------------- 7:28 PM on 04/14/2016 -----------------------------------------  The patient's repeat blood pressure is 141/51 and she continues to be asymptomatic. Have discussed the plan with the patient and her family.  ----------------------------------------- 11:28 PM on 04/14/2016 -----------------------------------------  The patient is still awaiting her MRI. She has not had any further neurologic abnormalities, nor blurred vision since arrival. She doesn't have white blood cells and leukocyte esterase in her urine, and a urine culture has been sent. I have written her for Rocephin IV, and a prescription for Keflex. The patient and the family would like to discuss whether to take antibiotics or weight the urine culture results. I have given my recommendation to take the antibiotics as a symptomatically bacteria is common in elderly patients, but they're unsure whether they want to take antibiotics at this time. They will let the nurse now. A STEMI patient out to Dr. Edd Fabian, who will follow up the results of her MRI for final disposition.   ____________________________________________  FINAL CLINICAL IMPRESSION(S) / ED DIAGNOSES  Final diagnoses:   Essential hypertension  Blurred vision, bilateral  UTI (lower urinary tract infection)      NEW MEDICATIONS STARTED DURING THIS VISIT:  New Prescriptions   CEPHALEXIN (KEFLEX) 500 MG CAPSULE    Take 1 capsule (500 mg total) by mouth 4 (four) times daily.     Eula Listen, MD 04/14/16 2329

## 2016-04-14 NOTE — ED Notes (Signed)
Patient transported to MRI 

## 2016-04-14 NOTE — ED Notes (Signed)
CODE  STROKE  CALLED  TO [id number]

## 2016-04-14 NOTE — ED Notes (Signed)
Patient presents to the ED with difficulty focusing x 2 hours with hypertension.  Patient's smile is even at this time and hand grips are even.  Patient denies dizziness at this time.  Patient's speech seems slurred to this RN but family states it is baseline.

## 2016-04-15 ENCOUNTER — Telehealth: Payer: Self-pay | Admitting: Internal Medicine

## 2016-04-15 ENCOUNTER — Observation Stay (HOSPITAL_BASED_OUTPATIENT_CLINIC_OR_DEPARTMENT_OTHER)
Admit: 2016-04-15 | Discharge: 2016-04-15 | Disposition: A | Discharge status: Home / Self Care | Payer: Medicare Other | Attending: Internal Medicine | Admitting: Internal Medicine

## 2016-04-15 DIAGNOSIS — R41840 Attention and concentration deficit: Secondary | ICD-10-CM | POA: Diagnosis not present

## 2016-04-15 DIAGNOSIS — I10 Essential (primary) hypertension: Secondary | ICD-10-CM | POA: Diagnosis not present

## 2016-04-15 DIAGNOSIS — G459 Transient cerebral ischemic attack, unspecified: Secondary | ICD-10-CM | POA: Diagnosis not present

## 2016-04-15 DIAGNOSIS — E039 Hypothyroidism, unspecified: Secondary | ICD-10-CM | POA: Diagnosis not present

## 2016-04-15 MED ORDER — CEFTRIAXONE SODIUM 1 G IJ SOLR
1.0000 g | INTRAMUSCULAR | Status: DC
  Administered 2016-04-15: 10:00:00 1 g via INTRAVENOUS
  Filled 2016-04-15: qty 10

## 2016-04-15 MED ORDER — ASPIRIN 81 MG PO CHEW
324.0000 mg | CHEWABLE_TABLET | Freq: Once | ORAL | Status: AC
  Administered 2016-04-15: 324 mg via ORAL
  Filled 2016-04-15: qty 4

## 2016-04-15 MED ORDER — ASPIRIN EC 81 MG PO TBEC: 81.0000 mg | DELAYED_RELEASE_TABLET | Freq: Every evening | ORAL | Status: DC

## 2016-04-15 MED ORDER — AMLODIPINE BESYLATE 5 MG PO TABS
2.5000 mg | ORAL_TABLET | Freq: Every day | ORAL | Status: DC
  Administered 2016-04-15: 2.5 mg via ORAL
  Filled 2016-04-15: qty 1

## 2016-04-15 MED ORDER — ACETAMINOPHEN 325 MG PO TABS: 650.0000 mg | ORAL_TABLET | Freq: Four times a day (QID) | ORAL | Status: DC | PRN

## 2016-04-15 MED ORDER — LEVOTHYROXINE SODIUM 100 MCG PO TABS: 100.0000 ug | ORAL_TABLET | Freq: Every day | ORAL | Status: DC

## 2016-04-15 MED ORDER — ACETAMINOPHEN 650 MG RE SUPP: 650.0000 mg | Freq: Four times a day (QID) | RECTAL | Status: DC | PRN

## 2016-04-15 MED ORDER — SODIUM CHLORIDE 0.9% FLUSH: 3.0000 mL | INTRAVENOUS | Status: DC | PRN

## 2016-04-15 MED ORDER — SODIUM CHLORIDE 0.9% FLUSH
3.0000 mL | Freq: Two times a day (BID) | INTRAVENOUS | Status: DC
  Administered 2016-04-15: 10:00:00 3 mL via INTRAVENOUS

## 2016-04-15 MED ORDER — LOSARTAN POTASSIUM 50 MG PO TABS: 50.0000 mg | ORAL_TABLET | Freq: Every day | ORAL | Status: DC

## 2016-04-15 MED ORDER — CEPHALEXIN 500 MG PO CAPS: 500.0000 mg | ORAL_CAPSULE | Freq: Four times a day (QID) | ORAL | Status: DC

## 2016-04-15 MED ORDER — PREDNISONE 1 MG PO TABS
4.0000 mg | ORAL_TABLET | Freq: Every day | ORAL | Status: DC
  Filled 2016-04-15: qty 4

## 2016-04-15 MED ORDER — SODIUM CHLORIDE 0.9 % IV SOLN: 250.0000 mL | INTRAVENOUS | Status: DC | PRN

## 2016-04-15 MED ORDER — SODIUM CHLORIDE 0.9% FLUSH
3.0000 mL | Freq: Two times a day (BID) | INTRAVENOUS | Status: DC
  Administered 2016-04-15: 3 mL via INTRAVENOUS

## 2016-04-15 MED ORDER — METOPROLOL TARTRATE 50 MG PO TABS: 50.0000 mg | ORAL_TABLET | Freq: Two times a day (BID) | ORAL | Status: DC

## 2016-04-15 NOTE — Telephone Encounter (Signed)
Noted.  Will follow and schedule as appropriate.

## 2016-04-15 NOTE — H&P (Signed)
[name] is an 82 y.o. female.   Chief Complaint: Blurred vision HPI: Pt had episode of blurred vision that lasted about 30 minutes today. Has had symptoms in the past but never lasted this long and she said there was something different about this episode. She measured her blood pressure around this time which was 063 systolic. No headache, weakness or numbness or slurred speech noted.  Past Medical History  Diagnosis Date  . Hypertension   . Arthritis   . Carpal tunnel syndrome   . Osteoporosis   . 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 Hypothyroidism   . Wears glasses   . Wears hearing aid     Past Surgical History  Procedure Laterality Date  . Tonsillectomy    . Appendectomy    . Subdural hematoma evacuation via craniotomy  2007  . Colonoscopy    . Carpal tunnel release Left 07/11/2013    Procedure: CARPAL TUNNEL RELEASE;  Surgeon: RCammie Sickle, MD;  [facility]  Service: Orthopedics;  Laterality: Left;    Family History  Problem Relation Age of Onset  . Cancer Mother 954   breast ca  . Cancer Maternal Aunt   . Cancer Brother     pancreatic ca  . Breast cancer Sister   . Brain cancer Brother     astrocytoma?   Social History:  reports that she has never smoked. She does not have any smokeless tobacco history on file. She reports that she drinks alcohol. She reports that she does not use illicit drugs.  Allergies: No Known Allergies   (Not in a hospital admission)  Results for orders placed or performed during the hospital encounter of 04/14/16 (from the past 48 hour(s))  Protime-INR     Status: None   Collection Time: 04/14/16  6:18 PM  Result Value Ref Range   Prothrombin Time 13.0 11.4 - 15.0 seconds   INR 0.96   APTT     Status: None   Collection Time: 04/14/16  6:18 PM  Result Value Ref Range   aPTT 29 24 - 36 seconds  CBC     Status: Abnormal   Collection Time: 04/14/16  6:18 PM  Result Value Ref Range    WBC 11.1 (H) 3.6 - 11.0 K/uL   RBC 4.57 3.80 - 5.20 MIL/uL   Hemoglobin 13.8 12.0 - 16.0 g/dL   HCT 41.5 35.0 - 47.0 %   MCV 90.7 80.0 - 100.0 fL   MCH 30.1 26.0 - 34.0 pg   MCHC 33.2 32.0 - 36.0 g/dL   RDW 13.6 11.5 - 14.5 %   Platelets 257 150 - 440 K/uL  Differential     Status: Abnormal   Collection Time: 04/14/16  6:18 PM  Result Value Ref Range   Neutrophils Relative % 60 %   Neutro Abs 6.6 (H) 1.4 - 6.5 K/uL   Lymphocytes Relative 28 %   Lymphs Abs 3.1 1.0 - 3.6 K/uL   Monocytes Relative 9 %   Monocytes Absolute 1.0 (H) 0.2 - 0.9 K/uL   Eosinophils Relative 2 %   Eosinophils Absolute 0.3 0 - 0.7 K/uL   Basophils Relative 1 %   Basophils Absolute 0.1 0 - 0.1 K/uL  Comprehensive metabolic panel     Status: Abnormal   Collection Time: 04/14/16  6:18 PM  Result Value Ref Range   Sodium 136 135 - 145 mmol/L  Potassium 4.8 3.5 - 5.1 mmol/L   Chloride 101 101 - 111 mmol/L   CO2 26 22 - 32 mmol/L   Glucose, Bld 100 (H) 65 - 99 mg/dL   BUN 21 (H) 6 - 20 mg/dL   Creatinine, Ser 0.87 0.44 - 1.00 mg/dL   Calcium 9.0 8.9 - 10.3 mg/dL   Total Protein 7.3 6.5 - 8.1 g/dL   Albumin 4.4 3.5 - 5.0 g/dL   AST 22 15 - 41 U/L   ALT 19 14 - 54 U/L   Alkaline Phosphatase 73 38 - 126 U/L   Total Bilirubin 0.8 0.3 - 1.2 mg/dL   GFR calc non Af Amer 58 (L) >60 mL/min   GFR calc Af Amer >60 >60 mL/min    Comment: (NOTE) The eGFR has been calculated using the CKD EPI equation. This calculation has not been validated in all clinical situations. eGFR's persistently <60 mL/min signify possible Chronic Kidney Disease.    Anion gap 9 5 - 15  Troponin I     Status: None   Collection Time: 04/14/16  6:18 PM  Result Value Ref Range   Troponin I <0.03 <0.031 ng/mL    Comment:        NO INDICATION OF MYOCARDIAL INJURY.   Glucose, capillary     Status: None   Collection Time: 04/14/16  6:26 PM  Result Value Ref Range   Glucose-Capillary 83 65 - 99 mg/dL  Urinalysis complete, with  microscopic ([facility] only)     Status: Abnormal   Collection Time: 04/14/16  7:29 PM  Result Value Ref Range   Color, Urine YELLOW (A) YELLOW   APPearance CLOUDY (A) CLEAR   Glucose, UA NEGATIVE NEGATIVE mg/dL   Bilirubin Urine NEGATIVE NEGATIVE   Ketones, ur NEGATIVE NEGATIVE mg/dL   Specific Gravity, Urine 1.009 1.005 - 1.030   Hgb urine dipstick 1+ (A) NEGATIVE   pH 6.0 5.0 - 8.0   Protein, ur NEGATIVE NEGATIVE mg/dL   Nitrite NEGATIVE NEGATIVE   Leukocytes, UA 3+ (A) NEGATIVE   RBC / HPF 6-30 0 - 5 RBC/hpf   WBC, UA TOO NUMEROUS TO COUNT 0 - 5 WBC/hpf   Bacteria, UA NONE SEEN NONE SEEN   Squamous Epithelial / LPF 6-30 (A) NONE SEEN   Mucous PRESENT    Ct Head Wo Contrast  04/14/2016  CLINICAL DATA:  Acute onset of difficulty focusing and slurred speech. Initial encounter. EXAM: CT HEAD WITHOUT CONTRAST TECHNIQUE: Contiguous axial images were obtained from the base of the skull through the vertex without intravenous contrast. COMPARISON:  CT of the head performed 03/14/2010, and MRI of the brain performed 09/10/2011 FINDINGS: There is no evidence of acute infarction, mass lesion, or intra- or extra-axial hemorrhage on CT. Minimal periventricular and subcortical white matter change likely reflects small vessel ischemic microangiopathy. Prominence of the ventricles and sulci reflects mild cortical volume loss. Mild cerebellar atrophy is noted. The brainstem and fourth ventricle are within normal limits. The basal ganglia are unremarkable in appearance. The cerebral hemispheres demonstrate grossly normal gray-white differentiation. No mass effect or midline shift is seen. There is no evidence of fracture; a bore hole is noted at the high left posterior parietal calvarium. The orbits are within normal limits. The paranasal sinuses and mastoid air cells are well-aerated. No significant soft tissue abnormalities are seen. IMPRESSION: 1. No acute intracranial pathology seen on CT. 2. Minimal small  vessel ischemic microangiopathy and mild cortical volume loss. These results were called by  telephone at the time of interpretation on 04/14/2016 at 6:27 pm to Dr. Larae Grooms, who verbally acknowledged these results. Electronically Signed   By: Garald Balding M.D.   On: 04/14/2016 18:27   Mr Angiogram Neck W Wo Contrast  04/15/2016  CLINICAL DATA:  Difficulty focusing for 2 hours. History of subdural hematoma, hypertension. EXAM: MRI HEAD WITHOUT AND WITH CONTRAST. MRA NECK WITHOUT AND WITH CONTRAST TECHNIQUE: Multiplanar, multiecho pulse sequences of the brain and surrounding structures were obtained without and with intravenous contrast. Multiplanar, multiecho pulse sequences of the neck and surrounding structures were obtained without and with intravenous contrast. CONTRAST:  87m MULTIHANCE GADOBENATE DIMEGLUMINE 529 MG/ML IV SOLN COMPARISON:  CT HEAD April 22, 2016 at 1812 hours FINDINGS: MRI HEAD FINDINGS INTRACRANIAL CONTENTS: No reduced diffusion to suggest acute ischemia. No susceptibility artifact to suggest hemorrhage. The ventricles and sulci are normal for patient's age. Old small LEFT cerebellar infarct. Scattered supratentorial and to lesser extent pontine subcentimeter white matter FLAIR T2 hyperintensities are less than expected for age. Old small LEFT basal ganglia lacunar infarct. No suspicious parenchymal signal, masses or mass effect. No abnormal extra-axial fluid collections. No abnormal intracranial enhancement. No extra-axial masses though, contrast enhanced sequences would be more sensitive. Normal major intracranial vascular flow voids present at skull base. ORBITS: The included ocular globes and orbital contents are non-suspicious. Status post bilateral ocular lens implants. SINUSES: Paranasal sinuses are well-aerated. Small bilateral mastoid effusions. SKULL/SOFT TISSUES: No abnormal sellar expansion. No suspicious calvarial bone marrow signal. 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 Craniocervical junction maintained. Small RIGHT occipital scalp lipoma. MRI NECK FINDINGS Source images and MIP image were reviewed. The common carotid arteries are widely patent bilaterally. The carotid bifurcation is patent bilaterally and there is no significant carotid stenosis. Both vertebral arteries and patent to the basilar without significant vertebral stenosis. There is no evidence for atherosclerosis or flow limiting stenosis. IMPRESSION: MRI HEAD: No acute intracranial process. 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 Mild white matter changes compatible with chronic small vessel ischemic disease. MRA NECK: Negative. Electronically Signed   By: CElon AlasM.D.   On: 04/15/2016 01:17   Mr BJeri CosWIOContrast  04/15/2016  CLINICAL DATA:  Difficulty focusing for 2 hours. History of subdural hematoma, hypertension. EXAM: MRI HEAD WITHOUT AND WITH CONTRAST. MRA NECK WITHOUT AND WITH CONTRAST TECHNIQUE: Multiplanar, multiecho pulse sequences of the brain and surrounding structures were obtained without and with intravenous contrast. Multiplanar, multiecho pulse sequences of the neck and surrounding structures were obtained without and with intravenous contrast. CONTRAST:  143mMULTIHANCE GADOBENATE DIMEGLUMINE 529 MG/ML IV SOLN COMPARISON:  CT HEAD April 22, 2016 at 1812 hours FINDINGS: MRI HEAD FINDINGS INTRACRANIAL CONTENTS: No reduced diffusion to suggest acute ischemia. No susceptibility artifact to suggest hemorrhage. The ventricles and sulci are normal for patient's age. Old small LEFT cerebellar infarct. Scattered supratentorial and to lesser extent pontine subcentimeter white matter FLAIR T2 hyperintensities are less than expected for age. Old small LEFT basal ganglia lacunar infarct. No suspicious parenchymal signal, masses or mass effect. No abnormal extra-axial fluid collections. No abnormal intracranial  enhancement. No extra-axial masses though, contrast enhanced sequences would be more sensitive. Normal major intracranial vascular flow voids present at skull base. ORBITS: The included ocular globes and orbital contents are non-suspicious. Status post bilateral ocular lens implants. SINUSES: Paranasal sinuses are well-aerated. Small bilateral mastoid effusions. SKULL/SOFT TISSUES: No abnormal  sellar expansion. No suspicious calvarial bone marrow signal. 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 Craniocervical junction maintained. Small RIGHT occipital scalp lipoma. MRI NECK FINDINGS Source images and MIP image were reviewed. The common carotid arteries are widely patent bilaterally. The carotid bifurcation is patent bilaterally and there is no significant carotid stenosis. Both vertebral arteries and patent to the basilar without significant vertebral stenosis. There is no evidence for atherosclerosis or flow limiting stenosis. IMPRESSION: MRI HEAD: No acute intracranial process. 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 Mild white matter changes compatible with chronic small vessel ischemic disease. MRA NECK: Negative. Electronically Signed   By: Elon Alas M.D.   On: 04/15/2016 01:17    Review of Systems  Constitutional: Negative for fever and chills.  HENT: Negative for hearing loss.   Eyes: Positive for blurred vision.  Respiratory: Negative for cough and shortness of breath.   Cardiovascular: Negative for chest pain and leg swelling.  Gastrointestinal: Negative for nausea and vomiting.  Genitourinary: Negative for dysuria.  Musculoskeletal: Negative for back pain.  Skin: Negative for rash.  Neurological: Negative for sensory change.    Blood pressure 149/54, pulse 73, temperature 97.9 F (36.6 C), temperature source Oral, resp. rate 12, height _0  (1.651 m), weight 63.504 kg (140 lb), SpO2 99 %. Physical  Exam  Constitutional: She is oriented to person, place, and time. She appears well-developed and well-nourished. No distress.  HENT:  Head: Normocephalic and atraumatic.  Mouth/Throat: Oropharynx is clear and moist. No oropharyngeal exudate.  Eyes: EOM are normal. Pupils are equal, round, and reactive to light. No scleral icterus.  Neck: Neck supple. No JVD present. No tracheal deviation present. No thyromegaly present.  Cardiovascular: Normal rate.   No murmur heard. Respiratory: Effort normal and breath sounds normal. No respiratory distress. She has no wheezes.  GI: Soft. Bowel sounds are normal. There is no tenderness.  Musculoskeletal: She exhibits no edema or tenderness.  Lymphadenopathy:    She has no cervical adenopathy.  Neurological: She is alert and oriented to person, place, and time. No cranial nerve deficit.  Skin: Skin is warm and dry. No erythema.     Assessment/Plan 1. TIA: Symptoms may indicate TIA. Patient could not tell me if this was both eyes or one. Had MRI of brain and MRA of carotid done in ED. Showed old lacunar infarcts and patent carotids. Will continue ASA. Observe and get echo to finish work up.   2. Uncontrolled HTN: Consider increasing norvasc. Symptoms may have been related to elevated BP and would explain lacunar infarcts.  3. Hypothyroidism: On synthroid  Time spent= 30 min  Baxter Hire, MD 04/15/2016, 3:10 AM

## 2016-04-15 NOTE — Care Management Obs Status (Signed)
[facility] NOTIFICATION   Patient Details  Name: [name] MRN: [id number] Date of Birth: [birth year]   Medicare Observation Status Notification Given:  Yes    Shelbie Ammons, RN 04/15/2016, 9:01 AM

## 2016-04-15 NOTE — ED Provider Notes (Signed)
-----------------------------------------   2:32 AM on 04/15/2016 -----------------------------------------  MRI of the brain shows old left cerebellar and left basal ganglia infarcts. The patient denies any prior history of CVA and prior MRI did not show these infarcts. At this time, her symptoms have resolved but I'm concerned for TIA and given radiological evidence of old stroke, she certainly is at risk for major stroke which could result in permanent deficits. I discussed the case with the hospitalist for admission.  Joanne Gavel, MD 04/15/16 [phone]

## 2016-04-15 NOTE — Progress Notes (Signed)
  Echocardiogram 2D Echocardiogram has been performed.  [name] 04/15/2016, 3:08 PM

## 2016-04-15 NOTE — Care Management (Signed)
Admitted to [facility] under observation status with the diagnosis of TIA. Lives alone. Daughter is Luther Hearing [phone]). No home health. No skilled facility. No home oxygen. Takes care of all basic and instrumental activities of daily living herself, drives. Last seen Dr. Derrel Nip a couple of weeks ago. Prescriptions are filled at [facility] in [place]. Appetite "too good." No falls. Friend will transport. Shelbie Ammons RN MSN CCM Care Management [phone]

## 2016-04-15 NOTE — Progress Notes (Signed)
Patient took her own medications for synthroid, metoprolol, prednisone, and losartan.  Educated that she should only be taking medications that hospital delivers.

## 2016-04-15 NOTE — Progress Notes (Signed)
Patient is alert and oriented.  VSS.  Removed telemetry and PIV.  Patient escorted out of hospital via wheelchair by volunteers.

## 2016-04-15 NOTE — Discharge Instructions (Signed)

## 2016-04-15 NOTE — ED Notes (Signed)
Pt's family refused Rocephin, state they do not want to treat an asymptomatic UTI, state they would prefer to follow up with primary doctor. EDP made aware

## 2016-04-15 NOTE — Telephone Encounter (Signed)
HFU, Pt is being discharged today. Dx is TIA. No appt avail to sch. Pt needs to be seen in 1 week. Thank you!

## 2016-04-15 NOTE — Discharge Summary (Signed)
[name], 82 y.o., DOB Sep 18, 1927, MRN [id number]. Admission date: 04/14/2016 Discharge Date 04/15/2016 Primary MD Crecencio Mc, MD Admitting Physician Baxter Hire, MD  Admission Diagnosis  UTI (lower urinary tract infection) [N39.0] Blurred vision, bilateral [H53.8] Essential hypertension [I10] Transient cerebral ischemia, unspecified transient cerebral ischemia type [G45.9]  Discharge Diagnosis   Active Problems:   TIA (transient ischemic attack)   UTI   Essential hypertension  Osteoarthritis  Osteoporosis gait instability Hypothyroidism       Hospital Course patient is a 82 year old white female with history of TIA in the past who presented with blurred vision that lasted about 30 minutes. Patient was seen in the ED Had a CT scan of the head which was negative. She was admitted under observation and subsequently had MRI of the brain and MRA of the neck. Which failed to show any acute CVA. This showed old stroke. There was no significant stenosis. Patient currently asymptomatic and back to baseline. She was noted to have UTI which is currently being treated with antibiotics. Once patient has a echocardiogram she'll be discharged home.           Consults  None  Significant Tests:  See full reports for all details      Ct Head Wo Contrast  04/14/2016  CLINICAL DATA:  Acute onset of difficulty focusing and slurred speech. Initial encounter. EXAM: CT HEAD WITHOUT CONTRAST TECHNIQUE: Contiguous axial images were obtained from the base of the skull through the vertex without intravenous contrast. COMPARISON:  CT of the head performed 03/14/2010, and MRI of the brain performed 09/10/2011 FINDINGS: There is no evidence of acute infarction, mass lesion, or intra- or extra-axial hemorrhage on CT. Minimal periventricular and subcortical white matter change likely reflects small vessel ischemic microangiopathy. Prominence of the ventricles and sulci reflects mild cortical  volume loss. Mild cerebellar atrophy is noted. The brainstem and fourth ventricle are within normal limits. The basal ganglia are unremarkable in appearance. The cerebral hemispheres demonstrate grossly normal gray-white differentiation. No mass effect or midline shift is seen. There is no evidence of fracture; a bore hole is noted at the high left posterior parietal calvarium. The orbits are within normal limits. The paranasal sinuses and mastoid air cells are well-aerated. No significant soft tissue abnormalities are seen. IMPRESSION: 1. No acute intracranial pathology seen on CT. 2. Minimal small vessel ischemic microangiopathy and mild cortical volume loss. These results were called by telephone at the time of interpretation on 04/14/2016 at 6:27 pm to Dr. Larae Grooms, who verbally acknowledged these results. Electronically Signed   By: Garald Balding M.D.   On: 04/14/2016 18:27   Mr Angiogram Neck W Wo Contrast  04/15/2016  CLINICAL DATA:  Difficulty focusing for 2 hours. History of subdural hematoma, hypertension. EXAM: MRI HEAD WITHOUT AND WITH CONTRAST. MRA NECK WITHOUT AND WITH CONTRAST TECHNIQUE: Multiplanar, multiecho pulse sequences of the brain and surrounding structures were obtained without and with intravenous contrast. Multiplanar, multiecho pulse sequences of the neck and surrounding structures were obtained without and with intravenous contrast. CONTRAST:  54mL MULTIHANCE GADOBENATE DIMEGLUMINE 529 MG/ML IV SOLN COMPARISON:  CT HEAD April 22, 2016 at 1812 hours FINDINGS: MRI HEAD FINDINGS INTRACRANIAL CONTENTS: No reduced diffusion to suggest acute ischemia. No susceptibility artifact to suggest hemorrhage. The ventricles and sulci are normal for patient's age. Old small LEFT cerebellar infarct. Scattered supratentorial and to lesser extent pontine subcentimeter white matter FLAIR T2 hyperintensities are less than expected for age. Old small LEFT  basal ganglia lacunar infarct. No suspicious  parenchymal signal, masses or mass effect. No abnormal extra-axial fluid collections. No abnormal intracranial enhancement. No extra-axial masses though, contrast enhanced sequences would be more sensitive. Normal major intracranial vascular flow voids present at skull base. ORBITS: The included ocular globes and orbital contents are non-suspicious. Status post bilateral ocular lens implants. SINUSES: Paranasal sinuses are well-aerated. Small bilateral mastoid effusions. SKULL/SOFT TISSUES: No abnormal sellar expansion. No suspicious calvarial bone marrow signal. 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 Craniocervical junction maintained. Small RIGHT occipital scalp lipoma. MRI NECK FINDINGS Source images and MIP image were reviewed. The common carotid arteries are widely patent bilaterally. The carotid bifurcation is patent bilaterally and there is no significant carotid stenosis. Both vertebral arteries and patent to the basilar without significant vertebral stenosis. There is no evidence for atherosclerosis or flow limiting stenosis. IMPRESSION: MRI HEAD: No acute intracranial process. 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 Mild white matter changes compatible with chronic small vessel ischemic disease. MRA NECK: Negative. Electronically Signed   By: Elon Alas M.D.   On: 04/15/2016 01:17   Mr Jeri Cos [id number] Contrast  04/15/2016  CLINICAL DATA:  Difficulty focusing for 2 hours. History of subdural hematoma, hypertension. EXAM: MRI HEAD WITHOUT AND WITH CONTRAST. MRA NECK WITHOUT AND WITH CONTRAST TECHNIQUE: Multiplanar, multiecho pulse sequences of the brain and surrounding structures were obtained without and with intravenous contrast. Multiplanar, multiecho pulse sequences of the neck and surrounding structures were obtained without and with intravenous contrast. CONTRAST:  68mL MULTIHANCE GADOBENATE DIMEGLUMINE 529 MG/ML IV  SOLN COMPARISON:  CT HEAD April 22, 2016 at 1812 hours FINDINGS: MRI HEAD FINDINGS INTRACRANIAL CONTENTS: No reduced diffusion to suggest acute ischemia. No susceptibility artifact to suggest hemorrhage. The ventricles and sulci are normal for patient's age. Old small LEFT cerebellar infarct. Scattered supratentorial and to lesser extent pontine subcentimeter white matter FLAIR T2 hyperintensities are less than expected for age. Old small LEFT basal ganglia lacunar infarct. No suspicious parenchymal signal, masses or mass effect. No abnormal extra-axial fluid collections. No abnormal intracranial enhancement. No extra-axial masses though, contrast enhanced sequences would be more sensitive. Normal major intracranial vascular flow voids present at skull base. ORBITS: The included ocular globes and orbital contents are non-suspicious. Status post bilateral ocular lens implants. SINUSES: Paranasal sinuses are well-aerated. Small bilateral mastoid effusions. SKULL/SOFT TISSUES: No abnormal sellar expansion. No suspicious calvarial bone marrow signal. 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 Craniocervical junction maintained. Small RIGHT occipital scalp lipoma. MRI NECK FINDINGS Source images and MIP image were reviewed. The common carotid arteries are widely patent bilaterally. The carotid bifurcation is patent bilaterally and there is no significant carotid stenosis. Both vertebral arteries and patent to the basilar without significant vertebral stenosis. There is no evidence for atherosclerosis or flow limiting stenosis. IMPRESSION: MRI HEAD: No acute intracranial process. 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 Mild white matter changes compatible with chronic small vessel ischemic disease. MRA NECK: Negative. Electronically Signed   By: Elon Alas M.D.   On: 04/15/2016 01:17       Today   Subjective:   [name] feels  well denies any symptoms  Objective:   Blood pressure 128/49, pulse 66, temperature 98.4 F (36.9 C), temperature source Oral, resp. rate 18, height 5\' 5"  (1.651 m), weight 63.504 kg (140 lb), SpO2 98 %.  [name]  Intake/Output Summary (Last 24 hours) at 04/15/16 1112 Last data filed at 04/15/16 0900  Gross per 24 hour  Intake    240 ml  Output    100 ml  Net    140 ml    Exam VITAL SIGNS: Blood pressure 128/49, pulse 66, temperature 98.4 F (36.9 C), temperature source Oral, resp. rate 18, height 5\' 5"  (1.651 m), weight 63.504 kg (140 lb), SpO2 98 %.  GENERAL:  82 y.o.-year-old patient lying in the bed with no acute distress.  EYES: Pupils equal, round, reactive to light and accommodation. No scleral icterus. Extraocular muscles intact.  HEENT: Head atraumatic, normocephalic. Oropharynx and nasopharynx clear.  NECK:  Supple, no jugular venous distention. No thyroid enlargement, no tenderness.  LUNGS: Normal breath sounds bilaterally, no wheezing, rales,rhonchi or crepitation. No use of accessory muscles of respiration.  CARDIOVASCULAR: S1, S2 normal. No murmurs, rubs, or gallops.  ABDOMEN: Soft, nontender, nondistended. Bowel sounds present. No organomegaly or mass.  EXTREMITIES: No pedal edema, cyanosis, or clubbing.  NEUROLOGIC: Cranial nerves II through XII are intact. Muscle strength 5/5 in all extremities. Sensation intact. Gait not checked.  PSYCHIATRIC: The patient is alert and oriented x 3.  SKIN: No obvious rash, lesion, or ulcer.   Data Review     CBC w Diff: Lab Results  Component Value Date   WBC 11.1* 04/14/2016   HGB 13.8 04/14/2016   HCT 41.5 04/14/2016   PLT 257 04/14/2016   LYMPHOPCT 28 04/14/2016   MONOPCT 9 04/14/2016   EOSPCT 2 04/14/2016   BASOPCT 1 04/14/2016   CMP: Lab Results  Component Value Date   NA 136 04/14/2016   K 4.8 04/14/2016   CL 101 04/14/2016   CO2 26 04/14/2016   BUN 21* 04/14/2016   CREATININE 0.87 04/14/2016   PROT 7.3 04/14/2016    ALBUMIN 4.4 04/14/2016   BILITOT 0.8 04/14/2016   ALKPHOS 73 04/14/2016   AST 22 04/14/2016   ALT 19 04/14/2016  .  Micro Results No results found for this or any previous visit (from the past 240 hour(s)).      Code Status Orders        Start     Ordered   04/15/16 0424  Full code   Continuous     04/15/16 0423    Code Status History    Date Active Date Inactive Code Status Order ID Comments User Context   This patient has a current code status but no historical code status.    Advance Directive Documentation        Most Recent Value   Type of Advance Directive  Healthcare Power of Attorney, Living will   Pre-existing out of facility DNR order (yellow form or pink MOST form)     "MOST" Form in Place?            Follow-up Information    Follow up with Crecencio Mc, MD. Go on 04/22/2016.   Specialty:  Internal Medicine   Contact information:   [place] [id number] [phone]       Discharge Medications     Medication List    TAKE these medications        amLODipine 2.5 MG tablet  Commonly known as:  NORVASC  Take 1 tablet (2.5 mg total) by mouth daily.     aspirin EC 81 MG tablet  Take 81 mg by mouth every evening.     cephALEXin 500 MG capsule  Commonly known as:  KEFLEX  Take 1 capsule (500 mg total) by mouth 4 (four) times daily.     levothyroxine 100 MCG tablet  Commonly known as:  SYNTHROID, LEVOTHROID  Take 100 mcg by mouth daily before breakfast.     losartan 50 MG tablet  Commonly known as:  COZAAR  Take 1 tablet (50 mg total) by mouth daily.     metoprolol tartrate 25 MG tablet  Commonly known as:  LOPRESSOR  Take 2 tablets (50 mg total) by mouth 2 (two) times daily.     predniSONE 1 MG tablet  Commonly known as:  DELTASONE  Take 4 mg by mouth daily with breakfast.           Total Time in preparing paper work, data evaluation and todays exam - 35 minutes  Dustin Flock M.D on  04/15/2016 at [place]  [phone]

## 2016-04-16 ENCOUNTER — Telehealth: Payer: Self-pay

## 2016-04-16 ENCOUNTER — Telehealth: Payer: Self-pay | Admitting: Internal Medicine

## 2016-04-16 LAB — ECHOCARDIOGRAM COMPLETE
AOPV: 0.6 m/s
AV Area VTI: 1.37 cm2
AV peak Index: 0.8
AV pk vel: 165 cm/s
AVPG: 11 mmHg
CHL CUP MV DEC (S): 289
E decel time: 289 msec
E/e' ratio: 15.74
FS: 38 % (ref 28–44)
Height: 65 in
IVS/LV PW RATIO, ED: 1.28
LA ID, A-P, ES: 35 mm
LA diam index: 2.04 cm/m2
LA vol A4C: 42.4 ml
LA vol index: 24 mL/m2
LA vol: 41.1 mL
LDCA: 2.27 cm2
LEFT ATRIUM END SYS DIAM: 35 mm
LV PW d: 9.45 mm — AB (ref 0.6–1.1)
LV TDI E'LATERAL: 5.98
LV TDI E'MEDIAL: 5.98
LVEEAVG: 15.74
LVEEMED: 15.74
LVELAT: 5.98 cm/s
LVOT diameter: 17 mm
LVOTPV: 99.5 cm/s
MV Peak grad: 4 mmHg
MV pk A vel: 133 m/s
MV pk E vel: 94.1 m/s
TAPSE: 23.6 mm
Weight: 2240 oz

## 2016-04-16 LAB — URINE CULTURE

## 2016-04-16 NOTE — Telephone Encounter (Signed)
HFU, Pt was discharged from the hospital. Dx was High blood pressure/two small strokes. Pt is scheduled for 06/29 @ 1:30pm. Thank you!

## 2016-04-16 NOTE — Telephone Encounter (Signed)
Transition Care Management Follow-up Telephone Call   Date discharged? 04/15/16   How have you been since you were released from the hospital? I FEEL GREAT! I AM HAVING NO PAIN, FACIAL DROOPING, SLURRED SPEECH, NO ONE SIDED WEAKNESS.  BOTH KNEES ARE JUST A LITTLE WEAK BUT THAT IS IT.  DRINKING PLENTY OF FLUIDS AND TOILETING WITHOUT ISSUES.    Do you understand why you were in the hospital? YES, UTI, HIGH BLOOD PRESSURE, TIA.   Do you understand the discharge instructions? YES, DRINK PLENTY OF FLUIDS AND INCREASE ACTIVITY AS TOLERATED.   Where were you discharged to? HOME.   Items Reviewed:  Medications reviewed: YES, CONTINUING ALL SCHEDULED MEDICATIONS AS DIRECTED.  NO CHANGES.  Allergies reviewed: YES, NO KNOWN ALLERGIES.  Dietary changes reviewed: YES, CARDIAC DIET. NO ISSUES.  Referrals reviewed: YES, APPOINTMENT SCHEDULED WITH PCP.    Functional Questionnaire:   Activities of Daily Living (ADLs):   She states they are independent in the following: INDEPENDENT IN ALL ADLS. States they require assistance with the following: DOES NOT REQUIRE ASSISTANCE AT THIS TIME.   Any transportation issues/concerns?: NO.   Any patient concerns? AWAITING URINALYSIS RESULTS.   Confirmed importance and date/time of follow-up visits scheduled YES, APPOINTMENT SCHEDULED 04/23/16 AT 1:30.  Provider Appointment booked with Dr. Derrel Nip (PCP).  Confirmed with patient if condition begins to worsen call PCP or go to the ER.  Patient was given the office number and encouraged to call back with question or concerns.  : YES, VERBALIZED UNDERSTANDING.

## 2016-04-16 NOTE — Telephone Encounter (Signed)
Noted.  Will continue to follow up with transitional care management as appropriate.

## 2016-04-17 ENCOUNTER — Other Ambulatory Visit (INDEPENDENT_AMBULATORY_CARE_PROVIDER_SITE_OTHER): Payer: Medicare Other

## 2016-04-17 ENCOUNTER — Telehealth: Payer: Self-pay | Admitting: Internal Medicine

## 2016-04-17 DIAGNOSIS — R358 Other polyuria: Secondary | ICD-10-CM

## 2016-04-17 DIAGNOSIS — R3589 Other polyuria: Secondary | ICD-10-CM

## 2016-04-17 LAB — POCT URINALYSIS DIPSTICK
BILIRUBIN UA: NEGATIVE
Glucose, UA: NEGATIVE
KETONES UA: NEGATIVE
Nitrite, UA: NEGATIVE
PH UA: 5.5
PROTEIN UA: NEGATIVE
SPEC GRAV UA: 1.015
Urobilinogen, UA: 0.2

## 2016-04-17 LAB — URINALYSIS, ROUTINE W REFLEX MICROSCOPIC
Bilirubin Urine: NEGATIVE
KETONES UR: NEGATIVE
Nitrite: NEGATIVE
SPECIFIC GRAVITY, URINE: 1.01 (ref 1.000–1.030)
TOTAL PROTEIN, URINE-UPE24: NEGATIVE
URINE GLUCOSE: NEGATIVE
UROBILINOGEN UA: 0.2 (ref 0.0–1.0)
pH: 6 (ref 5.0–8.0)

## 2016-04-17 NOTE — Telephone Encounter (Signed)
Hospital called patient requesting a recollect on culture and UA, patient did not want to go to ER for recollect, patient coming to office for recollect, i have ordered formal UA and culture.FYI patient is scheduled to see MD 6/29 for follow up from hospital.

## 2016-04-18 LAB — URINE CULTURE

## 2016-04-19 ENCOUNTER — Telehealth: Payer: Self-pay | Admitting: Internal Medicine

## 2016-04-19 DIAGNOSIS — N3090 Cystitis, unspecified without hematuria: Secondary | ICD-10-CM

## 2016-04-19 NOTE — Telephone Encounter (Signed)
Her repeat urine test again shows inflammation without infection.  She will need to see a urologist for futher evaluation.  Referral is in process

## 2016-04-20 ENCOUNTER — Other Ambulatory Visit: Payer: Self-pay | Admitting: Internal Medicine

## 2016-04-20 ENCOUNTER — Telehealth: Payer: Self-pay

## 2016-04-20 ENCOUNTER — Other Ambulatory Visit: Payer: Medicare Other

## 2016-04-20 MED ORDER — PHENAZOPYRIDINE HCL 200 MG PO TABS: 200.0000 mg | ORAL_TABLET | Freq: Three times a day (TID) | ORAL | Status: DC | PRN

## 2016-04-20 MED ORDER — TAMSULOSIN HCL 0.4 MG PO CAPS: 0.4000 mg | ORAL_CAPSULE | Freq: Every day | ORAL | Status: DC

## 2016-04-20 NOTE — Telephone Encounter (Signed)
FYI Pt reports BP readings high at 194/91 over the weekend.  Today reads at 162/81.  Drinking plenty of fluids.  Urinating at a trickle.  No pain.  No swelling in feet or legs.  Has been contacted by Urology and appointment is scheduled for the following week.  She plans to keep upcoming hospital follow up appointment and see PCP this Thursday.

## 2016-04-20 NOTE — Addendum Note (Signed)
Addended by: Crecencio Mc on: 04/20/2016 05:32 PM   Modules accepted: Orders

## 2016-04-20 NOTE — Telephone Encounter (Signed)
I have also called in Flomax to take daily.  This may help her void a little easier

## 2016-04-21 ENCOUNTER — Telehealth: Payer: Self-pay | Admitting: Internal Medicine

## 2016-04-21 NOTE — Telephone Encounter (Signed)
Prior authorization started on cover my meds for Pyridium.

## 2016-04-21 NOTE — Telephone Encounter (Signed)
Notified patient and started PA for pyridium see documentation.

## 2016-04-23 ENCOUNTER — Ambulatory Visit (INDEPENDENT_AMBULATORY_CARE_PROVIDER_SITE_OTHER): Payer: Medicare Other | Admitting: Internal Medicine

## 2016-04-23 ENCOUNTER — Encounter: Payer: Self-pay | Admitting: Internal Medicine

## 2016-04-23 VITALS — BP 104/62 | HR 78 | Temp 97.6°F | Resp 14 | Ht 65.0 in | Wt 145.0 lb

## 2016-04-23 DIAGNOSIS — G458 Other transient cerebral ischemic attacks and related syndromes: Secondary | ICD-10-CM

## 2016-04-23 DIAGNOSIS — E785 Hyperlipidemia, unspecified: Secondary | ICD-10-CM | POA: Diagnosis not present

## 2016-04-23 DIAGNOSIS — Z09 Encounter for follow-up examination after completed treatment for conditions other than malignant neoplasm: Secondary | ICD-10-CM

## 2016-04-23 DIAGNOSIS — I10 Essential (primary) hypertension: Secondary | ICD-10-CM

## 2016-04-23 DIAGNOSIS — E034 Atrophy of thyroid (acquired): Secondary | ICD-10-CM | POA: Diagnosis not present

## 2016-04-23 DIAGNOSIS — E038 Other specified hypothyroidism: Secondary | ICD-10-CM

## 2016-04-23 DIAGNOSIS — N3090 Cystitis, unspecified without hematuria: Secondary | ICD-10-CM

## 2016-04-23 DIAGNOSIS — M353 Polymyalgia rheumatica: Secondary | ICD-10-CM

## 2016-04-23 LAB — COMPREHENSIVE METABOLIC PANEL
ALBUMIN: 4.1 g/dL (ref 3.5–5.2)
ALK PHOS: 60 U/L (ref 39–117)
ALT: 15 U/L (ref 0–35)
AST: 17 U/L (ref 0–37)
BILIRUBIN TOTAL: 0.4 mg/dL (ref 0.2–1.2)
BUN: 17 mg/dL (ref 6–23)
CALCIUM: 9.2 mg/dL (ref 8.4–10.5)
CO2: 27 meq/L (ref 19–32)
CREATININE: 0.91 mg/dL (ref 0.40–1.20)
Chloride: 103 mEq/L (ref 96–112)
GFR: 62.14 mL/min (ref >60.00)
Glucose, Bld: 101 mg/dL — ABNORMAL HIGH (ref 70–99)
Potassium: 5 mEq/L (ref 3.5–5.1)
Sodium: 136 mEq/L (ref 135–145)
TOTAL PROTEIN: 6.7 g/dL (ref 6.0–8.3)

## 2016-04-23 LAB — TSH: TSH: 1.93 u[IU]/mL (ref 0.35–4.50)

## 2016-04-23 LAB — LDL CHOLESTEROL, DIRECT: LDL DIRECT: 137 mg/dL

## 2016-04-23 MED ORDER — ATORVASTATIN CALCIUM 20 MG PO TABS: 20.0000 mg | ORAL_TABLET | Freq: Every day | ORAL | Status: DC

## 2016-04-23 MED ORDER — PREDNISONE 1 MG PO TABS: 3.0000 mg | ORAL_TABLET | Freq: Every day | ORAL | Status: DC

## 2016-04-23 NOTE — Patient Instructions (Addendum)
Your blood pressure is better controlled with losartan 50 mg daily   You can continue alternating 5 mg with 3 mg on the prednisone   i recommend a low dose of Lipitor to prevent stroke.  Continue  Aspirin daily  Following your Urology referral  To rule out interstitial cystitis, we will decide whether to add Plavix   Please return in 6 weeks for liver tests   Stop using any medications with benadryl in it (dipenhydramine )  Please see you eye doctor ASAP

## 2016-04-23 NOTE — Progress Notes (Signed)
Subjective:  Patient ID: [name], female    DOB: October 21, 1927  Age: 82 y.o. MRN: [id number]  CC: The primary encounter diagnosis was Hypothyroidism due to acquired atrophy of thyroid. Diagnoses of Hyperlipidemia, Essential hypertension, Other specified transient cerebral ischemias, PMR (polymyalgia rheumatica) [facility]), Hospital discharge follow-up, and Cystitis were also pertinent to this visit.  HPI [name] presents for hospital follow up,  She was admitted to [facility] on   June 20 and d/cd on June 21 . Admission  diagnosis was TIA with symptoms of bliateral blurred vision.   Workup for CVA included MRI and MRA brain which shoed an old stroke and  was negative for significant stenosis . ECHO was normal  Except for mild concentric hypertrophy of LV.  During admission She reported  decreased urine stream, without dysuria, so UA was checked,. Dipstick was positive,  But it was not a clean catch and her daughter who is an Therapist, sports requested no antibiotics be started unless  culture was positive. Une culture was negative and she was discharged to orders to repeat a urine collection. Which has been done  Again shows inflammation without infection  She feels generally well.  She continues to report urinary hesitation and occasional blurred vision. .  medications reviewed.  She Uses Tylenol PM for insomnia once or twice per week.  BP has been elevated since discharge.: medication change:  Amlodipine was continued at 2.5 mg daily and losartan started at 50 mg daily. .  Home BPs up to 140/80 on current regimen .  Lab Results  Component Value Date   TSH 1.93 04/23/2016     Outpatient Prescriptions Prior to Visit  Medication Sig Dispense Refill  . aspirin EC 81 MG tablet Take 81 mg by mouth every evening.    [name] levothyroxine (SYNTHROID, LEVOTHROID) 100 MCG tablet Take 100 mcg by mouth daily before breakfast.    . losartan (COZAAR) 50 MG tablet Take 1 tablet (50 mg total) by mouth daily. 30 tablet 3  .  metoprolol tartrate (LOPRESSOR) 25 MG tablet Take 2 tablets (50 mg total) by mouth 2 (two) times daily. 120 tablet 4  . predniSONE (DELTASONE) 1 MG tablet Take 5 mg by mouth daily with breakfast.    . tamsulosin (FLOMAX) 0.4 MG CAPS capsule Take 1 capsule (0.4 mg total) by mouth daily. 30 capsule 3  . amLODipine (NORVASC) 2.5 MG tablet Take 1 tablet (2.5 mg total) by mouth daily. (Patient not taking: Reported on 04/23/2016) 90 tablet 3  . cephALEXin (KEFLEX) 500 MG capsule Take 1 capsule (500 mg total) by mouth 4 (four) times daily. (Patient not taking: Reported on 04/23/2016) 20 capsule 0  . phenazopyridine (PYRIDIUM) 200 MG tablet Take 1 tablet (200 mg total) by mouth 3 (three) times daily as needed for pain. (Patient not taking: Reported on 04/23/2016) 30 tablet 0   No facility-administered medications prior to visit.    Review of Systems;  Patient denies headache, fevers, malaise, unintentional weight loss, skin rash, eye pain, sinus congestion and sinus pain, sore throat, dysphagia,  hemoptysis , cough, dyspnea, wheezing, chest pain, palpitations, orthopnea, edema, abdominal pain, nausea, melena, diarrhea, constipation, flank pain, dysuria, hematuria, urinary  Frequency, nocturia, numbness, tingling, seizures,  Focal weakness, Loss of consciousness,  Tremor, insomnia, depression, anxiety, and suicidal ideation.      Objective:  BP 104/62 mmHg  Pulse 78  Temp(Src) 97.6 F (36.4 C) (Oral)  Resp 14  Ht 5\' 5"  (1.651 m)  Wt 145  lb (65.772 kg)  BMI 24.13 kg/m2  SpO2 97%  BP Readings from Last 3 Encounters:  04/23/16 104/62  04/15/16 128/49  03/09/16 142/82    Wt Readings from Last 3 Encounters:  04/23/16 145 lb (65.772 kg)  04/14/16 140 lb (63.504 kg)  04/14/16 140 lb (63.504 kg)    General appearance: alert, cooperative and appears stated age Ears: normal TM's and external ear canals both ears Throat: lips, mucosa, and tongue normal; teeth and gums normal Neck: no adenopathy,  no carotid bruit, supple, symmetrical, trachea midline and thyroid not enlarged, symmetric, no tenderness/mass/nodules Back: symmetric, no curvature. ROM normal. No CVA tenderness. Lungs: clear to auscultation bilaterally Heart: regular rate and rhythm, S1, S2 normal, no murmur, click, rub or gallop Abdomen: soft, non-tender; bowel sounds normal; no masses,  no organomegaly Pulses: 2+ and symmetric Skin: Skin color, texture, turgor normal. No rashes or lesions Lymph nodes: Cervical, supraclavicular, and axillary nodes normal.  Lab Results  Component Value Date   HGBA1C 5.8 09/13/2015   HGBA1C 6.0 03/05/2015    Lab Results  Component Value Date   CREATININE 0.91 04/23/2016   CREATININE 0.87 04/14/2016   CREATININE 0.81 03/09/2016    Lab Results  Component Value Date   WBC 11.1* 04/14/2016   HGB 13.8 04/14/2016   HCT 41.5 04/14/2016   PLT 257 04/14/2016   GLUCOSE 101* 04/23/2016   CHOL 216* 09/13/2015   TRIG 148.0 09/13/2015   HDL 56.00 09/13/2015   LDLDIRECT 137.0 04/23/2016   LDLCALC 130* 09/13/2015   ALT 15 04/23/2016   AST 17 04/23/2016   NA 136 04/23/2016   K 5.0 04/23/2016   CL 103 04/23/2016   CREATININE 0.91 04/23/2016   BUN 17 04/23/2016   CO2 27 04/23/2016   TSH 1.93 04/23/2016   INR 0.96 04/14/2016   HGBA1C 5.8 09/13/2015    Mr Angiogram Neck W Wo Contrast  04/15/2016  CLINICAL DATA:  Difficulty focusing for 2 hours. History of subdural hematoma, hypertension. EXAM: MRI HEAD WITHOUT AND WITH CONTRAST. MRA NECK WITHOUT AND WITH CONTRAST TECHNIQUE: Multiplanar, multiecho pulse sequences of the brain and surrounding structures were obtained without and with intravenous contrast. Multiplanar, multiecho pulse sequences of the neck and surrounding structures were obtained without and with intravenous contrast. CONTRAST:  90mL MULTIHANCE GADOBENATE DIMEGLUMINE 529 MG/ML IV SOLN COMPARISON:  CT HEAD April 22, 2016 at 1812 hours FINDINGS: MRI HEAD FINDINGS INTRACRANIAL  CONTENTS: No reduced diffusion to suggest acute ischemia. No susceptibility artifact to suggest hemorrhage. The ventricles and sulci are normal for patient's age. Old small LEFT cerebellar infarct. Scattered supratentorial and to lesser extent pontine subcentimeter white matter FLAIR T2 hyperintensities are less than expected for age. Old small LEFT basal ganglia lacunar infarct. No suspicious parenchymal signal, masses or mass effect. No abnormal extra-axial fluid collections. No abnormal intracranial enhancement. No extra-axial masses though, contrast enhanced sequences would be more sensitive. Normal major intracranial vascular flow voids present at skull base. ORBITS: The included ocular globes and orbital contents are non-suspicious. Status post bilateral ocular lens implants. SINUSES: Paranasal sinuses are well-aerated. Small bilateral mastoid effusions. SKULL/SOFT TISSUES: No abnormal sellar expansion. No suspicious calvarial bone marrow signal. 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 Craniocervical junction maintained. Small RIGHT occipital scalp lipoma. MRI NECK FINDINGS Source images and MIP image were reviewed. The common carotid arteries are widely patent bilaterally. The carotid bifurcation is patent bilaterally and there is no significant carotid stenosis. Both  vertebral arteries and patent to the basilar without significant vertebral stenosis. There is no evidence for atherosclerosis or flow limiting stenosis. IMPRESSION: MRI HEAD: No acute intracranial process. 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 Mild white matter changes compatible with chronic small vessel ischemic disease. MRA NECK: Negative. Electronically Signed   By: Elon Alas M.D.   On: 04/15/2016 01:17   Mr Jeri Cos [id number] Contrast  04/15/2016  CLINICAL DATA:  Difficulty focusing for 2 hours. History of subdural hematoma, hypertension. EXAM: MRI HEAD  WITHOUT AND WITH CONTRAST. MRA NECK WITHOUT AND WITH CONTRAST TECHNIQUE: Multiplanar, multiecho pulse sequences of the brain and surrounding structures were obtained without and with intravenous contrast. Multiplanar, multiecho pulse sequences of the neck and surrounding structures were obtained without and with intravenous contrast. CONTRAST:  56mL MULTIHANCE GADOBENATE DIMEGLUMINE 529 MG/ML IV SOLN COMPARISON:  CT HEAD April 22, 2016 at 1812 hours FINDINGS: MRI HEAD FINDINGS INTRACRANIAL CONTENTS: No reduced diffusion to suggest acute ischemia. No susceptibility artifact to suggest hemorrhage. The ventricles and sulci are normal for patient's age. Old small LEFT cerebellar infarct. Scattered supratentorial and to lesser extent pontine subcentimeter white matter FLAIR T2 hyperintensities are less than expected for age. Old small LEFT basal ganglia lacunar infarct. No suspicious parenchymal signal, masses or mass effect. No abnormal extra-axial fluid collections. No abnormal intracranial enhancement. No extra-axial masses though, contrast enhanced sequences would be more sensitive. Normal major intracranial vascular flow voids present at skull base. ORBITS: The included ocular globes and orbital contents are non-suspicious. Status post bilateral ocular lens implants. SINUSES: Paranasal sinuses are well-aerated. Small bilateral mastoid effusions. SKULL/SOFT TISSUES: No abnormal sellar expansion. No suspicious calvarial bone marrow signal. 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 Craniocervical junction maintained. Small RIGHT occipital scalp lipoma. MRI NECK FINDINGS Source images and MIP image were reviewed. The common carotid arteries are widely patent bilaterally. The carotid bifurcation is patent bilaterally and there is no significant carotid stenosis. Both vertebral arteries and patent to the basilar without significant vertebral stenosis. There is no evidence  for atherosclerosis or flow limiting stenosis. IMPRESSION: MRI HEAD: No acute intracranial process. 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 Mild white matter changes compatible with chronic small vessel ischemic disease. MRA NECK: Negative. Electronically Signed   By: Elon Alas M.D.   On: 04/15/2016 01:17    Assessment & Plan:   Problem List Items Addressed This Visit    PMR (polymyalgia rheumatica) (HCC)    Continue low dose predniosne,  5 mg alternating with 3 mg.        Essential hypertension    Not Well controlled with metoprolol alone and low dose  amlodipine 2.5 mg daily.  Losartan added.  Lab Results  Component Value Date   CREATININE 0.91 04/23/2016   Lab Results  Component Value Date   NA 136 04/23/2016   K 5.0 04/23/2016   CL 103 04/23/2016   CO2 27 04/23/2016             Relevant Medications   atorvastatin (LIPITOR) 20 MG tablet   Other Relevant Orders   Comprehensive metabolic panel (Completed)   TIA (transient ischemic attack)    Workup reviewed.  Patient is already on aspirin.  Statin added today.  Plavix discussed but not started due to Urology referral which is pending.       Relevant Medications   atorvastatin (LIPITOR) 20 MG tablet  Hospital discharge follow-up    Patient is stable post discharge and has no new issues or questions about discharge plans at the visit today for hospital follow up.  I have reviewed the records from the hospital admission in detail with patient today.      Cystitis    Noninfectious by repeat UA.  May be medication induced,  Since she is taking ASA and prednison.  Main symptoms is urinary hesitancy which may be due to prn use of benadryl.  Urology referral in process .  Continue asa and prednisone given her recent TIA and concurrent PMR       Other Visit Diagnoses    Hypothyroidism due to acquired atrophy of thyroid    -  Primary    Relevant Orders    TSH (Completed)     Hyperlipidemia        Relevant Medications    atorvastatin (LIPITOR) 20 MG tablet    Other Relevant Orders    LDL cholesterol, direct (Completed)       I have discontinued Ms. Bratton's amLODipine, cephALEXin, and phenazopyridine. I have also changed her predniSONE. Additionally, I am having her start on atorvastatin. Lastly, I am having her maintain her metoprolol tartrate, levothyroxine, aspirin EC, predniSONE, losartan, and tamsulosin.  Meds ordered this encounter  Medications  . predniSONE (DELTASONE) 1 MG tablet    Sig: Take 3 tablets (3 mg total) by mouth daily with breakfast. alternating with 5 mg    Dispense:  90 tablet    Refill:  1  . atorvastatin (LIPITOR) 20 MG tablet    Sig: Take 1 tablet (20 mg total) by mouth daily.    Dispense:  90 tablet    Refill:  3    Medications Discontinued During This Encounter  Medication Reason  . amLODipine (NORVASC) 2.5 MG tablet   . cephALEXin (KEFLEX) 500 MG capsule   . phenazopyridine (PYRIDIUM) 200 MG tablet   . predniSONE (DELTASONE) 1 MG tablet Reorder    Follow-up: No Follow-up on file.   Crecencio Mc, MD

## 2016-04-25 DIAGNOSIS — N3090 Cystitis, unspecified without hematuria: Secondary | ICD-10-CM | POA: Insufficient documentation

## 2016-04-25 DIAGNOSIS — Z09 Encounter for follow-up examination after completed treatment for conditions other than malignant neoplasm: Secondary | ICD-10-CM | POA: Insufficient documentation

## 2016-04-25 NOTE — Assessment & Plan Note (Signed)
Continue low dose predniosne,  5 mg alternating with 3 mg.

## 2016-04-25 NOTE — Assessment & Plan Note (Signed)
Not Well controlled with metoprolol alone and low dose  amlodipine 2.5 mg daily.  Losartan added.  Lab Results  Component Value Date   CREATININE 0.91 04/23/2016   Lab Results  Component Value Date   NA 136 04/23/2016   K 5.0 04/23/2016   CL 103 04/23/2016   CO2 27 04/23/2016

## 2016-04-25 NOTE — Assessment & Plan Note (Signed)
Workup reviewed.  Patient is already on aspirin.  Statin added today.  Plavix discussed but not started due to Urology referral which is pending.

## 2016-04-25 NOTE — Assessment & Plan Note (Signed)
Noninfectious by repeat UA.  May be medication induced,  Since she is taking ASA and prednison.  Main symptoms is urinary hesitancy which may be due to prn use of benadryl.  Urology referral in process .  Continue asa and prednisone given her recent TIA and concurrent PMR

## 2016-04-25 NOTE — Assessment & Plan Note (Signed)
Patient is stable post discharge and has no new issues or questions about discharge plans at the visit today for hospital follow up.  I have reviewed the records from the hospital admission in detail with patient today. 

## 2016-04-26 ENCOUNTER — Other Ambulatory Visit: Payer: Self-pay | Admitting: Internal Medicine

## 2016-04-26 ENCOUNTER — Encounter: Payer: Self-pay | Admitting: Internal Medicine

## 2016-04-26 DIAGNOSIS — Z79899 Other long term (current) drug therapy: Secondary | ICD-10-CM

## 2016-04-26 DIAGNOSIS — G459 Transient cerebral ischemic attack, unspecified: Secondary | ICD-10-CM

## 2016-04-26 DIAGNOSIS — E785 Hyperlipidemia, unspecified: Secondary | ICD-10-CM

## 2016-04-27 ENCOUNTER — Telehealth: Payer: Self-pay | Admitting: Internal Medicine

## 2016-04-27 NOTE — Telephone Encounter (Signed)
Scheduled pt on 06/10/16 at 9:30 for labs.

## 2016-04-27 NOTE — Telephone Encounter (Signed)
Please schedule patient fasting lab appointment for 6 weeks.

## 2016-05-01 ENCOUNTER — Ambulatory Visit (INDEPENDENT_AMBULATORY_CARE_PROVIDER_SITE_OTHER): Payer: Medicare Other | Admitting: Urology

## 2016-05-01 ENCOUNTER — Encounter: Payer: Self-pay | Admitting: Urology

## 2016-05-01 VITALS — BP 132/72 | HR 73 | Ht 62.5 in | Wt 144.0 lb

## 2016-05-01 DIAGNOSIS — N952 Postmenopausal atrophic vaginitis: Secondary | ICD-10-CM | POA: Diagnosis not present

## 2016-05-01 DIAGNOSIS — IMO0002 Reserved for concepts with insufficient information to code with codable children: Secondary | ICD-10-CM

## 2016-05-01 DIAGNOSIS — R3911 Hesitancy of micturition: Secondary | ICD-10-CM

## 2016-05-01 DIAGNOSIS — N8110 Cystocele, unspecified: Secondary | ICD-10-CM | POA: Diagnosis not present

## 2016-05-01 DIAGNOSIS — IMO0001 Reserved for inherently not codable concepts without codable children: Secondary | ICD-10-CM

## 2016-05-01 DIAGNOSIS — N362 Urethral caruncle: Secondary | ICD-10-CM

## 2016-05-01 DIAGNOSIS — N3000 Acute cystitis without hematuria: Secondary | ICD-10-CM

## 2016-05-01 LAB — URINALYSIS, COMPLETE
Bilirubin, UA: NEGATIVE
GLUCOSE, UA: NEGATIVE
KETONES UA: NEGATIVE
Leukocytes, UA: NEGATIVE
Nitrite, UA: NEGATIVE
PROTEIN UA: NEGATIVE
SPEC GRAV UA: 1.015 (ref 1.005–1.030)
Urobilinogen, Ur: 0.2 mg/dL (ref 0.2–1.0)
pH, UA: 6.5 (ref 5.0–7.5)

## 2016-05-01 LAB — MICROSCOPIC EXAMINATION
Bacteria, UA: NONE SEEN
WBC, UA: NONE SEEN /hpf (ref 0–?)

## 2016-05-01 LAB — BLADDER SCAN AMB NON-IMAGING: SCAN RESULT: 0

## 2016-05-01 NOTE — Progress Notes (Signed)
05/01/2016 12:43 PM   [name] [birth year] [id number]  Referring provider: Crecencio Mc, MD [place], [place] [address]  Chief Complaint  Patient presents with  . Cystitis    referred by Dr. Derrel Nip    HPI: Patient is an 82 year old WF who is referred to [place] by Dr. Derrel Nip for cystitis.  Patient states she was seen in the ED at [facility] for HTN and blurred vision.  During her evaluation, she was asked to give an urine specimen.  She was not given the supplies for a clean catch specimen.  UA at that time was positive for 6-30 squamous epithelial cells.  There were going to administer an antibiotic in the ED, but her daughter was against the antibiotic as the specimen was not a clean catch.  The antibiotic was not given.  Her urine culture was positive for multiple species.    She noticed that she was developing urinary hesitancy and was started on tamsulosin by her PCP.  She is voiding well now and would like to discontinue her tamsulosin.    Her last two UA did contain microscopic hematuria.  She does not endorse any gross hematuria.  Her catheterized UA today was negative. Her PVR was 0.  He is not experiencing gross hematuria, suprapubic pain or dysuria. She's not had recent fevers, chills, nausea or vomiting.    PMH: Past Medical History  Diagnosis Date  . Hypertension   . Arthritis   . Carpal tunnel syndrome   . Osteoporosis   . 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 Hypothyroidism   . Wears glasses   . Wears hearing aid   . Hx of TIA (transient ischemic attack) and stroke     Surgical History: Past Surgical History  Procedure Laterality Date  . Tonsillectomy    . Appendectomy    . Subdural hematoma evacuation via craniotomy  2007  . Colonoscopy    . Carpal tunnel release Left 07/11/2013    Procedure: CARPAL TUNNEL RELEASE;  Surgeon: Cammie Sickle., MD;  Location: [place];  Service: Orthopedics;   Laterality: Left;    Home Medications:    Medication List       This list is accurate as of: 05/01/16 12:43 PM.  Always use your most recent med list.               amLODipine 2.5 MG tablet  Commonly known as:  NORVASC  Reported on 05/01/2016     aspirin EC 81 MG tablet  Take 81 mg by mouth every evening.     atorvastatin 20 MG tablet  Commonly known as:  LIPITOR  Take 1 tablet (20 mg total) by mouth daily.     levothyroxine 100 MCG tablet  Commonly known as:  SYNTHROID, LEVOTHROID  Take 100 mcg by mouth daily before breakfast.     losartan 50 MG tablet  Commonly known as:  COZAAR  Take 1 tablet (50 mg total) by mouth daily.     metoprolol tartrate 25 MG tablet  Commonly known as:  LOPRESSOR  Take 2 tablets (50 mg total) by mouth 2 (two) times daily.     predniSONE 1 MG tablet  Commonly known as:  DELTASONE  Take 5 mg by mouth daily with breakfast. Reported on 05/01/2016     predniSONE 1 MG tablet  Commonly known as:  DELTASONE  Take 3 tablets (  3 mg total) by mouth daily with breakfast. alternating with 5 mg     tamsulosin 0.4 MG Caps capsule  Commonly known as:  FLOMAX  Take 1 capsule (0.4 mg total) by mouth daily.        Allergies: No Known Allergies  Family History: Family History  Problem Relation Age of Onset  . Cancer Mother 73    breast ca  . Cancer Maternal Aunt   . Cancer Brother     pancreatic ca  . Breast cancer Sister   . Brain cancer Brother     astrocytoma?  . Kidney cancer Sister     Social History:  reports that she has never smoked. She does not have any smokeless tobacco history on file. She reports that she drinks alcohol. She reports that she does not use illicit drugs.  ROS: UROLOGY Frequent Urination?: Yes Hard to postpone urination?: Yes Burning/pain with urination?: No Get up at night to urinate?: Yes Leakage of urine?: Yes Urine stream starts and stops?: Yes Trouble starting stream?: No Do you have to strain to  urinate?: No Blood in urine?: No Urinary tract infection?: No Sexually transmitted disease?: No Injury to kidneys or bladder?: No Painful intercourse?: No Weak stream?: No Currently pregnant?: No Vaginal bleeding?: No Last menstrual period?: 1981  Gastrointestinal Nausea?: No Vomiting?: No Indigestion/heartburn?: No Diarrhea?: No Constipation?: No  Constitutional Fever: No Night sweats?: No Weight loss?: No Fatigue?: No  Skin Skin rash/lesions?: No Itching?: No  Eyes Blurred vision?: Yes Double vision?: No  Ears/Nose/Throat Sore throat?: No Sinus problems?: No  Hematologic/Lymphatic Swollen glands?: No Easy bruising?: Yes  Cardiovascular Leg swelling?: No Chest pain?: No  Respiratory Cough?: No Shortness of breath?: No  Endocrine Excessive thirst?: No  Musculoskeletal Back pain?: No Joint pain?: No  Neurological Headaches?: No Dizziness?: No  Psychologic Depression?: No Anxiety?: No  Physical Exam: BP 132/72 mmHg  Pulse 73  Ht 5' 2.5" (1.588 m)  Wt 144 lb (65.318 kg)  BMI 25.90 kg/m2  Constitutional: Well nourished. Alert and oriented, No acute distress. HEENT: [place] AT, moist mucus membranes. Trachea midline, no masses. Cardiovascular: No clubbing, cyanosis, or edema. Respiratory: Normal respiratory effort, no increased work of breathing. GI: Abdomen is soft, non tender, non distended, no abdominal masses. Liver and spleen not palpable.  No hernias appreciated.  Stool sample for occult testing is not indicated.   GU: No CVA tenderness.  No bladder fullness or masses.   Normal external genitalia, normal pubic hair distribution, no lesions.  Atrophic urethral meatus, no lesions, no prolapse, no discharge.   Urethral caruncle is noted.  No bladder fullness, tenderness or masses. Normal vagina mucosa, good estrogen effect, no discharge, no lesions, good pelvic support, Grade II cystocele is noted.  No rectocele is noted.  No cervical motion  tenderness.  Uterus is freely mobile and non-fixed.  No adnexal/parametria masses or tenderness noted.  Anus and perineum are without rashes or lesions.    Skin: No rashes, bruises or suspicious lesions. Lymph: No cervical or inguinal adenopathy. Neurologic: Grossly intact, no focal deficits, moving all 4 extremities. Psychiatric: Normal mood and affect.  Laboratory Data: Lab Results  Component Value Date   WBC 11.1* 04/14/2016   HGB 13.8 04/14/2016   HCT 41.5 04/14/2016   MCV 90.7 04/14/2016   PLT 257 04/14/2016    Lab Results  Component Value Date   CREATININE 0.91 04/23/2016    Lab Results  Component Value Date   HGBA1C 5.8 09/13/2015  Lab Results  Component Value Date   TSH 1.93 04/23/2016       Component Value Date/Time   CHOL 216* 09/13/2015 0908   HDL 56.00 09/13/2015 0908   CHOLHDL 4 09/13/2015 0908   VLDL 29.6 09/13/2015 0908   LDLCALC 130* 09/13/2015 0908    Lab Results  Component Value Date   AST 17 04/23/2016   Lab Results  Component Value Date   ALT 15 04/23/2016      Urinalysis Results for orders placed or performed in visit on 05/01/16  Microscopic Examination  Result Value Ref Range   WBC, UA None seen 0 -  5 /hpf   RBC, UA 0-2 0 -  2 /hpf   Epithelial Cells (non renal) 0-10 0 - 10 /hpf   Bacteria, UA None seen None seen/Few  Urinalysis, Complete  Result Value Ref Range   Specific Gravity, UA 1.015 1.005 - 1.030   pH, UA 6.5 5.0 - 7.5   Color, UA Yellow Yellow   Appearance Ur Clear Clear   Leukocytes, UA Negative Negative   Protein, UA Negative Negative/Trace   Glucose, UA Negative Negative   Ketones, UA Negative Negative   RBC, UA Trace (A) Negative   Bilirubin, UA Negative Negative   Urobilinogen, Ur 0.2 0.2 - 1.0 mg/dL   Nitrite, UA Negative Negative   Microscopic Examination See below:   BLADDER SCAN AMB NON-IMAGING  Result Value Ref Range   Scan Result 0     Pertinent Imaging: Results for HARLOE, FRUEHAUF (MRN  [id number]) as of 05/01/2016 11:57  Ref. Range 05/01/2016 11:27  Scan Result Unknown 0   In and Out Catheterization  Patient is present today for a I & O catheterization due to contaminated UA's. Patient was cleaned and prepped in a sterile fashion with betadine and Lidocaine 2% jelly was instilled into the urethra.  A 14 FR cath was inserted no complications were noted , 40 ml of urine return was noted, urine was yellow  in color. A clean urine sample was collected for UA. Bladder was drained  And catheter was removed with out difficulty.    Preformed by: Zara Council, PA-C   Assessment & Plan:    1. Cystitis:   Patient was found to have pus and red blood cells and UA specimens that most likely were contaminated. Her catheterized specimen from today's exam was negative.  - BLADDER SCAN AMB NON-IMAGING  2. Vaginal atrophy:   I explained to the patient that when women go through menopause and her estrogen levels are severely diminished, the normal vaginal mucosa will change.  This can result in the drawing in of the vaginal introitus and urethral meatus.   Patient was given a sample of vaginal estrogen cream (Premarin/Estrace) and instructed to apply 0.5mg  (pea-sized amount)  just inside the vaginal introitus with a finger-tip every night for two weeks and then Monday, Wednesday and Friday nights.  I explained to the patient that vaginally administered estrogen, which causes only a slight increase in the blood estrogen levels, have fewer contraindications and adverse systemic effects that oral HT.  She will return in 2 weeks for symptom recheck, exam and report if she was available to require the vaginal cream by prescription.  3. Urethral caruncle:   See above.  4. Urinary hesitancy:   Patient will like to discontinue the tamsulosin at this time. Her urinary hesitancy they be the result of her cystocele and urethral carbuncle. She is initiated on vaginal estrogen  cream and we will reassess in 2  weeks.  5. Cystocele:   See above.    Return in about 2 weeks (around 05/15/2016) for exam.  These notes generated with voice recognition software. I apologize for typographical errors.  Zara Council, [facility] [address] [place], [place] [address] [phone]

## 2016-05-20 ENCOUNTER — Ambulatory Visit (INDEPENDENT_AMBULATORY_CARE_PROVIDER_SITE_OTHER): Payer: Medicare Other | Admitting: Urology

## 2016-05-20 ENCOUNTER — Encounter: Payer: Self-pay | Admitting: Urology

## 2016-05-20 VITALS — BP 148/73 | HR 80 | Ht 62.5 in | Wt 144.8 lb

## 2016-05-20 DIAGNOSIS — N952 Postmenopausal atrophic vaginitis: Secondary | ICD-10-CM | POA: Diagnosis not present

## 2016-05-20 DIAGNOSIS — N8110 Cystocele, unspecified: Secondary | ICD-10-CM

## 2016-05-20 DIAGNOSIS — R3911 Hesitancy of micturition: Secondary | ICD-10-CM

## 2016-05-20 DIAGNOSIS — IMO0001 Reserved for inherently not codable concepts without codable children: Secondary | ICD-10-CM

## 2016-05-20 DIAGNOSIS — IMO0002 Reserved for concepts with insufficient information to code with codable children: Secondary | ICD-10-CM

## 2016-05-20 DIAGNOSIS — N362 Urethral caruncle: Secondary | ICD-10-CM

## 2016-05-20 LAB — BLADDER SCAN AMB NON-IMAGING: Scan Result: 0

## 2016-05-20 NOTE — Progress Notes (Signed)
05/20/2016 3:45 PM   [name] Mainland 1927-04-30 [id number]  Referring provider: Crecencio Mc, MD [place],  [address]  Chief Complaint  Patient presents with  . Vaginal Atrophy    2 week follow up     HPI: Patient is an 82 year old WF who presents today for a two week follow up after starting vaginal estrogen cream.  Background history Patient was referred to [place] by Dr. Derrel Nip for cystitis.  Patient states she was seen in the ED at [facility] for HTN and blurred vision.  During her evaluation, she was asked to give an urine specimen.  She was not given the supplies for a clean catch specimen.  UA at that time was positive for 6-30 squamous epithelial cells.  There were going to administer an antibiotic in the ED, but her daughter was against the antibiotic as the specimen was not a clean catch.  The antibiotic was not given.  Her urine culture was positive for multiple species.  She noticed that she was developing urinary hesitancy and was started on tamsulosin by her PCP.  She is voiding well now and would like to discontinue her tamsulosin.  Her last two UA did contain microscopic hematuria.  She does not endorse any gross hematuria.  Her catheterized UA  was negative. Her PVR was 0.  She has been using the cream as directed.  She has not experienced vaginal irritation or rash.    She discontinued the tamsulosin after our first visit with [place] and feels she may be having difficulty emptying her bladder.  She is having frequency, urgency, nocturia and incontinence.  She is not having gross hematuria.  She is not having fevers, chills, nausea or vomiting.  Her PVR today is 0 mL.    She states that her stream is weak and it takes a long time to empty her bladder.  This can be frustrating to her at times.  It is not burning or causing any type of discomfort.        PMH: Past Medical History:  Diagnosis Date  . Arthritis   . Carpal tunnel syndrome   . 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 Hx of TIA (transient ischemic attack) and stroke   . Hypertension   . Hypothyroidism   . Osteoporosis   . Wears glasses   . Wears hearing aid     Surgical History: Past Surgical History:  Procedure Laterality Date  . APPENDECTOMY    . CARPAL TUNNEL RELEASE Left 07/11/2013   Procedure: CARPAL TUNNEL RELEASE;  Surgeon: Cammie Sickle., MD;  Location: [facility];  Service: Orthopedics;  Laterality: Left;  . COLONOSCOPY    . SUBDURAL HEMATOMA EVACUATION VIA CRANIOTOMY  2007  . TONSILLECTOMY      Home Medications:    Medication List       Accurate as of 05/20/16  3:45 PM. Always use your most recent med list.          amLODipine 2.5 MG tablet Commonly known as:  NORVASC Reported on 05/01/2016   aspirin EC 81 MG tablet Take 81 mg by mouth every evening.   atorvastatin 20 MG tablet Commonly known as:  LIPITOR Take 1 tablet (20 mg total) by mouth daily.   conjugated estrogens vaginal cream Commonly known as:  PREMARIN Place 1 Applicatorful vaginally daily.   levothyroxine 100 MCG tablet Commonly known as:  SYNTHROID, LEVOTHROID Take 100 mcg by mouth daily before breakfast.   losartan 50 MG tablet Commonly known as:  COZAAR Take 1 tablet (50 mg total) by mouth daily.   metoprolol tartrate 25 MG tablet Commonly known as:  LOPRESSOR Take 2 tablets (50 mg total) by mouth 2 (two) times daily.   predniSONE 1 MG tablet Commonly known as:  DELTASONE Take 5 mg by mouth daily with breakfast. Reported on 05/01/2016   predniSONE 1 MG tablet Commonly known as:  DELTASONE Take 3 tablets (3 mg total) by mouth daily with breakfast. alternating with 5 mg   tamsulosin 0.4 MG Caps capsule Commonly known as:  FLOMAX Take 1 capsule (0.4 mg total) by mouth daily.   Vitamin D (Ergocalciferol) 50000 units Caps capsule Commonly known as:  DRISDOL Take by mouth.       Allergies: No Known Allergies  Family  History: Family History  Problem Relation Age of Onset  . Cancer Mother 40    breast ca  . Cancer Maternal Aunt   . Cancer Brother     pancreatic ca  . Breast cancer Sister   . Brain cancer Brother     astrocytoma?  . Kidney cancer Sister     Social History:  reports that she has never smoked. She does not have any smokeless tobacco history on file. She reports that she drinks alcohol. She reports that she does not use drugs.  ROS: UROLOGY Frequent Urination?: Yes Hard to postpone urination?: Yes Burning/pain with urination?: No Get up at night to urinate?: Yes Leakage of urine?: Yes Urine stream starts and stops?: No Trouble starting stream?: No Do you have to strain to urinate?: No Blood in urine?: No Urinary tract infection?: No Sexually transmitted disease?: No Injury to kidneys or bladder?: No Painful intercourse?: No Weak stream?: No Currently pregnant?: No Vaginal bleeding?: No Last menstrual period?: nm  Gastrointestinal Nausea?: No Vomiting?: No Indigestion/heartburn?: No Diarrhea?: No Constipation?: No  Constitutional Fever: No Night sweats?: No Weight loss?: No Fatigue?: No  Skin Skin rash/lesions?: No Itching?: No  Eyes Blurred vision?: No Double vision?: No  Ears/Nose/Throat Sore throat?: No Sinus problems?: No  Hematologic/Lymphatic Swollen glands?: No Easy bruising?: No  Cardiovascular Leg swelling?: No Chest pain?: No  Respiratory Cough?: No Shortness of breath?: No  Endocrine Excessive thirst?: No  Musculoskeletal Back pain?: No Joint pain?: No  Neurological Headaches?: No Dizziness?: No  Psychologic Depression?: No Anxiety?: No  Physical Exam: BP (!) 148/73   Pulse 80   Ht 5' 2.5" (1.588 m)   Wt 144 lb 12.8 oz (65.7 kg)   BMI 26.06 kg/m   Constitutional: Well nourished. Alert and oriented, No acute distress. HEENT: [place] AT, moist mucus membranes. Trachea midline, no masses. Cardiovascular: No clubbing,  cyanosis, or edema. Respiratory: Normal respiratory effort, no increased work of breathing. GI: Abdomen is soft, non tender, non distended, no abdominal masses. Liver and spleen not palpable.  No hernias appreciated.  Stool sample for occult testing is not indicated.   GU: No CVA tenderness.  No bladder fullness or masses.   Normal external genitalia, normal pubic hair distribution, no lesions.  Atrophic urethral meatus, no lesions, no prolapse, no discharge.   Urethral caruncle is noted.  No bladder fullness, tenderness or masses. Normal vagina mucosa, good estrogen effect, no discharge, no lesions, good pelvic support, Grade II cystocele is noted.  No rectocele is noted.  No cervical motion tenderness.  Uterus is freely mobile and non-fixed.  No  adnexal/parametria masses or tenderness noted.  Anus and perineum are without rashes or lesions.    Skin: No rashes, bruises or suspicious lesions. Lymph: No cervical or inguinal adenopathy. Neurologic: Grossly intact, no focal deficits, moving all 4 extremities. Psychiatric: Normal mood and affect.  Laboratory Data: Lab Results  Component Value Date   WBC 11.1 (H) 04/14/2016   HGB 13.8 04/14/2016   HCT 41.5 04/14/2016   MCV 90.7 04/14/2016   PLT 257 04/14/2016    Lab Results  Component Value Date   CREATININE 0.91 04/23/2016    Lab Results  Component Value Date   HGBA1C 5.8 09/13/2015    Lab Results  Component Value Date   TSH 1.93 04/23/2016       Component Value Date/Time   CHOL 216 (H) 09/13/2015 0908   HDL 56.00 09/13/2015 0908   CHOLHDL 4 09/13/2015 0908   VLDL 29.6 09/13/2015 0908   LDLCALC 130 (H) 09/13/2015 0908    Lab Results  Component Value Date   AST 17 04/23/2016   Lab Results  Component Value Date   ALT 15 04/23/2016      Results for orders placed or performed in visit on 05/20/16  BLADDER SCAN AMB NON-IMAGING  Result Value Ref Range   Scan Result 0      Assessment & Plan:    1. Vaginal atrophy:    Patient has been applying the cream as directed.  She will continue the cream three nights weekly.  We will repeat exam in three months.    2. Urethral caruncle:   See above.  3. Urinary hesitancy:   Patient is emptying her bladder.  She will continue the cream three night weekly.  I did explain to her that her bladder muscles are likely experiencing atony and that is why she is having a weak stream.   - BLADDER SCAN AMB NON-IMAGING  4. Cystocele:   See above.    Return in about 3 months (around 08/20/2016) for exam.  These notes generated with voice recognition software. I apologize for typographical errors.  Zara Council, [facility] [address], [place], [place] [address] [phone]

## 2016-06-01 ENCOUNTER — Other Ambulatory Visit: Payer: Self-pay | Admitting: Internal Medicine

## 2016-06-04 ENCOUNTER — Other Ambulatory Visit: Payer: Medicare Other

## 2016-06-10 ENCOUNTER — Other Ambulatory Visit (INDEPENDENT_AMBULATORY_CARE_PROVIDER_SITE_OTHER): Payer: Medicare Other

## 2016-06-10 DIAGNOSIS — Z79899 Other long term (current) drug therapy: Secondary | ICD-10-CM

## 2016-06-10 DIAGNOSIS — G459 Transient cerebral ischemic attack, unspecified: Secondary | ICD-10-CM | POA: Diagnosis not present

## 2016-06-10 DIAGNOSIS — E785 Hyperlipidemia, unspecified: Secondary | ICD-10-CM | POA: Diagnosis not present

## 2016-06-10 LAB — COMPREHENSIVE METABOLIC PANEL
ALT: 16 U/L (ref 0–35)
AST: 19 U/L (ref 0–37)
Albumin: 4.1 g/dL (ref 3.5–5.2)
Alkaline Phosphatase: 57 U/L (ref 39–117)
BUN: 16 mg/dL (ref 6–23)
CHLORIDE: 104 meq/L (ref 96–112)
CO2: 31 meq/L (ref 19–32)
Calcium: 9.3 mg/dL (ref 8.4–10.5)
Creatinine, Ser: 0.86 mg/dL (ref 0.40–1.20)
GFR: 66.31 mL/min (ref >60.00)
GLUCOSE: 100 mg/dL — AB (ref 70–99)
Potassium: 4.8 mEq/L (ref 3.5–5.1)
SODIUM: 139 meq/L (ref 135–145)
TOTAL PROTEIN: 6.9 g/dL (ref 6.0–8.3)
Total Bilirubin: 0.6 mg/dL (ref 0.2–1.2)

## 2016-06-10 LAB — LIPID PANEL
CHOL/HDL RATIO: 3
Cholesterol: 152 mg/dL (ref 0–200)
HDL: 55.1 mg/dL (ref >39.00)
LDL CALC: 71 mg/dL (ref 0–99)
NONHDL: 96.82
Triglycerides: 131 mg/dL (ref 0.0–149.0)
VLDL: 26.2 mg/dL (ref 0.0–40.0)

## 2016-06-10 LAB — LDL CHOLESTEROL, DIRECT: LDL DIRECT: 78 mg/dL

## 2016-06-12 ENCOUNTER — Encounter: Payer: Self-pay | Admitting: Internal Medicine

## 2016-06-15 ENCOUNTER — Other Ambulatory Visit: Payer: Self-pay | Admitting: Internal Medicine

## 2016-06-23 ENCOUNTER — Other Ambulatory Visit: Payer: Self-pay

## 2016-07-01 ENCOUNTER — Other Ambulatory Visit: Payer: Self-pay | Admitting: Internal Medicine

## 2016-07-01 NOTE — Telephone Encounter (Signed)
I do not see that we sent this rx in for the patient as it states historical provider, ok to fill?

## 2016-07-02 NOTE — Telephone Encounter (Signed)
refilled 

## 2016-07-14 DIAGNOSIS — M170 Bilateral primary osteoarthritis of knee: Secondary | ICD-10-CM | POA: Diagnosis not present

## 2016-07-27 DIAGNOSIS — Z23 Encounter for immunization: Secondary | ICD-10-CM | POA: Diagnosis not present

## 2016-07-31 ENCOUNTER — Other Ambulatory Visit: Payer: Self-pay | Admitting: Internal Medicine

## 2016-08-20 ENCOUNTER — Ambulatory Visit (INDEPENDENT_AMBULATORY_CARE_PROVIDER_SITE_OTHER): Payer: Medicare Other | Admitting: Urology

## 2016-08-20 ENCOUNTER — Encounter: Payer: Self-pay | Admitting: Urology

## 2016-08-20 VITALS — BP 123/68 | HR 70 | Ht 62.5 in | Wt 146.3 lb

## 2016-08-20 DIAGNOSIS — R3911 Hesitancy of micturition: Secondary | ICD-10-CM | POA: Diagnosis not present

## 2016-08-20 DIAGNOSIS — N8110 Cystocele, unspecified: Secondary | ICD-10-CM

## 2016-08-20 DIAGNOSIS — N362 Urethral caruncle: Secondary | ICD-10-CM

## 2016-08-20 DIAGNOSIS — N952 Postmenopausal atrophic vaginitis: Secondary | ICD-10-CM

## 2016-08-20 MED ORDER — ESTROGENS, CONJUGATED 0.625 MG/GM VA CREA: 1.0000 | TOPICAL_CREAM | Freq: Every day | VAGINAL | 12 refills | Status: DC

## 2016-08-20 MED ORDER — ESTRADIOL 0.1 MG/GM VA CREA: TOPICAL_CREAM | VAGINAL | 12 refills | Status: DC

## 2016-08-20 NOTE — Progress Notes (Signed)
08/20/2016 3:29 PM   [name] Mainland February 06, 1927 [id number]  Referring provider: Crecencio Mc, MD [place], [place] [address]  Chief Complaint  Patient presents with  . Follow-up    VAGINAL ATROPHY     HPI: Patient is an 82 year old Caucasian female who presents today for a three months follow up after starting vaginal estrogen cream.  Patient has been using the vaginal estrogen cream 3 nights weekly.  She has not had any urinary tract infections since her last visit with [place] 3 months ago.    She is still experiencing intermittency, incontinence and nocturia.  She does not find these urinary symptoms bothersome to her at this time.  She is not had gross hematuria, suprapubic pain or dysuria. She also denies any fevers, chills, nausea or vomiting.  She is not experiencing vaginal irritation or itching.   PMH: Past Medical History:  Diagnosis Date  . Arthritis   . Carpal tunnel syndrome   . Gait abnormality    has for 90yr-no use of cane  . Hx of TIA (transient ischemic attack) and stroke   . Hypertension   . Hypothyroidism   . Osteoporosis   . Wears glasses   . Wears hearing aid     Surgical History: Past Surgical History:  Procedure Laterality Date  . APPENDECTOMY    . CARPAL TUNNEL RELEASE Left 07/11/2013   Procedure: CARPAL TUNNEL RELEASE;  Surgeon: Cammie Sickle., MD;  Location: [facility];  Service: Orthopedics;  Laterality: Left;  . COLONOSCOPY    . SUBDURAL HEMATOMA EVACUATION VIA CRANIOTOMY  2007  . TONSILLECTOMY      Home Medications:    Medication List       Accurate as of 08/20/16  3:29 PM. Always use your most recent med list.          amLODipine 2.5 MG tablet Commonly known as:  NORVASC Reported on 05/01/2016   aspirin EC 81 MG tablet Take 81 mg by mouth every evening.   atorvastatin 20 MG tablet Commonly known as:  LIPITOR Take 1 tablet (20 mg total) by mouth daily.   conjugated estrogens  vaginal cream Commonly known as:  PREMARIN Place 1 Applicatorful vaginally daily.   conjugated estrogens vaginal cream Commonly known as:  PREMARIN Place 1 Applicatorful vaginally daily. Apply 0.5mg  (pea-sized amount)  just inside the vaginal introitus with a finger-tip every night for two weeks and then Monday, Wednesday and Friday nights.   estradiol 0.1 MG/GM vaginal cream Commonly known as:  ESTRACE VAGINAL Apply 0.5mg  (pea-sized amount)  just inside the vaginal introitus with a finger-tip every night for two weeks and then Monday, Wednesday and Friday nights.   levothyroxine 100 MCG tablet Commonly known as:  SYNTHROID, LEVOTHROID TAKE 1 TABLET BY MOUTH ONCE DAILY ON AN EMPTY STOMACH. WAIT 30 MINUTES BEFORE TAKING OTHER MEDS.   losartan 50 MG tablet Commonly known as:  COZAAR Take 1 tablet (50 mg total) by mouth daily.   losartan 50 MG tablet Commonly known as:  COZAAR TAKE 1 TABLET BY MOUTH ONCE DAILY   metoprolol tartrate 25 MG tablet Commonly known as:  LOPRESSOR TAKE 2 TABLETS BY MOUTH TWICE DAILY       Allergies: No Known Allergies  Family History: Family History  Problem Relation Age of Onset  . Cancer Mother 70    breast ca  . Cancer Maternal Aunt   . Cancer Brother  pancreatic ca  . Breast cancer Sister   . Brain cancer Brother     astrocytoma?  . Kidney cancer Sister     Social History:  reports that she has never smoked. She does not have any smokeless tobacco history on file. She reports that she drinks alcohol. She reports that she does not use drugs.  ROS: UROLOGY Frequent Urination?: No Hard to postpone urination?: No Burning/pain with urination?: No Get up at night to urinate?: Yes Leakage of urine?: Yes Urine stream starts and stops?: Yes Trouble starting stream?: No Do you have to strain to urinate?: No Blood in urine?: No Urinary tract infection?: No Sexually transmitted disease?: No Injury to kidneys or bladder?: No Painful  intercourse?: No Weak stream?: No Currently pregnant?: No Vaginal bleeding?: No Last menstrual period?: n  Gastrointestinal Nausea?: No Vomiting?: No Indigestion/heartburn?: No Diarrhea?: No Constipation?: No  Constitutional Fever: No Night sweats?: No Weight loss?: No Fatigue?: No  Skin Skin rash/lesions?: No Itching?: No  Eyes Blurred vision?: No Double vision?: No  Ears/Nose/Throat Sore throat?: No Sinus problems?: No  Hematologic/Lymphatic Swollen glands?: No Easy bruising?: No  Cardiovascular Leg swelling?: No Chest pain?: No  Respiratory Cough?: No Shortness of breath?: Yes  Endocrine Excessive thirst?: No  Musculoskeletal Back pain?: No Joint pain?: No  Neurological Headaches?: No Dizziness?: No  Psychologic Depression?: No Anxiety?: No  Physical Exam: BP 123/68   Pulse 70   Ht 5' 2.5" (1.588 m)   Wt 146 lb 4.8 oz (66.4 kg)   BMI 26.33 kg/m   Constitutional: Well nourished. Alert and oriented, No acute distress. HEENT: [place] AT, moist mucus membranes. Trachea midline, no masses. Cardiovascular: No clubbing, cyanosis, or edema. Respiratory: Normal respiratory effort, no increased work of breathing. GI: Abdomen is soft, non tender, non distended, no abdominal masses. Liver and spleen not palpable.  No hernias appreciated.  Stool sample for occult testing is not indicated.   GU: No CVA tenderness.  No bladder fullness or masses.   Normal external genitalia, normal pubic hair distribution, no lesions.  Atrophic urethral meatus, no lesions, no prolapse, no discharge.   Urethral caruncle is noted.  No bladder fullness, tenderness or masses. Normal vagina mucosa, good estrogen effect, no discharge, no lesions, good pelvic support, Grade II cystocele is noted.  No rectocele is noted.  No cervical motion tenderness.  Uterus is freely mobile and non-fixed.  No adnexal/parametria masses or tenderness noted.  Anus and perineum are without rashes or  lesions.    Skin: No rashes, bruises or suspicious lesions. Lymph: No cervical or inguinal adenopathy. Neurologic: Grossly intact, no focal deficits, moving all 4 extremities. Psychiatric: Normal mood and affect.  Laboratory Data: Lab Results  Component Value Date   WBC 11.1 (H) 04/14/2016   HGB 13.8 04/14/2016   HCT 41.5 04/14/2016   MCV 90.7 04/14/2016   PLT 257 04/14/2016    Lab Results  Component Value Date   CREATININE 0.86 06/10/2016    Lab Results  Component Value Date   HGBA1C 5.8 09/13/2015    Lab Results  Component Value Date   TSH 1.93 04/23/2016       Component Value Date/Time   CHOL 152 06/10/2016 0815   HDL 55.10 06/10/2016 0815   CHOLHDL 3 06/10/2016 0815   VLDL 26.2 06/10/2016 0815   LDLCALC 71 06/10/2016 0815    Lab Results  Component Value Date   AST 19 06/10/2016   Lab Results  Component Value Date   ALT  16 06/10/2016      Results for orders placed or performed in visit on 06/10/16  Comprehensive metabolic panel  Result Value Ref Range   Sodium 139 135 - 145 mEq/L   Potassium 4.8 3.5 - 5.1 mEq/L   Chloride 104 96 - 112 mEq/L   CO2 31 19 - 32 mEq/L   Glucose, Bld 100 (H) 70 - 99 mg/dL   BUN 16 6 - 23 mg/dL   Creatinine, Ser 0.86 0.40 - 1.20 mg/dL   Total Bilirubin 0.6 0.2 - 1.2 mg/dL   Alkaline Phosphatase 57 39 - 117 U/L   AST 19 0 - 37 U/L   ALT 16 0 - 35 U/L   Total Protein 6.9 6.0 - 8.3 g/dL   Albumin 4.1 3.5 - 5.2 g/dL   Calcium 9.3 8.4 - 10.5 mg/dL   GFR 66.31 >60.00 mL/min  Lipid panel  Result Value Ref Range   Cholesterol 152 0 - 200 mg/dL   Triglycerides 131.0 0.0 - 149.0 mg/dL   HDL 55.10 >39.00 mg/dL   VLDL 26.2 0.0 - 40.0 mg/dL   LDL Cholesterol 71 0 - 99 mg/dL   Total CHOL/HDL Ratio 3    NonHDL 96.82   LDL cholesterol, direct  Result Value Ref Range   Direct LDL 78.0 mg/dL     Assessment & Plan:    1. Vaginal atrophy:   Patient has been applying the cream as directed.  She will continue the cream  three nights weekly.  We will repeat exam in one year.  Refills are given.    2. Urethral caruncle:   See above.  3. Urinary hesitancy:   Not bothersome to the patient at this time.  4. Cystocele:   See above.    Return in about 1 year (around 08/20/2017) for exam.  These notes generated with voice recognition software. I apologize for typographical errors.  Zara Council, [facility] [address] [place], [place] [address] [phone]

## 2016-08-28 DIAGNOSIS — R262 Difficulty in walking, not elsewhere classified: Secondary | ICD-10-CM | POA: Diagnosis not present

## 2016-08-28 DIAGNOSIS — M6281 Muscle weakness (generalized): Secondary | ICD-10-CM | POA: Diagnosis not present

## 2016-08-28 DIAGNOSIS — M25562 Pain in left knee: Secondary | ICD-10-CM | POA: Diagnosis not present

## 2016-09-03 DIAGNOSIS — M25562 Pain in left knee: Secondary | ICD-10-CM | POA: Diagnosis not present

## 2016-09-03 DIAGNOSIS — R262 Difficulty in walking, not elsewhere classified: Secondary | ICD-10-CM | POA: Diagnosis not present

## 2016-09-03 DIAGNOSIS — M6281 Muscle weakness (generalized): Secondary | ICD-10-CM | POA: Diagnosis not present

## 2016-09-08 DIAGNOSIS — M6281 Muscle weakness (generalized): Secondary | ICD-10-CM | POA: Diagnosis not present

## 2016-09-08 DIAGNOSIS — M25562 Pain in left knee: Secondary | ICD-10-CM | POA: Diagnosis not present

## 2016-09-08 DIAGNOSIS — R262 Difficulty in walking, not elsewhere classified: Secondary | ICD-10-CM | POA: Diagnosis not present

## 2016-09-10 ENCOUNTER — Ambulatory Visit: Payer: Medicare Other

## 2016-09-15 DIAGNOSIS — M25562 Pain in left knee: Secondary | ICD-10-CM | POA: Diagnosis not present

## 2016-09-15 DIAGNOSIS — M6281 Muscle weakness (generalized): Secondary | ICD-10-CM | POA: Diagnosis not present

## 2016-09-15 DIAGNOSIS — R262 Difficulty in walking, not elsewhere classified: Secondary | ICD-10-CM | POA: Diagnosis not present

## 2016-09-22 ENCOUNTER — Ambulatory Visit (INDEPENDENT_AMBULATORY_CARE_PROVIDER_SITE_OTHER): Payer: Medicare Other

## 2016-09-22 VITALS — BP 132/70 | HR 72 | Temp 97.7°F | Resp 12 | Ht 62.5 in | Wt 144.4 lb

## 2016-09-22 DIAGNOSIS — Z0000 Encounter for general adult medical examination without abnormal findings: Secondary | ICD-10-CM | POA: Diagnosis not present

## 2016-09-22 NOTE — Progress Notes (Signed)
Subjective:   [name] is a 82 y.o. female who presents for an Initial Medicare Annual Wellness Visit.  Review of Systems    No ROS.  Medicare Wellness Visit.  Cardiac Risk Factors include: advanced age (>64men, >30 women);hypertension     Objective:    Today's Vitals   09/22/16 0813  BP: 132/70  Pulse: 72  Resp: 12  Temp: 97.7 F (36.5 C)  TempSrc: Oral  SpO2: 96%  Weight: 144 lb 6.4 oz (65.5 kg)  Height: 5' 2.5" (1.588 m)   Body mass index is 25.99 kg/m.   Current Medications (verified) Outpatient Encounter Prescriptions as of 09/22/2016  Medication Sig  . amLODipine (NORVASC) 2.5 MG tablet Reported on 05/01/2016  . aspirin EC 81 MG tablet Take 81 mg by mouth every evening.  [name] atorvastatin (LIPITOR) 20 MG tablet Take 1 tablet (20 mg total) by mouth daily.  [name] conjugated estrogens (PREMARIN) vaginal cream Place 1 Applicatorful vaginally daily.  [name] conjugated estrogens (PREMARIN) vaginal cream Place 1 Applicatorful vaginally daily. Apply 0.5mg  (pea-sized amount)  just inside the vaginal introitus with a finger-tip every night for two weeks and then Monday, Wednesday and Friday nights.  . estradiol (ESTRACE VAGINAL) 0.1 MG/GM vaginal cream Apply 0.5mg  (pea-sized amount)  just inside the vaginal introitus with a finger-tip every night for two weeks and then Monday, Wednesday and Friday nights.  . levothyroxine (SYNTHROID, LEVOTHROID) 100 MCG tablet TAKE 1 TABLET BY MOUTH ONCE DAILY ON AN EMPTY STOMACH. WAIT 30 MINUTES BEFORE TAKING OTHER MEDS.  [name] losartan (COZAAR) 50 MG tablet Take 1 tablet (50 mg total) by mouth daily.  [name] losartan (COZAAR) 50 MG tablet TAKE 1 TABLET BY MOUTH ONCE DAILY  . metoprolol tartrate (LOPRESSOR) 25 MG tablet TAKE 2 TABLETS BY MOUTH TWICE DAILY   No facility-administered encounter medications on file as of 09/22/2016.     Allergies (verified) Patient has no known allergies.   History: Past Medical History:  Diagnosis Date  . Arthritis     . Carpal tunnel syndrome   . 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 Hx of TIA (transient ischemic attack) and stroke   . Hypertension   . Hypothyroidism   . Osteoporosis   . Wears glasses   . Wears hearing aid    Past Surgical History:  Procedure Laterality Date  . APPENDECTOMY    . CARPAL TUNNEL RELEASE Left 07/11/2013   Procedure: CARPAL TUNNEL RELEASE;  Surgeon: Cammie Sickle., MD;  Location: [facility];  Service: Orthopedics;  Laterality: Left;  . COLONOSCOPY    . SUBDURAL HEMATOMA EVACUATION VIA CRANIOTOMY  2007  . TONSILLECTOMY     Family History  Problem Relation Age of Onset  . Cancer Mother 66    breast ca  . Cancer Maternal Aunt   . Cancer Brother     pancreatic ca  . Breast cancer Sister   . Brain cancer Brother     astrocytoma?  . Kidney cancer Sister    Social History   Occupational History  . Not on file.   Social History Main Topics  . Smoking status: Never Smoker  . Smokeless tobacco: Never Used  . Alcohol use Yes     Comment: occ  . Drug use: No  . Sexual activity: Not on file    Tobacco Counseling Counseling given: Not Answered   Activities of Daily Living In your present state of health, do you have  any difficulty performing the following activities: 09/22/2016 04/15/2016  Hearing? [name]  Vision? N N  Difficulty concentrating or making decisions? [name]  Walking or climbing stairs? Y N  Dressing or bathing? N N  Doing errands, shopping? N N  Preparing Food and eating ? N -  Using the Toilet? N -  In the past six months, have you accidently leaked urine? N -  Do you have problems with loss of bowel control? N -  Managing your Medications? N -  Managing your Finances? N -  Housekeeping or managing your Housekeeping? N -  Some recent data might be hidden    Immunizations and Health Maintenance Immunization History  Administered Date(s) Administered  . Influenza, High Dose Seasonal PF 07/25/2015  .  Influenza-Unspecified 08/07/2014, 07/27/2016  . Pneumococcal Conjugate-13 09/07/2014  . Pneumococcal Polysaccharide-23 09/07/2005, 09/07/2010  . Tdap 11/29/2015  . Zoster 01/20/2005   There are no preventive care reminders to display for this patient.  Patient Care Team: Crecencio Mc, MD as PCP - General (Internal Medicine)  Indicate any recent Medical Services you may have received from other than [facility] providers in the past year (date may be approximate).     Assessment:   This is a routine wellness examination for [name]. The goal of the wellness visit is to assist the patient how to close the gaps in care and create a preventative care plan for the patient.   Taking calcium VIT D as appropriate/Osteoporosis risk reviewed.  Medications reviewed; taking without issues or barriers.  Safety issues reviewed; lives alone.  Alarm system with smoke detectors in the home. No firearms in the home. Wears seatbelts when driving or riding with others. No violence in the home.  No identified risk were noted; The patient was oriented x 3; appropriate in dress and manner and no objective failures at ADL's or IADL's.   BMI; discussed the importance of a healthy diet, water intake and exercise. Educational material provided.  Health maintenance gaps; closed.  Patient Concerns: 6 month follow up scheduled with PCP.  Hearing/Vision screen Hearing Screening Comments: Followed by [facility] (Dr. Tami Ribas) Wears hearing aids, bilateral Vision Screening Comments: Bilateral cataracts extracted Wears glasses  Last OV 2015 Vision screening deferred per patient request  Dietary issues and exercise activities discussed: Current Exercise Habits: Structured exercise class (Physical therapy exercises.  ), Time (Minutes): 60, Frequency (Times/Week): 1, Weekly Exercise (Minutes/Week): 60, Intensity: Mild  Goals    . Healthy Lifestyle          Stay hydrated and drink plenty of fluids  Stay  active and continue physical therapy for exercise  Low carb foods      Depression Screen PHQ 2/9 Scores 09/22/2016  PHQ - 2 Score 0    Fall Risk Fall Risk  09/22/2016  Falls in the past year? No    Cognitive Function:     6CIT Screen 09/22/2016  What Year? 0 points  What month? 0 points  What time? 0 points  Count back from 20 0 points  Months in reverse 0 points  Repeat phrase 0 points  Total Score 0    Screening Tests Health Maintenance  Topic Date Due  . TETANUS/TDAP  11/28/2025  . INFLUENZA VACCINE  Completed  . DEXA SCAN  Completed  . ZOSTAVAX  Completed  . PNA vac Low Risk Adult  Completed      Plan:    End of life planning; Advance aging; Advanced directives discussed. Copy  of current HCPOA/Living Will requested.  Medicare Attestation I have personally reviewed: The patient's medical and social history Their use of alcohol, tobacco or illicit drugs Their current medications and supplements The patient's functional ability including ADLs,fall risks, home safety risks, cognitive, and hearing and visual impairment Diet and physical activities Evidence for depression   The patient's weight, height, BMI, and visual acuity have been recorded in the chart.  I have made referrals and provided education to the patient based on review of the above and I have provided the patient with a written personalized care plan for preventive services.    During the course of the visit, [name] was educated and counseled about the following appropriate screening and preventive services:   Vaccines to include Pneumoccal, Influenza, Hepatitis B, Td, Zostavax, HCV  Electrocardiogram  Cardiovascular disease screening  Colorectal cancer screening  Bone density screening  Diabetes screening  Glaucoma screening  Mammography/PAP  Nutrition counseling  Smoking cessation counseling  Patient Instructions (the written plan) were given to the patient.     Varney Biles, LPN   [id number]

## 2016-09-22 NOTE — Patient Instructions (Addendum)
[name] , Thank you for taking time to come for your Medicare Wellness Visit. I appreciate your ongoing commitment to your health goals. Please review the following plan we discussed and let me know if I can assist you in the future.   FOLLOW UP NEEDED WITH DR. Derrel Nip  These are the goals we discussed: Goals    . Healthy Lifestyle          Stay hydrated and drink plenty of fluids  Stay active and continue physical therapy for exercise  Low carb foods       This is a list of the screening recommended for you and due dates:  Health Maintenance  Topic Date Due  . Tetanus Vaccine  11/28/2025  . Flu Shot  Completed  . DEXA scan (bone density measurement)  Completed  . Shingles Vaccine  Completed  . Pneumonia vaccines  Completed    Fall Prevention in the Home Introduction Falls can cause injuries. They can happen to people of all ages. There are many things you can do to make your home safe and to help prevent falls. What can I do on the outside of my home?  Regularly fix the edges of walkways and driveways and fix any cracks.  Remove anything that might make you trip as you walk through a door, such as a raised step or threshold.  Trim any bushes or trees on the path to your home.  Use bright outdoor lighting.  Clear any walking paths of anything that might make someone trip, such as rocks or tools.  Regularly check to see if handrails are loose or broken. Make sure that both sides of any steps have handrails.  Any raised decks and porches should have guardrails on the edges.  Have any leaves, snow, or ice cleared regularly.  Use sand or salt on walking paths during winter.  Clean up any spills in your garage right away. This includes oil or grease spills. What can I do in the bathroom?  Use night lights.  Install grab bars by the toilet and in the tub and shower. Do not use towel bars as grab bars.  Use non-skid mats or decals in the tub or shower.  If you  need to sit down in the shower, use a plastic, non-slip stool.  Keep the floor dry. Clean up any water that spills on the floor as soon as it happens.  Remove soap buildup in the tub or shower regularly.  Attach bath mats securely with double-sided non-slip rug tape.  Do not have throw rugs and other things on the floor that can make you trip. What can I do in the bedroom?  Use night lights.  Make sure that you have a light by your bed that is easy to reach.  Do not use any sheets or blankets that are too big for your bed. They should not hang down onto the floor.  Have a firm chair that has side arms. You can use this for support while you get dressed.  Do not have throw rugs and other things on the floor that can make you trip. What can I do in the kitchen?  Clean up any spills right away.  Avoid walking on wet floors.  Keep items that you use a lot in easy-to-reach places.  If you need to reach something above you, use a strong step stool that has a grab bar.  Keep electrical cords out of the way.  Do not  use floor polish or wax that makes floors slippery. If you must use wax, use non-skid floor wax.  Do not have throw rugs and other things on the floor that can make you trip. What can I do with my stairs?  Do not leave any items on the stairs.  Make sure that there are handrails on both sides of the stairs and use them. Fix handrails that are broken or loose. Make sure that handrails are as long as the stairways.  Check any carpeting to make sure that it is firmly attached to the stairs. Fix any carpet that is loose or worn.  Avoid having throw rugs at the top or bottom of the stairs. If you do have throw rugs, attach them to the floor with carpet tape.  Make sure that you have a light switch at the top of the stairs and the bottom of the stairs. If you do not have them, ask someone to add them for you. What else can I do to help prevent falls?  Wear shoes  that:  Do not have high heels.  Have rubber bottoms.  Are comfortable and fit you well.  Are closed at the toe. Do not wear sandals.  If you use a stepladder:  Make sure that it is fully opened. Do not climb a closed stepladder.  Make sure that both sides of the stepladder are locked into place.  Ask someone to hold it for you, if possible.  Clearly mark and make sure that you can see:  Any grab bars or handrails.  First and last steps.  Where the edge of each step is.  Use tools that help you move around (mobility aids) if they are needed. These include:  Canes.  Walkers.  Scooters.  Crutches.  Turn on the lights when you go into a dark area. Replace any light bulbs as soon as they burn out.  Set up your furniture so you have a clear path. Avoid moving your furniture around.  If any of your floors are uneven, fix them.  If there are any pets around you, be aware of where they are.  Review your medicines with your doctor. Some medicines can make you feel dizzy. This can increase your chance of falling. Ask your doctor what other things that you can do to help prevent falls. This information is not intended to replace advice given to you by your health care provider. Make sure you discuss any questions you have with your health care provider. Document Released: 08/08/2009 Document Revised: 03/19/2016 Document Reviewed: 11/16/2014  2017 [employer]

## 2016-09-24 DIAGNOSIS — M25562 Pain in left knee: Secondary | ICD-10-CM | POA: Diagnosis not present

## 2016-09-24 DIAGNOSIS — M6281 Muscle weakness (generalized): Secondary | ICD-10-CM | POA: Diagnosis not present

## 2016-09-24 DIAGNOSIS — R262 Difficulty in walking, not elsewhere classified: Secondary | ICD-10-CM | POA: Diagnosis not present

## 2016-09-24 NOTE — Progress Notes (Signed)
I reviewed the above note and agree.  Eric Sonnenberg, M.D. 

## 2016-09-28 DIAGNOSIS — M25562 Pain in left knee: Secondary | ICD-10-CM | POA: Diagnosis not present

## 2016-09-29 DIAGNOSIS — M6281 Muscle weakness (generalized): Secondary | ICD-10-CM | POA: Diagnosis not present

## 2016-09-29 DIAGNOSIS — R262 Difficulty in walking, not elsewhere classified: Secondary | ICD-10-CM | POA: Diagnosis not present

## 2016-09-29 DIAGNOSIS — M25562 Pain in left knee: Secondary | ICD-10-CM | POA: Diagnosis not present

## 2016-09-29 NOTE — Progress Notes (Signed)
  I have reviewed the above information and agree with above.   Deacon Gadbois, MD 

## 2016-10-01 DIAGNOSIS — M6281 Muscle weakness (generalized): Secondary | ICD-10-CM | POA: Diagnosis not present

## 2016-10-01 DIAGNOSIS — M25562 Pain in left knee: Secondary | ICD-10-CM | POA: Diagnosis not present

## 2016-10-01 DIAGNOSIS — R262 Difficulty in walking, not elsewhere classified: Secondary | ICD-10-CM | POA: Diagnosis not present

## 2016-10-06 DIAGNOSIS — M2392 Unspecified internal derangement of left knee: Secondary | ICD-10-CM | POA: Diagnosis not present

## 2016-10-06 DIAGNOSIS — M1712 Unilateral primary osteoarthritis, left knee: Secondary | ICD-10-CM | POA: Diagnosis not present

## 2016-10-06 DIAGNOSIS — M25562 Pain in left knee: Secondary | ICD-10-CM | POA: Diagnosis not present

## 2016-10-13 DIAGNOSIS — M25562 Pain in left knee: Secondary | ICD-10-CM | POA: Diagnosis not present

## 2016-10-13 DIAGNOSIS — R262 Difficulty in walking, not elsewhere classified: Secondary | ICD-10-CM | POA: Diagnosis not present

## 2016-10-13 DIAGNOSIS — M6281 Muscle weakness (generalized): Secondary | ICD-10-CM | POA: Diagnosis not present

## 2016-10-15 ENCOUNTER — Ambulatory Visit (INDEPENDENT_AMBULATORY_CARE_PROVIDER_SITE_OTHER): Payer: Medicare Other

## 2016-10-15 DIAGNOSIS — M25562 Pain in left knee: Secondary | ICD-10-CM | POA: Diagnosis not present

## 2016-10-15 DIAGNOSIS — M810 Age-related osteoporosis without current pathological fracture: Secondary | ICD-10-CM

## 2016-10-15 DIAGNOSIS — R262 Difficulty in walking, not elsewhere classified: Secondary | ICD-10-CM | POA: Diagnosis not present

## 2016-10-15 DIAGNOSIS — M6281 Muscle weakness (generalized): Secondary | ICD-10-CM | POA: Diagnosis not present

## 2016-10-15 MED ORDER — DENOSUMAB 60 MG/ML ~~LOC~~ SOLN
60.0000 mg | Freq: Once | SUBCUTANEOUS | Status: AC
  Administered 2016-10-15: 60 mg via SUBCUTANEOUS

## 2016-10-15 NOTE — Progress Notes (Signed)
Patient comes in for Prolia injection.  Injected right arm subcutaneously.  Patient tolerated injected well.

## 2016-10-19 NOTE — Progress Notes (Signed)
  I have reviewed the above information and agree with above.   Xylina Rhoads, MD 

## 2016-10-22 ENCOUNTER — Telehealth: Payer: Self-pay

## 2016-10-22 NOTE — Telephone Encounter (Signed)
Pt notified of cologuard was cx due to not responding to them in a year. Patient will come in on her appt on 11/02/16 and discuss it with you

## 2016-10-27 DIAGNOSIS — M25562 Pain in left knee: Secondary | ICD-10-CM | POA: Diagnosis not present

## 2016-10-27 DIAGNOSIS — M6281 Muscle weakness (generalized): Secondary | ICD-10-CM | POA: Diagnosis not present

## 2016-10-27 DIAGNOSIS — R262 Difficulty in walking, not elsewhere classified: Secondary | ICD-10-CM | POA: Diagnosis not present

## 2016-10-29 DIAGNOSIS — M6281 Muscle weakness (generalized): Secondary | ICD-10-CM | POA: Diagnosis not present

## 2016-10-29 DIAGNOSIS — R262 Difficulty in walking, not elsewhere classified: Secondary | ICD-10-CM | POA: Diagnosis not present

## 2016-10-29 DIAGNOSIS — M25562 Pain in left knee: Secondary | ICD-10-CM | POA: Diagnosis not present

## 2016-11-02 ENCOUNTER — Ambulatory Visit (INDEPENDENT_AMBULATORY_CARE_PROVIDER_SITE_OTHER): Payer: Medicare Other | Admitting: Internal Medicine

## 2016-11-02 ENCOUNTER — Encounter: Payer: Self-pay | Admitting: Internal Medicine

## 2016-11-02 VITALS — BP 130/60 | HR 75 | Temp 97.8°F | Resp 16 | Ht 62.5 in | Wt 144.4 lb

## 2016-11-02 DIAGNOSIS — G459 Transient cerebral ischemic attack, unspecified: Secondary | ICD-10-CM

## 2016-11-02 DIAGNOSIS — I10 Essential (primary) hypertension: Secondary | ICD-10-CM

## 2016-11-02 DIAGNOSIS — E559 Vitamin D deficiency, unspecified: Secondary | ICD-10-CM | POA: Diagnosis not present

## 2016-11-02 DIAGNOSIS — M353 Polymyalgia rheumatica: Secondary | ICD-10-CM

## 2016-11-02 DIAGNOSIS — M25562 Pain in left knee: Secondary | ICD-10-CM

## 2016-11-02 DIAGNOSIS — E039 Hypothyroidism, unspecified: Secondary | ICD-10-CM | POA: Diagnosis not present

## 2016-11-02 MED ORDER — PREDNISONE 5 MG PO TABS: 5.0000 mg | ORAL_TABLET | Freq: Every day | ORAL | 3 refills | Status: DC

## 2016-11-02 NOTE — Patient Instructions (Signed)
Your blood pressure is fine on metoprolol alone,  50 mg twice daily  You can make an RN appointment to have your home  Blood pressure machine checked out   I have refilled your prednisone at 5 mg daily for your joint pain  please return for fasting  Labs SOON

## 2016-11-02 NOTE — Progress Notes (Signed)
Subjective:  Patient ID: [name], female    DOB: 19-Apr-1927  Age: 83 y.o. MRN: [id number]  CC: The primary encounter diagnosis was Essential hypertension. Diagnoses of Vitamin D deficiency, Transient cerebral ischemia, unspecified type, Acquired hypothyroidism, PMR (polymyalgia rheumatica) (Spr[place]), and Left anterior knee pain were also pertinent to this visit.  HPI [name] presents for follow up on Hypertension: patient checks blood pressure twice weekly at home.  Readings have been for the most part > 150/80 at rest . Her daughter has checked it against her own machine and found it to be accurate Patient is following a reduced salt diet most days and is taking medications as prescribed.   Taking metoprolol,  amlodipine and losartan per June visit ,  But stopped taking amlodipine and losartan after last visit .  Left knee pain : treated by KK with meloxicam .  No change in knee pain.  Had a fall on thanksgiving day and then a week later went to [facility] . Became unbearable after a trip to [facility] with daughters,  Martin Majestic to  [employer] PA who injected it with cortisone with excellent results.  No MRI planned uness the pain returns,   Still seeing  A PT for knee strengthening at [facility] to resume prednisone for diffuse joint pain   Lab Results  Component Value Date   TSH 1.93 04/23/2016   Lab Results  Component Value Date   CHOL 152 06/10/2016   HDL 55.10 06/10/2016   LDLCALC 71 06/10/2016   LDLDIRECT 78.0 06/10/2016   TRIG 131.0 06/10/2016   CHOLHDL 3 06/10/2016   Not fasting today .[name]  Stopped the atorvastatin 6 weeks ago, around thanksgiving.  Wants to lower cholesterol without medications.   Outpatient Medications Prior to Visit  Medication Sig Dispense Refill  . aspirin EC 81 MG tablet Take 81 mg by mouth every evening.    [name] levothyroxine (SYNTHROID, LEVOTHROID) 100 MCG tablet TAKE 1 TABLET BY MOUTH ONCE DAILY ON AN EMPTY STOMACH. WAIT 30 MINUTES BEFORE TAKING OTHER MEDS. 30  tablet 5  . metoprolol tartrate (LOPRESSOR) 25 MG tablet TAKE 2 TABLETS BY MOUTH TWICE DAILY 120 tablet 2  . atorvastatin (LIPITOR) 20 MG tablet Take 1 tablet (20 mg total) by mouth daily. 90 tablet 3  . amLODipine (NORVASC) 2.5 MG tablet Reported on 05/01/2016    . conjugated estrogens (PREMARIN) vaginal cream Place 1 Applicatorful vaginally daily.    [name] conjugated estrogens (PREMARIN) vaginal cream Place 1 Applicatorful vaginally daily. Apply 0.5mg  (pea-sized amount)  just inside the vaginal introitus with a finger-tip every night for two weeks and then Monday, Wednesday and Friday nights. 30 g 12  . estradiol (ESTRACE VAGINAL) 0.1 MG/GM vaginal cream Apply 0.5mg  (pea-sized amount)  just inside the vaginal introitus with a finger-tip every night for two weeks and then Monday, Wednesday and Friday nights. 30 g 12  . losartan (COZAAR) 50 MG tablet Take 1 tablet (50 mg total) by mouth daily. 30 tablet 3  . losartan (COZAAR) 50 MG tablet TAKE 1 TABLET BY MOUTH ONCE DAILY 30 tablet 5   No facility-administered medications prior to visit.     Review of Systems;  Patient denies headache, fevers, malaise, unintentional weight loss, skin rash, eye pain, sinus congestion and sinus pain, sore throat, dysphagia,  hemoptysis , cough, dyspnea, wheezing, chest pain, palpitations, orthopnea, edema, abdominal pain, nausea, melena, diarrhea, constipation, flank pain, dysuria, hematuria, urinary  Frequency, nocturia, numbness, tingling, seizures,  Focal  weakness, Loss of consciousness,  Tremor, insomnia, depression, anxiety, and suicidal ideation.      Objective:  BP 130/60   Pulse 75   Temp 97.8 F (36.6 C) (Oral)   Resp 16   Ht 5' 2.5" (1.588 m)   Wt 144 lb 6 oz (65.5 kg)   SpO2 (!) 8%   BMI 25.99 kg/m   BP Readings from Last 3 Encounters:  11/02/16 130/60  09/22/16 132/70  08/20/16 123/68    Wt Readings from Last 3 Encounters:  11/02/16 144 lb 6 oz (65.5 kg)  09/22/16 144 lb 6.4 oz (65.5 kg)    08/20/16 146 lb 4.8 oz (66.4 kg)    General appearance: alert, cooperative and appears stated age Ears: normal TM's and external ear canals both ears Throat: lips, mucosa, and tongue normal; teeth and gums normal Neck: no adenopathy, no carotid bruit, supple, symmetrical, trachea midline and thyroid not enlarged, symmetric, no tenderness/mass/nodules Back: symmetric, no curvature. ROM normal. No CVA tenderness. Lungs: clear to auscultation bilaterally Heart: regular rate and rhythm, S1, S2 normal, no murmur, click, rub or gallop Abdomen: soft, non-tender; bowel sounds normal; no masses,  no organomegaly Pulses: 2+ and symmetric Skin: Skin color, texture, turgor normal. No rashes or lesions Lymph nodes: Cervical, supraclavicular, and axillary nodes normal.  Lab Results  Component Value Date   HGBA1C 5.8 09/13/2015   HGBA1C 6.0 03/05/2015    Lab Results  Component Value Date   CREATININE 0.86 06/10/2016   CREATININE 0.91 04/23/2016   CREATININE 0.87 04/14/2016    Lab Results  Component Value Date   WBC 11.1 (H) 04/14/2016   HGB 13.8 04/14/2016   HCT 41.5 04/14/2016   PLT 257 04/14/2016   GLUCOSE 100 (H) 06/10/2016   CHOL 152 06/10/2016   TRIG 131.0 06/10/2016   HDL 55.10 06/10/2016   LDLDIRECT 78.0 06/10/2016   LDLCALC 71 06/10/2016   ALT 16 06/10/2016   AST 19 06/10/2016   NA 139 06/10/2016   K 4.8 06/10/2016   CL 104 06/10/2016   CREATININE 0.86 06/10/2016   BUN 16 06/10/2016   CO2 31 06/10/2016   TSH 1.93 04/23/2016   INR 0.96 04/14/2016   HGBA1C 5.8 09/13/2015    Mr Angiogram Neck W Wo Contrast  Result Date: 04/15/2016 CLINICAL DATA:  Difficulty focusing for 2 hours. History of subdural hematoma, hypertension. EXAM: MRI HEAD WITHOUT AND WITH CONTRAST. MRA NECK WITHOUT AND WITH CONTRAST TECHNIQUE: Multiplanar, multiecho pulse sequences of the brain and surrounding structures were obtained without and with intravenous contrast. Multiplanar, multiecho pulse  sequences of the neck and surrounding structures were obtained without and with intravenous contrast. CONTRAST:  64mL MULTIHANCE GADOBENATE DIMEGLUMINE 529 MG/ML IV SOLN COMPARISON:  CT HEAD April 22, 2016 at 1812 hours FINDINGS: MRI HEAD FINDINGS INTRACRANIAL CONTENTS: No reduced diffusion to suggest acute ischemia. No susceptibility artifact to suggest hemorrhage. The ventricles and sulci are normal for patient's age. Old small LEFT cerebellar infarct. Scattered supratentorial and to lesser extent pontine subcentimeter white matter FLAIR T2 hyperintensities are less than expected for age. Old small LEFT basal ganglia lacunar infarct. No suspicious parenchymal signal, masses or mass effect. No abnormal extra-axial fluid collections. No abnormal intracranial enhancement. No extra-axial masses though, contrast enhanced sequences would be more sensitive. Normal major intracranial vascular flow voids present at skull base. ORBITS: The included ocular globes and orbital contents are non-suspicious. Status post bilateral ocular lens implants. SINUSES: Paranasal sinuses are well-aerated. Small bilateral mastoid effusions. SKULL/SOFT TISSUES: No  abnormal sellar expansion. No suspicious calvarial bone marrow signal. 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 Craniocervical junction maintained. Small RIGHT occipital scalp lipoma. MRI NECK FINDINGS Source images and MIP image were reviewed. The common carotid arteries are widely patent bilaterally. The carotid bifurcation is patent bilaterally and there is no significant carotid stenosis. Both vertebral arteries and patent to the basilar without significant vertebral stenosis. There is no evidence for atherosclerosis or flow limiting stenosis. IMPRESSION: MRI HEAD: No acute intracranial process. 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 Mild white matter changes compatible with chronic small vessel  ischemic disease. MRA NECK: Negative. Electronically Signed   By: Elon Alas M.D.   On: 04/15/2016 01:17   Mr Jeri Cos [id number] Contrast  Result Date: 04/15/2016 CLINICAL DATA:  Difficulty focusing for 2 hours. History of subdural hematoma, hypertension. EXAM: MRI HEAD WITHOUT AND WITH CONTRAST. MRA NECK WITHOUT AND WITH CONTRAST TECHNIQUE: Multiplanar, multiecho pulse sequences of the brain and surrounding structures were obtained without and with intravenous contrast. Multiplanar, multiecho pulse sequences of the neck and surrounding structures were obtained without and with intravenous contrast. CONTRAST:  65mL MULTIHANCE GADOBENATE DIMEGLUMINE 529 MG/ML IV SOLN COMPARISON:  CT HEAD April 22, 2016 at 1812 hours FINDINGS: MRI HEAD FINDINGS INTRACRANIAL CONTENTS: No reduced diffusion to suggest acute ischemia. No susceptibility artifact to suggest hemorrhage. The ventricles and sulci are normal for patient's age. Old small LEFT cerebellar infarct. Scattered supratentorial and to lesser extent pontine subcentimeter white matter FLAIR T2 hyperintensities are less than expected for age. Old small LEFT basal ganglia lacunar infarct. No suspicious parenchymal signal, masses or mass effect. No abnormal extra-axial fluid collections. No abnormal intracranial enhancement. No extra-axial masses though, contrast enhanced sequences would be more sensitive. Normal major intracranial vascular flow voids present at skull base. ORBITS: The included ocular globes and orbital contents are non-suspicious. Status post bilateral ocular lens implants. SINUSES: Paranasal sinuses are well-aerated. Small bilateral mastoid effusions. SKULL/SOFT TISSUES: No abnormal sellar expansion. No suspicious calvarial bone marrow signal. 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 Craniocervical junction maintained. Small RIGHT occipital scalp lipoma. MRI NECK FINDINGS Source images and MIP image  were reviewed. The common carotid arteries are widely patent bilaterally. The carotid bifurcation is patent bilaterally and there is no significant carotid stenosis. Both vertebral arteries and patent to the basilar without significant vertebral stenosis. There is no evidence for atherosclerosis or flow limiting stenosis. IMPRESSION: MRI HEAD: No acute intracranial process. 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 Mild white matter changes compatible with chronic small vessel ischemic disease. MRA NECK: Negative. Electronically Signed   By: Elon Alas M.D.   On: 04/15/2016 01:17    Assessment & Plan:   Problem List Items Addressed This Visit    Essential hypertension - Primary    Managed with metoprolol alone.  Readings here are normal.       Relevant Orders   Comprehensive metabolic panel   Left anterior knee pain    Secondary to DJD aggravated by prolonged walking.  Currently pain free since she received a cortisone injection by [employer] [facility]       PMR (polymyalgia rheumatica) (HCC)    Sh e has had increased joint pain and wants to resume 5 mg prednisone daily       Relevant Orders   Sedimentation rate   TIA (transient ischemic attack)  Relevant Orders   Lipid panel   Vitamin D deficiency   Relevant Orders   VITAMIN D 25 Hydroxy (Vit-D Deficiency, Fractures)    Other Visit Diagnoses    Acquired hypothyroidism       Relevant Orders   TSH      I have discontinued Ms. Downs's losartan, atorvastatin, amLODipine, conjugated estrogens, losartan, conjugated estrogens, and estradiol. I am also having her start on predniSONE. Additionally, I am having her maintain her aspirin EC, levothyroxine, and metoprolol tartrate.  Meds ordered this encounter  Medications  . predniSONE (DELTASONE) 5 MG tablet    Sig: Take 1 tablet (5 mg total) by mouth daily with breakfast.    Dispense:  30 tablet    Refill:  3    Medications Discontinued During This  Encounter  Medication Reason  . estradiol (ESTRACE VAGINAL) 0.1 MG/GM vaginal cream Cost of medication  . atorvastatin (LIPITOR) 20 MG tablet Patient Discharge  . conjugated estrogens (PREMARIN) vaginal cream Completed Course  . conjugated estrogens (PREMARIN) vaginal cream Completed Course  . losartan (COZAAR) 50 MG tablet Completed Course  . losartan (COZAAR) 50 MG tablet Completed Course  . amLODipine (NORVASC) 2.5 MG tablet     Follow-up: No Follow-up on file.   Crecencio Mc, MD

## 2016-11-03 ENCOUNTER — Other Ambulatory Visit: Payer: Medicare Other

## 2016-11-03 DIAGNOSIS — M25562 Pain in left knee: Secondary | ICD-10-CM | POA: Diagnosis not present

## 2016-11-03 DIAGNOSIS — R262 Difficulty in walking, not elsewhere classified: Secondary | ICD-10-CM | POA: Diagnosis not present

## 2016-11-03 DIAGNOSIS — M6281 Muscle weakness (generalized): Secondary | ICD-10-CM | POA: Diagnosis not present

## 2016-11-03 NOTE — Assessment & Plan Note (Signed)
Managed with metoprolol alone.  Readings here are normal.

## 2016-11-03 NOTE — Assessment & Plan Note (Signed)
Sh e has had increased joint pain and wants to resume 5 mg prednisone daily

## 2016-11-03 NOTE — Assessment & Plan Note (Signed)
Secondary to DJD aggravated by prolonged walking.  Currently pain free since she received a cortisone injection by [employer] [facility]

## 2016-11-05 ENCOUNTER — Ambulatory Visit (INDEPENDENT_AMBULATORY_CARE_PROVIDER_SITE_OTHER): Payer: Medicare Other

## 2016-11-05 ENCOUNTER — Other Ambulatory Visit (INDEPENDENT_AMBULATORY_CARE_PROVIDER_SITE_OTHER): Payer: Medicare Other

## 2016-11-05 VITALS — BP 138/66 | HR 73 | Resp 16

## 2016-11-05 DIAGNOSIS — E559 Vitamin D deficiency, unspecified: Secondary | ICD-10-CM | POA: Diagnosis not present

## 2016-11-05 DIAGNOSIS — I10 Essential (primary) hypertension: Secondary | ICD-10-CM

## 2016-11-05 DIAGNOSIS — G459 Transient cerebral ischemic attack, unspecified: Secondary | ICD-10-CM

## 2016-11-05 DIAGNOSIS — M6281 Muscle weakness (generalized): Secondary | ICD-10-CM | POA: Diagnosis not present

## 2016-11-05 DIAGNOSIS — R262 Difficulty in walking, not elsewhere classified: Secondary | ICD-10-CM | POA: Diagnosis not present

## 2016-11-05 DIAGNOSIS — E039 Hypothyroidism, unspecified: Secondary | ICD-10-CM | POA: Diagnosis not present

## 2016-11-05 DIAGNOSIS — M25562 Pain in left knee: Secondary | ICD-10-CM | POA: Diagnosis not present

## 2016-11-05 DIAGNOSIS — M353 Polymyalgia rheumatica: Secondary | ICD-10-CM

## 2016-11-05 LAB — LIPID PANEL
Cholesterol: 257 mg/dL — ABNORMAL HIGH (ref 0–200)
HDL: 55.9 mg/dL (ref >39.00)
LDL Cholesterol: 172 mg/dL — ABNORMAL HIGH (ref 0–99)
NONHDL: 200.88
Total CHOL/HDL Ratio: 5
Triglycerides: 146 mg/dL (ref 0.0–149.0)
VLDL: 29.2 mg/dL (ref 0.0–40.0)

## 2016-11-05 LAB — TSH: TSH: 2.28 u[IU]/mL (ref 0.35–4.50)

## 2016-11-05 LAB — COMPREHENSIVE METABOLIC PANEL
ALBUMIN: 4.2 g/dL (ref 3.5–5.2)
ALT: 16 U/L (ref 0–35)
AST: 19 U/L (ref 0–37)
Alkaline Phosphatase: 70 U/L (ref 39–117)
BILIRUBIN TOTAL: 0.4 mg/dL (ref 0.2–1.2)
BUN: 21 mg/dL (ref 6–23)
CALCIUM: 9.6 mg/dL (ref 8.4–10.5)
CO2: 29 mEq/L (ref 19–32)
CREATININE: 0.92 mg/dL (ref 0.40–1.20)
Chloride: 100 mEq/L (ref 96–112)
GFR: 61.29 mL/min (ref >60.00)
Glucose, Bld: 133 mg/dL — ABNORMAL HIGH (ref 70–99)
Potassium: 5.2 mEq/L — ABNORMAL HIGH (ref 3.5–5.1)
SODIUM: 138 meq/L (ref 135–145)
Total Protein: 6.9 g/dL (ref 6.0–8.3)

## 2016-11-05 LAB — SEDIMENTATION RATE: SED RATE: 5 mm/h (ref 0–30)

## 2016-11-05 LAB — VITAMIN D 25 HYDROXY (VIT D DEFICIENCY, FRACTURES): VITD: 24.2 ng/mL — AB (ref 30.00–100.00)

## 2016-11-05 NOTE — Progress Notes (Addendum)
Patient comes in for blood pressure check. Patient is complaint with blood pressure medication. She brings in home monitor.  Checked right arm with home machine reading  was 142/74.   Please advise.    Reviewed.  Will confirm compliant with medication.  Will forward information to Dr Derrel Nip.   Dr Nicki Reaper

## 2016-11-09 ENCOUNTER — Other Ambulatory Visit (INDEPENDENT_AMBULATORY_CARE_PROVIDER_SITE_OTHER): Payer: Medicare Other

## 2016-11-09 ENCOUNTER — Other Ambulatory Visit: Payer: Self-pay | Admitting: Internal Medicine

## 2016-11-09 ENCOUNTER — Telehealth: Payer: Self-pay | Admitting: *Deleted

## 2016-11-09 ENCOUNTER — Telehealth: Payer: Self-pay | Admitting: Radiology

## 2016-11-09 DIAGNOSIS — E875 Hyperkalemia: Secondary | ICD-10-CM

## 2016-11-09 MED ORDER — ERGOCALCIFEROL 1.25 MG (50000 UT) PO CAPS: 50000.0000 [IU] | ORAL_CAPSULE | ORAL | 2 refills | Status: DC

## 2016-11-09 NOTE — Telephone Encounter (Signed)
YES, PER  MYCHART MESSAGE ABOUT LOW D.  MED SENT

## 2016-11-09 NOTE — Telephone Encounter (Signed)
Dr. Derrel Nip Please advise I do not see anything in her last OV 11/02/16 she was to be on any vitamin D 5000. [place]

## 2016-11-09 NOTE — Telephone Encounter (Signed)
.  lasttsh 

## 2016-11-09 NOTE — Telephone Encounter (Signed)
PT coming for labs today, please place orders. Thank you.

## 2016-11-09 NOTE — Telephone Encounter (Signed)
[facility] stated that patient was under the impression that she was to receive a vitamin 5000 units

## 2016-11-10 DIAGNOSIS — M6281 Muscle weakness (generalized): Secondary | ICD-10-CM | POA: Diagnosis not present

## 2016-11-10 DIAGNOSIS — M25562 Pain in left knee: Secondary | ICD-10-CM | POA: Diagnosis not present

## 2016-11-10 DIAGNOSIS — R262 Difficulty in walking, not elsewhere classified: Secondary | ICD-10-CM | POA: Diagnosis not present

## 2016-11-10 LAB — BASIC METABOLIC PANEL
BUN: 19 mg/dL (ref 6–23)
CALCIUM: 9.5 mg/dL (ref 8.4–10.5)
CO2: 30 mEq/L (ref 19–32)
CREATININE: 0.99 mg/dL (ref 0.40–1.20)
Chloride: 100 mEq/L (ref 96–112)
GFR: 56.31 mL/min — ABNORMAL LOW (ref >60.00)
Glucose, Bld: 77 mg/dL (ref 70–99)
Potassium: 4.6 mEq/L (ref 3.5–5.1)
Sodium: 139 mEq/L (ref 135–145)

## 2016-11-10 NOTE — Telephone Encounter (Signed)
Medication was filled at pharmacy

## 2016-11-11 ENCOUNTER — Encounter: Payer: Self-pay | Admitting: Internal Medicine

## 2016-11-16 DIAGNOSIS — M25562 Pain in left knee: Secondary | ICD-10-CM | POA: Diagnosis not present

## 2016-11-16 DIAGNOSIS — R262 Difficulty in walking, not elsewhere classified: Secondary | ICD-10-CM | POA: Diagnosis not present

## 2016-11-16 DIAGNOSIS — M6281 Muscle weakness (generalized): Secondary | ICD-10-CM | POA: Diagnosis not present

## 2016-11-26 DIAGNOSIS — M6281 Muscle weakness (generalized): Secondary | ICD-10-CM | POA: Diagnosis not present

## 2016-11-26 DIAGNOSIS — R262 Difficulty in walking, not elsewhere classified: Secondary | ICD-10-CM | POA: Diagnosis not present

## 2016-11-26 DIAGNOSIS — M25562 Pain in left knee: Secondary | ICD-10-CM | POA: Diagnosis not present

## 2016-11-30 ENCOUNTER — Other Ambulatory Visit: Payer: Self-pay | Admitting: Internal Medicine

## 2016-12-22 ENCOUNTER — Ambulatory Visit
Admission: EM | Admit: 2016-12-22 | Discharge: 2016-12-22 | Disposition: A | Discharge status: Home / Self Care | Payer: Medicare Other | Attending: Emergency Medicine | Admitting: Emergency Medicine

## 2016-12-22 DIAGNOSIS — I10 Essential (primary) hypertension: Secondary | ICD-10-CM

## 2016-12-22 NOTE — ED Triage Notes (Signed)
Pt reports she wants her blood pressure checked because she noticed it was running high yesterday. Denies symptoms.

## 2016-12-22 NOTE — ED Provider Notes (Signed)
HPI  SUBJECTIVE:  [name] is a 83 y.o. female who presents with 2 days of elevated blood pressure readings at home. She states she measured her blood pressure yesterday and it was 180/90. Measured it this morning and it measured 165 over 80s. She states that she is compliant with her metoprolol 50 mg twice a day. She denies any change in diet, states that she is not adding salt to her food. She states that she has not been going out to eat at restaurants recently. She reports some pain in her arms from her polymyalgia rheumatica but states that it is bearable. She denies any severe pain anywhere else. There is been no change in her medicines other than her discontinuing her prednisone one month ago. She states her blood pressure was recently refurbished. States that she got it back from the company last Saturday. She has not been taking her blood pressure for a month. She did not bring the machine in today. Pt denies HA, visual changes, focal paresis, or seizures. No CP, dyspnea, palpitations, pedal edema, tearing pain radiating to back or abd, DOE. no anuria or hematuria. No cocaine or nasal decongestants. Patient was discontinued on losartan and amlodipine on 11/02/2016 by PMD. She is on 50 mg metoprolol by mouth twice a day. Follow-up visit on 1/11 showed blood pressure under good control. She was also started on prednisone 5 mg daily. She has a past medical history including TIA, stroke, hypertension, polymyalgia rheumatica. She is status post a craniotomy for subdural hematoma evacuation. PCP: Crecencio Mc, MD   Past Medical History:  Diagnosis Date  . Arthritis   . Carpal tunnel syndrome   . Gait abnormality    has for 52yr-no use of cane  . Hx of TIA (transient ischemic attack) and stroke   . Hypertension   . Hypothyroidism   . Osteoporosis   . Wears glasses   . Wears hearing aid     Past Surgical History:  Procedure Laterality Date  . APPENDECTOMY    . CARPAL TUNNEL RELEASE  Left 07/11/2013   Procedure: CARPAL TUNNEL RELEASE;  Surgeon: Cammie Sickle., MD;  Location: [place];  Service: Orthopedics;  Laterality: Left;  . COLONOSCOPY    . SUBDURAL HEMATOMA EVACUATION VIA CRANIOTOMY  2007  . TONSILLECTOMY      Family History  Problem Relation Age of Onset  . Cancer Mother 66    breast ca  . Cancer Maternal Aunt   . Cancer Brother     pancreatic ca  . Breast cancer Sister   . Brain cancer Brother     astrocytoma?  . Kidney cancer Sister     Social History  Substance Use Topics  . Smoking status: Never Smoker  . Smokeless tobacco: Never Used  . Alcohol use Yes     Comment: occ    No current facility-administered medications for this encounter.   Current Outpatient Prescriptions:  .  aspirin EC 81 MG tablet, Take 81 mg by mouth every evening., Disp: , Rfl:  .  ergocalciferol (DRISDOL) 50000 units capsule, Take 1 capsule (50,000 Units total) by mouth once a week., Disp: 4 capsule, Rfl: 2 .  levothyroxine (SYNTHROID, LEVOTHROID) 100 MCG tablet, TAKE 1 TABLET BY MOUTH ONCE DAILY ON EMPTY STOMACH,WAIT 30 MINUTES BEFORE TAKING OTHER MEDS, Disp: 30 tablet, Rfl: 9 .  metoprolol tartrate (LOPRESSOR) 25 MG tablet, TAKE 2 TABLETS BY MOUTH TWICE DAILY, Disp: 120 tablet, Rfl: 5  No  Known Allergies   ROS  As noted in HPI.   Physical Exam  BP (!) 167/70 (BP Location: Left Arm)   Pulse 70   Temp 97.5 F (36.4 C) (Oral)   Resp 16   Ht 5' 2.5" (1.588 m)   Wt 143 lb (64.9 kg)   SpO2 100%   BMI 25.74 kg/m    BP Readings from Last 3 Encounters:  12/22/16 (!) 167/70  11/05/16 138/66  11/02/16 130/60   Constitutional: Well developed, well nourished, no acute distress Eyes: PERRL, EOMI, conjunctiva normal bilaterally HENT: Normocephalic, atraumatic,mucus membranes moist Respiratory: Clear to auscultation bilaterally, no rales, no wheezing, no rhonchi Cardiovascular: Normal rate and rhythm, no murmurs, no gallops, no rubs GI:  Soft, nondistended, normal bowel sounds, nontender, no rebound, no guarding Back: no CVAT skin: No rash, skin intact Musculoskeletal: Calves symmetric, nontender No edema, no tenderness, no deformities Neurologic: Alert & oriented x 3, CN II-XII grossly intact, no motor deficits, sensation grossly intact gait steady, speech fluent. Psychiatric: Speech and behavior appropriate   ED Course   Medications - No data to display  No orders of the defined types were placed in this encounter.  No results found for this or any previous visit (from the past 24 hour(s)). No results found.  ED Clinical Impression  Essential hypertension   ED Assessment/Plan  Previous records reviewed. As noted in history of present illness.  Pt hypertensive today. Pt has no historical evidence of end organ damage as noted in history of present illness. Will have patient measure her blood pressure once a day, preferably at the same time every day and keep a log of this. We'll write a note to her primary care physician and see if the patient can be seen by them in several days for blood pressure recheck and possible adjustment of her medications at that time. Advised patient to bring in her blood pressure cuff so that it can be compared against the physician's blood pressure cuff. Patient was asymptomatic today, so labs were not done and I did not adjust her medications today. Pt to f/u as OP.   Discussed  MDM, plan and followup with patient. Discussed sn/sx that should prompt return to the ED. Pt agrees with plan.   No orders of the defined types were placed in this encounter.   *This clinic note was created using Dragon dictation software. Therefore, there may be occasional mistakes despite careful proofreading.  ?   Melynda Ripple, MD 12/22/16 1340

## 2016-12-22 NOTE — Discharge Instructions (Signed)
Watch your salt intake. Continue Exercising. diet and exercise will lower your blood pressure significantly. It is important to keep your blood pressure under good control, as having a elevated for prolonged periods of time significantly increases your risk of stroke, heart attacks, kidney damage, eye damage, and other problems. Measure your blood pressure once a day, preferably at the same time every day. Keep a log of this and bring it to your next doctor's appointment. So bring your blood pressure machine with you to make sure that it is reading the same as your doctors office.  Return immediately to the ER if you start having chest pain, headache, problems seeing, problems talking, problems walking, if you feel like you're about to pass out, if you do pass out, if you have a seizure, or for any other concerns.[name]

## 2016-12-23 ENCOUNTER — Ambulatory Visit: Payer: Medicare Other | Admitting: Family

## 2016-12-30 ENCOUNTER — Encounter: Payer: Self-pay | Admitting: Internal Medicine

## 2016-12-30 ENCOUNTER — Ambulatory Visit (INDEPENDENT_AMBULATORY_CARE_PROVIDER_SITE_OTHER): Payer: Medicare Other | Admitting: Internal Medicine

## 2016-12-30 DIAGNOSIS — M353 Polymyalgia rheumatica: Secondary | ICD-10-CM

## 2016-12-30 DIAGNOSIS — I10 Essential (primary) hypertension: Secondary | ICD-10-CM

## 2016-12-30 MED ORDER — LEVOTHYROXINE SODIUM 100 MCG PO TABS: ORAL_TABLET | ORAL | 9 refills | Status: DC

## 2016-12-30 NOTE — Progress Notes (Signed)
Pre visit review using our clinic review tool, if applicable. No additional management support is needed unless otherwise documented below in the visit note. 

## 2016-12-30 NOTE — Patient Instructions (Addendum)
If your home blood pressure readings are consistently > 130/80,  We can add 2. 5 mg amlodipine daily to your regimen  YOU SHOULD BE SEATED AND QUIET FOR 5 MINUTES  BEFORE TAKING A READING    FOR YOUR POLYMYALGIA RHEUMATICA:  YOU CAN TAKE TYLENOL 500 MG EVERY 6 HOURS IF NEEDED FOR JOINT PAIN . The MAXIMUM DAILY DOSE OF tylenol is 2000 mg daily  IF THIS DOES NOT HELP RELIEVE YOUR PAIN, YOU CAN ADD ONE ALEVE TWICE DAILY ,  OR 2 MOTRIN THREE TIMES DAILY   How to Take Your Blood Pressure You can take your blood pressure at home with a machine. You may need to check your blood pressure at home:  To check if you have high blood pressure (hypertension).  To check your blood pressure over time.  To make sure your blood pressure medicine is working. Supplies needed: You will need a blood pressure machine, or monitor. You can buy one at a drugstore or online. When choosing one:  Choose one with an arm cuff.  Choose one that wraps around your upper arm. Only one finger should fit between your arm and the cuff.  Do not choose one that measures your blood pressure from your wrist or finger. Your doctor can suggest a monitor. How to prepare Avoid these things for 30 minutes before checking your blood pressure:  Drinking caffeine.  Drinking alcohol.  Eating.  Smoking.  Exercising. Five minutes before checking your blood pressure:  Pee.  Sit in a dining chair. Avoid sitting in a soft couch or armchair.  Be quiet. Do not talk. How to take your blood pressure Follow the instructions that came with your machine. If you have a digital blood pressure monitor, these may be the instructions: 1. Sit up straight. 2. Place your feet on the floor. Do not cross your ankles or legs. 3. Rest your left arm at the level of your heart. You may rest it on a table, desk, or chair. 4. Pull up your shirt sleeve. 5. Wrap the blood pressure cuff around the upper part of your left arm. The cuff should  be 1 inch (2.5 cm) above your elbow. It is best to wrap the cuff around bare skin. 6. Fit the cuff snugly around your arm. You should be able to place only one finger between the cuff and your arm. 7. Put the cord inside the groove of your elbow. 8. Press the power button. 9. Sit quietly while the cuff fills with air and loses air. 10. Write down the numbers on the screen. 11. Wait 2-3 minutes and then repeat steps 1-10. What do the numbers mean? Two numbers make up your blood pressure. The first number is called systolic pressure. The second is called diastolic pressure. An example of a blood pressure reading is "120 over 80" (or 120/80). If you are an adult and do not have a medical condition, use this guide to find out if your blood pressure is normal: Normal   First number: below 120.  Second number: below 80. Elevated   First number: 120-129.  Second number: below 80. Hypertension stage 1   First number: 130-139.  Second number: 80-89. Hypertension stage 2   First number: 140 or above.  Second number: 27 or above. Your blood pressure is above normal even if only the top or bottom number is above normal. Follow these instructions at home:  Check your blood pressure as often as your doctor tells you to.  Take your monitor to your next doctor's appointment. Your doctor will:  Make sure you are using it correctly.  Make sure it is working right.  Make sure you understand what your blood pressure numbers should be.  Tell your doctor if your medicines are causing side effects. Contact a doctor if:  Your blood pressure keeps being high. Get help right away if:  Your first blood pressure number is higher than 180.  Your second blood pressure number is higher than 120. This information is not intended to replace advice given to you by your health care provider. Make sure you discuss any questions you have with your health care provider. Document Released: 09/24/2008  Document Revised: 09/09/2016 Document Reviewed: 03/20/2016 Elsevier Interactive Patient Education  2017 [employer].   .

## 2016-12-30 NOTE — Progress Notes (Signed)
Subjective:  Patient ID: [name], female    DOB: 01-07-1927  Age: 83 y.o. MRN: [id number]  CC: Diagnoses of PMR (polymyalgia rheumatica) ([place]) and Essential hypertension were pertinent to this visit.  HPI [name] presents for follow up on hypertension.  Patient presented to ER with concern about BP elevation via multiple readings at hoe.  Seen on  feb 27 in ER and  bp was elevated but not treated.     bp at home elevated to systolic 811/91  (highest) when she checks it by herself .  She has brought her home machine for comparison/calibration and it agrees with our readings.   Outpatient Medications Prior to Visit  Medication Sig Dispense Refill  . aspirin EC 81 MG tablet Take 81 mg by mouth every evening.    . metoprolol tartrate (LOPRESSOR) 25 MG tablet TAKE 2 TABLETS BY MOUTH TWICE DAILY 120 tablet 5  . levothyroxine (SYNTHROID, LEVOTHROID) 100 MCG tablet TAKE 1 TABLET BY MOUTH ONCE DAILY ON EMPTY STOMACH,WAIT 30 MINUTES BEFORE TAKING OTHER MEDS 30 tablet 9  . ergocalciferol (DRISDOL) 50000 units capsule Take 1 capsule (50,000 Units total) by mouth once a week. (Patient not taking: Reported on 12/30/2016) 4 capsule 2   No facility-administered medications prior to visit.     Review of Systems;  Patient denies headache, fevers, malaise, unintentional weight loss, skin rash, eye pain, sinus congestion and sinus pain, sore throat, dysphagia,  hemoptysis , cough, dyspnea, wheezing, chest pain, palpitations, orthopnea, edema, abdominal pain, nausea, melena, diarrhea, constipation, flank pain, dysuria, hematuria, urinary  Frequency, nocturia, numbness, tingling, seizures,  Focal weakness, Loss of consciousness,  Tremor, insomnia, depression, anxiety, and suicidal ideation.      Objective:  BP 130/64 (BP Location: Left Arm, Patient Position: Sitting, Cuff Size: Normal)   Pulse 69   Temp 97.5 F (36.4 C) (Oral)   Resp 15   Wt 143 lb 9.6 oz (65.1 kg)   SpO2 97%   BMI 25.85  kg/m   BP Readings from Last 3 Encounters:  12/30/16 130/64  12/22/16 (!) 167/70  11/05/16 138/66    Wt Readings from Last 3 Encounters:  12/30/16 143 lb 9.6 oz (65.1 kg)  12/22/16 143 lb (64.9 kg)  11/02/16 144 lb 6 oz (65.5 kg)    General appearance: alert, cooperative and appears stated age Neck: no adenopathy, no carotid bruit, supple, symmetrical, trachea midline and thyroid not enlarged, symmetric, no tenderness/mass/nodules Back: symmetric, no curvature. ROM normal. No CVA tenderness. Lungs: clear to auscultation bilaterally Heart: regular rate and rhythm, S1, S2 normal, no murmur, click, rub or gallop Abdomen: soft, non-tender; bowel sounds normal; no masses,  no organomegaly Pulses: 2+ and symmetric Skin: Skin color, texture, turgor normal. No rashes or lesions Neuro:  awake and interactive with normal mood and affect. Higher cortical functions are normal. Speech is clear without word-finding difficulty or dysarthria. Extraocular movements are intact. Visual fields of both eyes are grossly intact. Sensation to light touch is grossly intact bilaterally of upper and lower extremities. Motor examination shows 4+/5 symmetric hand grip and upper extremity and 5/5 lower extremity strength. There is no pronation or drift. Gait is non-ataxic    Lab Results  Component Value Date   HGBA1C 5.8 09/13/2015   HGBA1C 6.0 03/05/2015    Lab Results  Component Value Date   CREATININE 0.99 11/09/2016   CREATININE 0.92 11/05/2016   CREATININE 0.86 06/10/2016    Lab Results  Component Value Date  WBC 11.1 (H) 04/14/2016   HGB 13.8 04/14/2016   HCT 41.5 04/14/2016   PLT 257 04/14/2016   GLUCOSE 77 11/09/2016   CHOL 257 (H) 11/05/2016   TRIG 146.0 11/05/2016   HDL 55.90 11/05/2016   LDLDIRECT 78.0 06/10/2016   LDLCALC 172 (H) 11/05/2016   ALT 16 11/05/2016   AST 19 11/05/2016   NA 139 11/09/2016   K 4.6 11/09/2016   CL 100 11/09/2016   CREATININE 0.99 11/09/2016   BUN 19  11/09/2016   CO2 30 11/09/2016   TSH 2.28 11/05/2016   INR 0.96 04/14/2016   HGBA1C 5.8 09/13/2015    No results found.  Assessment & Plan:   Problem List Items Addressed This Visit    Essential hypertension    Managed with metoprolol alone.home readings have been elevated due to technique, and readings here are normal. Will add amlodipine if hoe readings done correctly are > 130/80      PMR (polymyalgia rheumatica) ([facility])    Sh e has had increased joint pain and wants to resume 5 mg prednisone daily  ; however her esr is noral so I have advised against this and recommended use of tylenol         A total of 25 minutes was spent with patient more than half of which was spent in counseling patient on the above mentioned issues , reviewing and explaining recent ER visit including any labs and imaging studies done, and coordination of care.  I have discontinued Ms. Sulak's ergocalciferol. I am also having her maintain her aspirin EC, metoprolol tartrate, cholecalciferol, and levothyroxine.  Meds ordered this encounter  Medications  . cholecalciferol (VITAMIN D) 1000 units tablet    Sig: Take 1,000 Units by mouth 2 (two) times daily.  [name] levothyroxine (SYNTHROID, LEVOTHROID) 100 MCG tablet    Sig: TAKE 1 TABLET BY MOUTH ONCE DAILY ON EMPTY STOMACH,WAIT 30 MINUTES BEFORE TAKING OTHER MEDS    Dispense:  30 tablet    Refill:  9    Medications Discontinued During This Encounter  Medication Reason  . ergocalciferol (DRISDOL) 50000 units capsule Therapy completed  . levothyroxine (SYNTHROID, LEVOTHROID) 100 MCG tablet Reorder    Follow-up: No Follow-up on file.   Crecencio Mc, MD

## 2017-01-02 NOTE — Assessment & Plan Note (Signed)
Sh e has had increased joint pain and wants to resume 5 mg prednisone daily  ; however her esr is noral so I have advised against this and recommended use of tylenol

## 2017-01-02 NOTE — Assessment & Plan Note (Signed)
Managed with metoprolol alone.home readings have been elevated due to technique, and readings here are normal. Will add amlodipine if hoe readings done correctly are > 130/80

## 2017-01-05 DIAGNOSIS — L600 Ingrowing nail: Secondary | ICD-10-CM | POA: Diagnosis not present

## 2017-01-05 DIAGNOSIS — B351 Tinea unguium: Secondary | ICD-10-CM | POA: Diagnosis not present

## 2017-01-05 DIAGNOSIS — M79675 Pain in left toe(s): Secondary | ICD-10-CM | POA: Diagnosis not present

## 2017-01-18 DIAGNOSIS — M79675 Pain in left toe(s): Secondary | ICD-10-CM | POA: Diagnosis not present

## 2017-01-18 DIAGNOSIS — L600 Ingrowing nail: Secondary | ICD-10-CM | POA: Diagnosis not present

## 2017-02-03 DIAGNOSIS — L600 Ingrowing nail: Secondary | ICD-10-CM | POA: Diagnosis not present

## 2017-02-15 DIAGNOSIS — G5601 Carpal tunnel syndrome, right upper limb: Secondary | ICD-10-CM | POA: Diagnosis not present

## 2017-02-15 DIAGNOSIS — M1811 Unilateral primary osteoarthritis of first carpometacarpal joint, right hand: Secondary | ICD-10-CM | POA: Diagnosis not present

## 2017-02-15 DIAGNOSIS — M542 Cervicalgia: Secondary | ICD-10-CM | POA: Diagnosis not present

## 2017-02-15 DIAGNOSIS — M19031 Primary osteoarthritis, right wrist: Secondary | ICD-10-CM | POA: Diagnosis not present

## 2017-02-18 ENCOUNTER — Telehealth: Payer: Self-pay | Admitting: Internal Medicine

## 2017-02-18 DIAGNOSIS — M25562 Pain in left knee: Secondary | ICD-10-CM

## 2017-02-18 DIAGNOSIS — E039 Hypothyroidism, unspecified: Secondary | ICD-10-CM

## 2017-02-18 DIAGNOSIS — M353 Polymyalgia rheumatica: Secondary | ICD-10-CM

## 2017-02-18 DIAGNOSIS — Z79899 Other long term (current) drug therapy: Secondary | ICD-10-CM

## 2017-02-18 NOTE — Telephone Encounter (Signed)
FYI

## 2017-02-18 NOTE — Telephone Encounter (Signed)
Pt called about her bp being high. BP am 150/84 and last night 170/82. Pt is scheduled for Monday at 8am on Dr Caryl Bis sch. No appt avail to sch. Pt was transferred to [facility]. Thank you!

## 2017-02-18 NOTE — Telephone Encounter (Signed)
Patient Name: [name] DOB: February 15, 1927 Initial Comment Caller states her BP is 175/84. Nurse Assessment Nurse: Vallery Sa, RN, Cathy Date/Time (Eastern Time): 02/18/2017 1:25:06 PM Confirm and document reason for call. If symptomatic, describe symptoms. ---Caller states her blood pressure was 175/84 about 30 minutes ago. She has had a mild headache on and off this morning. No severe breathing difficulty. No chest pain. Alert and responsive. Does the patient have any new or worsening symptoms? ---Yes Will a triage be completed? ---Yes Related visit to physician within the last 2 weeks? ---No Does the PT have any chronic conditions? (i.e. diabetes, asthma, etc.) ---Yes List chronic conditions. ---High Blood Pressure (taking medications the way the MD directed, Thyroid problems, Low Vitamin D levels Is this a behavioral health or substance abuse call? ---No Guidelines Guideline Title Affirmed Question Affirmed Notes High Blood Pressure Systolic BP >= 725 OR Diastolic >= 366 Final Disposition User See PCP When Office is Open (within 3 days) Vallery Sa, RN, Cathy Comments Scheduled appointment 02/19/17 at 1:15 with [facility]. Referrals REFERRED TO PCP OFFICE Disagree/Comply: Comply

## 2017-02-19 ENCOUNTER — Ambulatory Visit (INDEPENDENT_AMBULATORY_CARE_PROVIDER_SITE_OTHER): Payer: Medicare Other | Admitting: Family Medicine

## 2017-02-19 ENCOUNTER — Encounter: Payer: Self-pay | Admitting: Family Medicine

## 2017-02-19 DIAGNOSIS — I10 Essential (primary) hypertension: Secondary | ICD-10-CM

## 2017-02-19 MED ORDER — AMLODIPINE BESYLATE 5 MG PO TABS: 5.0000 mg | ORAL_TABLET | Freq: Every day | ORAL | 3 refills | Status: DC

## 2017-02-19 NOTE — Patient Instructions (Signed)
Take the medication as prescribed.  Follow up with your PCP in the next few weeks.  Take care  Dr. Lacinda Axon

## 2017-02-19 NOTE — Progress Notes (Signed)
Pre visit review using our clinic review tool, if applicable. No additional management support is needed unless otherwise documented below in the visit note. 

## 2017-02-19 NOTE — Assessment & Plan Note (Signed)
Established problem, worsening. Adding Norvasc per PCPs prior recommendations.

## 2017-02-19 NOTE — Progress Notes (Signed)
   Subjective:  Patient ID: [name], female    DOB: 1926-12-05  Age: 83 y.o. MRN: [id number]  CC: Elevated BP  HPI:  83 year old female with a history of hypertension presents with complaints of elevated blood pressure.  Patient reports that her blood pressure has been elevated as of late. It started after her blood pressure was checked in the orthopedic office. She has been checking at home and has had several high readings, some of which the systolic has been in the 803O. She has no associated symptoms: Headache, visual disturbance, chest pain, shortness of breath, neurological findings. She endorses compliance with her metoprolol. She currently takes 25 mg twice daily.  Social Hx   Social History   Social History  . Marital status: Widowed    Spouse name: N/A  . Number of children: N/A  . Years of education: N/A   Social History Main Topics  . Smoking status: Never Smoker  . Smokeless tobacco: Never Used  . Alcohol use Yes     Comment: occ  . Drug use: No  . Sexual activity: Not Asked   Other Topics Concern  . None   Social History Narrative   Lives independently sons and 4 daughters live nearby and are supportive     Review of Systems  Constitutional: Negative.   Respiratory: Negative.   Cardiovascular: Negative.   Neurological: Negative.    Objective:  BP (!) 162/84   Pulse 76   Temp 98.3 F (36.8 C) (Oral)   Wt 144 lb 2 oz (65.4 kg)   SpO2 98%   BMI 25.94 kg/m   BP/Weight 02/19/2017 12/30/2016 11/17/4823  Systolic BP 003 704 888  Diastolic BP 84 64 70  Wt. (Lbs) 144.13 143.6 143  BMI 25.94 25.85 25.74   Physical Exam  Constitutional: She is oriented to person, place, and time. She appears well-developed. No distress.  Cardiovascular: Normal rate and regular rhythm.   Pulmonary/Chest: Effort normal and breath sounds normal.  Neurological: She is alert and oriented to person, place, and time.  Psychiatric: She has a normal mood and affect.  Vitals  reviewed.   Lab Results  Component Value Date   WBC 11.1 (H) 04/14/2016   HGB 13.8 04/14/2016   HCT 41.5 04/14/2016   PLT 257 04/14/2016   GLUCOSE 77 11/09/2016   CHOL 257 (H) 11/05/2016   TRIG 146.0 11/05/2016   HDL 55.90 11/05/2016   LDLDIRECT 78.0 06/10/2016   LDLCALC 172 (H) 11/05/2016   ALT 16 11/05/2016   AST 19 11/05/2016   NA 139 11/09/2016   K 4.6 11/09/2016   CL 100 11/09/2016   CREATININE 0.99 11/09/2016   BUN 19 11/09/2016   CO2 30 11/09/2016   TSH 2.28 11/05/2016   INR 0.96 04/14/2016   HGBA1C 5.8 09/13/2015    Assessment & Plan:   Problem List Items Addressed This Visit    Essential hypertension    Established problem, worsening. Adding Norvasc per PCPs prior recommendations.      Relevant Medications   amLODipine (NORVASC) 5 MG tablet      Meds ordered this encounter  Medications  . amLODipine (NORVASC) 5 MG tablet    Sig: Take 1 tablet (5 mg total) by mouth daily.    Dispense:  90 tablet    Refill:  3    Follow-up: 2 weeks with PCP  [place]

## 2017-02-22 ENCOUNTER — Ambulatory Visit: Payer: Medicare Other | Admitting: Family Medicine

## 2017-02-24 DIAGNOSIS — L03032 Cellulitis of left toe: Secondary | ICD-10-CM | POA: Diagnosis not present

## 2017-02-24 DIAGNOSIS — L600 Ingrowing nail: Secondary | ICD-10-CM | POA: Diagnosis not present

## 2017-03-03 NOTE — Telephone Encounter (Signed)
Pt's daughter Joycelyn Schmid) call regarding needing a new referral for Dr. Amil Amen at [facility]. She states that her mom has said that she is starting to have the same symptoms she had when she was diagnosed with PMR. Please advise. Shelly Coss [phone]

## 2017-03-09 DIAGNOSIS — G5601 Carpal tunnel syndrome, right upper limb: Secondary | ICD-10-CM | POA: Diagnosis not present

## 2017-03-09 DIAGNOSIS — Z8739 Personal history of other diseases of the musculoskeletal system and connective tissue: Secondary | ICD-10-CM | POA: Diagnosis not present

## 2017-03-11 NOTE — Telephone Encounter (Signed)
Please advise 

## 2017-03-12 NOTE — Telephone Encounter (Signed)
Patient needs to come see me first for evaluation before I refer, per rheumatology request. .   I will order labs that she can get done prior to her visit that will help [place] determine if the joit pain she is having is PMR vs OA

## 2017-03-16 NOTE — Telephone Encounter (Signed)
Spoke with pt and she stated that she went to see her hand doctor and they stated that she would need to see a rheumatologist because whatever she has going on in her hand is very complicated. The hand doctor referred the pt and she has an appt next week.

## 2017-03-17 DIAGNOSIS — Z6825 Body mass index (BMI) 25.0-25.9, adult: Secondary | ICD-10-CM | POA: Diagnosis not present

## 2017-03-17 DIAGNOSIS — L03032 Cellulitis of left toe: Secondary | ICD-10-CM | POA: Diagnosis not present

## 2017-03-17 DIAGNOSIS — Z8739 Personal history of other diseases of the musculoskeletal system and connective tissue: Secondary | ICD-10-CM | POA: Diagnosis not present

## 2017-03-17 DIAGNOSIS — R5383 Other fatigue: Secondary | ICD-10-CM | POA: Diagnosis not present

## 2017-03-17 DIAGNOSIS — M7989 Other specified soft tissue disorders: Secondary | ICD-10-CM | POA: Diagnosis not present

## 2017-03-17 DIAGNOSIS — M2550 Pain in unspecified joint: Secondary | ICD-10-CM | POA: Diagnosis not present

## 2017-03-17 DIAGNOSIS — E663 Overweight: Secondary | ICD-10-CM | POA: Diagnosis not present

## 2017-03-17 DIAGNOSIS — Z840 Family history of diseases of the skin and subcutaneous tissue: Secondary | ICD-10-CM | POA: Diagnosis not present

## 2017-03-23 DIAGNOSIS — G5601 Carpal tunnel syndrome, right upper limb: Secondary | ICD-10-CM | POA: Diagnosis not present

## 2017-03-23 DIAGNOSIS — Z8739 Personal history of other diseases of the musculoskeletal system and connective tissue: Secondary | ICD-10-CM | POA: Diagnosis not present

## 2017-03-31 DIAGNOSIS — Z6825 Body mass index (BMI) 25.0-25.9, adult: Secondary | ICD-10-CM | POA: Diagnosis not present

## 2017-03-31 DIAGNOSIS — E663 Overweight: Secondary | ICD-10-CM | POA: Diagnosis not present

## 2017-03-31 DIAGNOSIS — Z840 Family history of diseases of the skin and subcutaneous tissue: Secondary | ICD-10-CM | POA: Diagnosis not present

## 2017-03-31 DIAGNOSIS — M2550 Pain in unspecified joint: Secondary | ICD-10-CM | POA: Diagnosis not present

## 2017-03-31 DIAGNOSIS — R5383 Other fatigue: Secondary | ICD-10-CM | POA: Diagnosis not present

## 2017-03-31 DIAGNOSIS — Z8739 Personal history of other diseases of the musculoskeletal system and connective tissue: Secondary | ICD-10-CM | POA: Diagnosis not present

## 2017-03-31 DIAGNOSIS — R768 Other specified abnormal immunological findings in serum: Secondary | ICD-10-CM | POA: Diagnosis not present

## 2017-03-31 DIAGNOSIS — M810 Age-related osteoporosis without current pathological fracture: Secondary | ICD-10-CM | POA: Diagnosis not present

## 2017-03-31 DIAGNOSIS — M7989 Other specified soft tissue disorders: Secondary | ICD-10-CM | POA: Diagnosis not present

## 2017-04-02 ENCOUNTER — Other Ambulatory Visit: Payer: Self-pay | Admitting: Physician Assistant

## 2017-04-02 DIAGNOSIS — M810 Age-related osteoporosis without current pathological fracture: Secondary | ICD-10-CM

## 2017-04-14 ENCOUNTER — Encounter: Payer: Self-pay | Admitting: Family

## 2017-04-14 ENCOUNTER — Ambulatory Visit (INDEPENDENT_AMBULATORY_CARE_PROVIDER_SITE_OTHER): Payer: Medicare Other | Admitting: Family

## 2017-04-14 VITALS — BP 135/70 | HR 69 | Temp 97.9°F | Ht 62.5 in | Wt 141.2 lb

## 2017-04-14 DIAGNOSIS — K5900 Constipation, unspecified: Secondary | ICD-10-CM | POA: Diagnosis not present

## 2017-04-14 NOTE — Patient Instructions (Addendum)
Colonoscopy  Try the below and let me know if not better.    Constipation plan  1) Take Miralax once to twice per day until you have a bowel movement. You will end up titrating the use of Miralax. For example, you  may find that using the medication every other day or three times a week is a good bowel regimen for you. Or perhaps, twice weekly.   2)  If you do not get results with Miralax alone, you may then add Bisacodyl suppository daily to regimen until you get desired bowel results.  3) Take Colace ( stool softener) twice daily every day.   It is MOST important to drink LOTS of water and follow a HIGH fiber diet to keep foods moving through the gut. You may add Metamucil to a beverage that you drink.  Information on prevention of constipation as well as acute treatment for constipation as included below.  If there is no improvement in your symptoms, or if there is any worsening of symptoms, or if you have any additional concerns, please return to this clinic for re-evaluation; or, if we are closed, consider going to the Emergency Room for evaluation.    Constipation Prevention What is Constipation? Constipation is hard, dry bowel movements or the inability to have a bowel movement.  You can also feel like you need to have a bowel movement but not be able to.  It can also be painful when you strain to have a bowel movement.  Taking narcotic pain medicine after surgery can make you constipated, even if you have never had a problem with constipation. What Do I Need To Do? The best thing to do for constipation is to keep it from happening.  This can be done by: Adding laxatives to your daily routine, when taking prescription pain medicines after surgery. Add 17 gm Miralax daily or 100 mg Colace once or twice daily. (Miralax is mixed in water. Colace is a pill). They soften your bowel movements to make them easier to pass and hurt less. Drink plenty of water to help flush your bowels.   (Eight, 8 ounce glasses daily) Eat foods high in fiber such as whole grains, vegetables, cereals, fruits, and prune juice (5-7 servings a day or 25 grams).  If you do not know how much fiber a food has in it you can look on the label under dietary fiber.  If you have trouble getting enough fiber in your diet you may want to consider a fiber supplement such as Metamucil or Citrucel.  Also, be aware that eating fiber without drinking enough water can make constipation worse. If you do become constipated some medications that may help are: Bisacodyl (Dulcolax) is available in tablet form or a suppository. Glycerin suppositories are also a good choice if you need a fast acting medication. Everybody is different and may have different results.  Talk to your pharmacist or health care provider about your specific problems. They can help you choose the best product for you.  Why Is It Important for Me To Do This? Being constipated is not something you have to live with.  There are many things you can do to help.   Feeling bad can interfere with your recovery after surgery.  If constipation goes on for too long it can become a very serious medical problem. You may need to visit your doctor or go to the hospital.  That is why it is very important to drink lots of water,  eat enough fiber, and keep it from happening.  Ask Questions We want to answer all of your questions and concerns.  Thats why we encourage you to use a program called Ask Me 3, created by the [employer].  By using Ask Me 3 you are encouraged to ask 3 simple (yet, potentially life saving questions) whenever you are talking with your physician, nurse or pharmacist: What is my main problem? What do I need to do? Why is it important for me to do this? By understanding the answer to these three questions and any other questions you may have, you have the knowledge necessary to manage your health. Please feel very  comfortable asking any questions. Healthcare is complicated, so if you hear an answer you do not understand, please ask your health care team to explain again.   Sources: [employer] Med[employer] Plus 08-14-10 N

## 2017-04-14 NOTE — Assessment & Plan Note (Signed)
Patient is well-appearing and in no abdominal pain. Reassured by benign abdominal exam. Guaiac negative. However what is concerning is that patient describes pencil- like stools which is a change for her. We discussed at great length the importance of pursuing colonoscopy. Referral has been placed. Education provided on constipation plan as written on AVS.

## 2017-04-14 NOTE — Progress Notes (Signed)
Pre visit review using our clinic review tool, if applicable. No additional management support is needed unless otherwise documented below in the visit note. 

## 2017-04-14 NOTE — Progress Notes (Signed)
Subjective:    Patient ID: [name], female    DOB: [birth year], 83 y.o.   MRN: [id number]  CC: [name] is a 83 y.o. female who presents today for an acute visit.    HPI: CC: constipation x couple of weeks, waxing and waning.   Notes that bowel habits have changes and describes bowel movements are smaller. Describes stringy stool - finger size- and then other times normal. Last bm yesterday. No blood, hemorrhoids.   Has been taking laxatives- miralax , ducolax, prune which used to work and hasn't worked recently.   Notes that time constipated which coincides with starting amlodipine again.   H/o appendectomy      HISTORY:  Past Medical History:  Diagnosis Date  . Arthritis   . Carpal tunnel syndrome   . Gait abnormality    has for 42yr-no use of cane  . Hx of TIA (transient ischemic attack) and stroke   . Hypertension   . Hypothyroidism   . Osteoporosis   . Wears glasses   . Wears hearing aid    Past Surgical History:  Procedure Laterality Date  . APPENDECTOMY    . CARPAL TUNNEL RELEASE Left 07/11/2013   Procedure: CARPAL TUNNEL RELEASE;  Surgeon: Cammie Sickle., MD;  Location: [place];  Service: Orthopedics;  Laterality: Left;  . COLONOSCOPY    . SUBDURAL HEMATOMA EVACUATION VIA CRANIOTOMY  2007  . TONSILLECTOMY     Family History  Problem Relation Age of Onset  . Cancer Mother 70       breast ca  . Cancer Maternal Aunt   . Cancer Brother        pancreatic ca  . Breast cancer Sister   . Brain cancer Brother        astrocytoma?  . Kidney cancer Sister     Allergies: Pollen extract Current Outpatient Prescriptions on File Prior to Visit  Medication Sig Dispense Refill  . amLODipine (NORVASC) 5 MG tablet Take 1 tablet (5 mg total) by mouth daily. 90 tablet 3  . aspirin EC 81 MG tablet Take 81 mg by mouth every evening.    . cholecalciferol (VITAMIN D) 1000 units tablet Take 1,000 Units by mouth 2 (two) times daily.    [name]  levothyroxine (SYNTHROID, LEVOTHROID) 100 MCG tablet TAKE 1 TABLET BY MOUTH ONCE DAILY ON EMPTY STOMACH,WAIT 30 MINUTES BEFORE TAKING OTHER MEDS 30 tablet 9  . metoprolol tartrate (LOPRESSOR) 25 MG tablet TAKE 2 TABLETS BY MOUTH TWICE DAILY 120 tablet 5   No current facility-administered medications on file prior to visit.     Social History  Substance Use Topics  . Smoking status: Never Smoker  . Smokeless tobacco: Never Used  . Alcohol use Yes     Comment: occ    Review of Systems  Constitutional: Negative for chills and fever.  Respiratory: Negative for cough.   Cardiovascular: Negative for chest pain and palpitations.  Gastrointestinal: Positive for constipation. Negative for abdominal distention, abdominal pain, anal bleeding, blood in stool, nausea and vomiting.      Objective:    BP 135/70   Pulse 69   Temp 97.9 F (36.6 C) (Oral)   Ht 5' 2.5" (1.588 m)   Wt 141 lb 3.2 oz (64 kg)   SpO2 96%   BMI 25.41 kg/m    Physical Exam  Constitutional: She appears well-developed and well-nourished.  Eyes: Conjunctivae are normal.  Cardiovascular: Normal rate, regular rhythm, normal  heart sounds and normal pulses.   Pulmonary/Chest: Effort normal and breath sounds normal. She has no wheezes. She has no rhonchi. She has no rales.  Abdominal: Soft. Normal appearance and bowel sounds are normal. She exhibits no distension, no fluid wave, no ascites and no mass. There is no tenderness. There is no rigidity, no rebound, no guarding and no CVA tenderness.  Genitourinary: Rectal exam shows no external hemorrhoid, no internal hemorrhoid, no fissure, no tenderness and guaiac negative stool.  Neurological: She is alert.  Skin: Skin is warm and dry.  Psychiatric: She has a normal mood and affect. Her speech is normal and behavior is normal. Thought content normal.  Vitals reviewed.      Assessment & Plan:   Problem List Items Addressed This Visit      Digestive   Constipation -  Primary    Patient is well-appearing and in no abdominal pain. Reassured by benign abdominal exam. Guaiac negative. However what is concerning is that patient describes pencil- like stools which is a change for her. We discussed at great length the importance of pursuing colonoscopy. Referral has been placed. Education provided on constipation plan as written on AVS.      Relevant Orders   Ambulatory referral to Gastroenterology        I am having Ms. Willetts maintain her aspirin EC, metoprolol tartrate, cholecalciferol, levothyroxine, and amLODipine.   No orders of the defined types were placed in this encounter.   Return precautions given.   Risks, benefits, and alternatives of the medications and treatment plan prescribed today were discussed, and patient expressed understanding.   Education regarding symptom management and diagnosis given to patient on AVS.  Continue to follow with Crecencio Mc, MD for routine health maintenance.   [name] and I agreed with plan.   Mable Paris, FNP

## 2017-04-16 ENCOUNTER — Encounter: Payer: Self-pay | Admitting: Family

## 2017-04-19 ENCOUNTER — Encounter: Payer: Self-pay | Admitting: Family

## 2017-04-20 ENCOUNTER — Ambulatory Visit
Admission: RE | Admit: 2017-04-20 | Discharge: 2017-04-20 | Disposition: A | Discharge status: Home / Self Care | Payer: Medicare Other | Source: Ambulatory Visit | Attending: Physician Assistant | Admitting: Physician Assistant

## 2017-04-20 DIAGNOSIS — M810 Age-related osteoporosis without current pathological fracture: Secondary | ICD-10-CM | POA: Insufficient documentation

## 2017-04-20 DIAGNOSIS — M8588 Other specified disorders of bone density and structure, other site: Secondary | ICD-10-CM | POA: Insufficient documentation

## 2017-04-20 LAB — HM DEXA SCAN

## 2017-04-29 ENCOUNTER — Telehealth: Payer: Self-pay | Admitting: Internal Medicine

## 2017-04-29 NOTE — Telephone Encounter (Signed)
Pt lvm requesting someone rtc regarding her prolia injections. Her Rheumatologist is needing to know when her last one was and she believes it is time for her to get another one.  Pt cb [phone]

## 2017-04-29 NOTE — Telephone Encounter (Signed)
Noted thanks °

## 2017-04-29 NOTE — Telephone Encounter (Signed)
Patient has been informed that her last Prolia was [id number].  Patient Stated she is going to ask her rheumatologist if she can go ahead and get her next one due to the test they are going to run. She will return a callback to schedule the nurse visit at a later time to receive prolia injection.

## 2017-05-06 NOTE — Telephone Encounter (Signed)
Pt called back and scheduled for 7/18 @ 9 am.

## 2017-05-06 NOTE — Telephone Encounter (Signed)
I attempted to reach the patient to discuss and schedule, left a VM.

## 2017-05-12 ENCOUNTER — Ambulatory Visit (INDEPENDENT_AMBULATORY_CARE_PROVIDER_SITE_OTHER): Payer: Medicare Other | Admitting: *Deleted

## 2017-05-12 DIAGNOSIS — M818 Other osteoporosis without current pathological fracture: Secondary | ICD-10-CM | POA: Diagnosis not present

## 2017-05-12 DIAGNOSIS — M810 Age-related osteoporosis without current pathological fracture: Secondary | ICD-10-CM | POA: Diagnosis not present

## 2017-05-12 DIAGNOSIS — E663 Overweight: Secondary | ICD-10-CM | POA: Diagnosis not present

## 2017-05-12 DIAGNOSIS — Z6825 Body mass index (BMI) 25.0-25.9, adult: Secondary | ICD-10-CM | POA: Diagnosis not present

## 2017-05-12 DIAGNOSIS — T380X5A Adverse effect of glucocorticoids and synthetic analogues, initial encounter: Secondary | ICD-10-CM

## 2017-05-12 DIAGNOSIS — Z8739 Personal history of other diseases of the musculoskeletal system and connective tissue: Secondary | ICD-10-CM | POA: Diagnosis not present

## 2017-05-12 DIAGNOSIS — Z840 Family history of diseases of the skin and subcutaneous tissue: Secondary | ICD-10-CM | POA: Diagnosis not present

## 2017-05-12 DIAGNOSIS — R768 Other specified abnormal immunological findings in serum: Secondary | ICD-10-CM | POA: Diagnosis not present

## 2017-05-12 DIAGNOSIS — M7989 Other specified soft tissue disorders: Secondary | ICD-10-CM | POA: Diagnosis not present

## 2017-05-12 DIAGNOSIS — R5383 Other fatigue: Secondary | ICD-10-CM | POA: Diagnosis not present

## 2017-05-12 DIAGNOSIS — M2550 Pain in unspecified joint: Secondary | ICD-10-CM | POA: Diagnosis not present

## 2017-05-12 MED ORDER — DENOSUMAB 60 MG/ML ~~LOC~~ SOLN
60.0000 mg | Freq: Once | SUBCUTANEOUS | Status: AC
  Administered 2017-05-12: 60 mg via SUBCUTANEOUS

## 2017-05-12 NOTE — Progress Notes (Signed)
Patient presented for  6 month Prolia injection to left arm given [facility], patient voiced no concern nor voiced any distress during injection.

## 2017-05-21 ENCOUNTER — Telehealth: Payer: Self-pay | Admitting: Internal Medicine

## 2017-05-26 ENCOUNTER — Ambulatory Visit (INDEPENDENT_AMBULATORY_CARE_PROVIDER_SITE_OTHER): Payer: Medicare Other | Admitting: Internal Medicine

## 2017-05-26 ENCOUNTER — Encounter: Payer: Self-pay | Admitting: Internal Medicine

## 2017-05-26 DIAGNOSIS — T380X5A Adverse effect of glucocorticoids and synthetic analogues, initial encounter: Secondary | ICD-10-CM | POA: Diagnosis not present

## 2017-05-26 DIAGNOSIS — R4181 Age-related cognitive decline: Secondary | ICD-10-CM | POA: Diagnosis not present

## 2017-05-26 DIAGNOSIS — I10 Essential (primary) hypertension: Secondary | ICD-10-CM | POA: Diagnosis not present

## 2017-05-26 DIAGNOSIS — M818 Other osteoporosis without current pathological fracture: Secondary | ICD-10-CM | POA: Diagnosis not present

## 2017-05-26 DIAGNOSIS — K5900 Constipation, unspecified: Secondary | ICD-10-CM | POA: Diagnosis not present

## 2017-05-26 MED ORDER — LOSARTAN POTASSIUM 25 MG PO TABS: 25.0000 mg | ORAL_TABLET | Freq: Every day | ORAL | 1 refills | Status: DC

## 2017-05-26 NOTE — Progress Notes (Signed)
Subjective:  Patient ID: [name], female    DOB: [birth year]  Age: 83 y.o. MRN: [id number]  CC: Diagnoses of Essential hypertension, Constipation, unspecified constipation type, Age-related cognitive decline, and Steroid-induced osteoporosis were pertinent to this visit.  HPI [name] presents for follow up on PMR, constipation hypothyroidisma nd hypertension  Saw a hand surgeon  For right wrist pain and numbness in fingers , ordered rheumatology eval and  EMG/[place] studies which were positive for CTS but surgery was not advised.   Rheumatology has started her on prednisone 5 mg in June for a month,   For polyarthralgia., then 5 mg  alternating with 4 mg ([facility]).  The pain has improved.  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  Says she had  jaundice as a child.  LFTS normal in January and historically. Had labs at rheumatology in June.   Hypertension:    Tolerating amlodipine  But getting constipated from it. No improvement despite miralax bid.  NP ordered a colonoscopy.  Has a normal BM but frequently  Has a pencil gauge stool on a regular basis   Seen in January,  ESR was 5 then  Feels that he rmemory getting worse slowly.   Used to see neurologist [name]  .  Still drives,.  No accidents  ,  Not getting lost .  Has withdrawn from comittees at church.  Uses a weekly pill box to manage medications/   Received Prolia i in July .  T scores unchanged  By June 2018 study 2 falls while working in the yard and hiking on a trail during the last year,  No fractures.   Outpatient Medications Prior to Visit  Medication Sig Dispense Refill  . aspirin EC 81 MG tablet Take 81 mg by mouth every evening.    . cholecalciferol (VITAMIN D) 1000 units tablet Take 1,000 Units by mouth 2 (two) times daily.    [name] levothyroxine (SYNTHROID, LEVOTHROID) 100 MCG tablet TAKE 1 TABLET BY MOUTH ONCE DAILY ON EMPTY STOMACH,WAIT 30 MINUTES BEFORE TAKING OTHER MEDS 30 tablet 9  . metoprolol tartrate  (LOPRESSOR) 25 MG tablet TAKE 2 TABLETS BY MOUTH TWICE DAILY 120 tablet 5  . amLODipine (NORVASC) 5 MG tablet Take 1 tablet (5 mg total) by mouth daily. 90 tablet 3   No facility-administered medications prior to visit.     Review of Systems;  Patient denies headache, fevers, malaise, unintentional weight loss, skin rash, eye pain, sinus congestion and sinus pain, sore throat, dysphagia,  hemoptysis , cough, dyspnea, wheezing, chest pain, palpitations, orthopnea, edema, abdominal pain, nausea, melena, diarrhea, constipation, flank pain, dysuria, hematuria, urinary  Frequency, nocturia, numbness, tingling, seizures,  Focal weakness, Loss of consciousness,  Tremor, insomnia, depression, anxiety, and suicidal ideation.      Objective:  BP 138/66 (BP Location: Left Arm, Patient Position: Sitting, Cuff Size: Normal)   Pulse 67   Temp 97.8 F (36.6 C) (Oral)   Resp 15   Ht 5' 2.5" (1.588 m)   Wt 143 lb 6.4 oz (65 kg)   SpO2 97%   BMI 25.81 kg/m   BP Readings from Last 3 Encounters:  05/26/17 138/66  04/14/17 135/70  02/19/17 (!) 162/84    Wt Readings from Last 3 Encounters:  05/26/17 143 lb 6.4 oz (65 kg)  04/14/17 141 lb 3.2 oz (64 kg)  02/19/17 144 lb 2 oz (65.4 kg)    General appearance: alert, cooperative and appears stated age Ears: normal TM's  and external ear canals both ears Throat: lips, mucosa, and tongue normal; teeth and gums normal Neck: no adenopathy, no carotid bruit, supple, symmetrical, trachea midline and thyroid not enlarged, symmetric, no tenderness/mass/nodules Back: symmetric, no curvature. ROM normal. No CVA tenderness. Lungs: clear to auscultation bilaterally Heart: regular rate and rhythm, S1, S2 normal, no murmur, click, rub or gallop Abdomen: soft, non-tender; bowel sounds normal; no masses,  no organomegaly Pulses: 2+ and symmetric Skin: Skin color, texture, turgor normal. No rashes or lesions Lymph nodes: Cervical, supraclavicular, and axillary  nodes normal.  Lab Results  Component Value Date   HGBA1C 5.8 09/13/2015   HGBA1C 6.0 03/05/2015    Lab Results  Component Value Date   CREATININE 0.99 11/09/2016   CREATININE 0.92 11/05/2016   CREATININE 0.86 06/10/2016    Lab Results  Component Value Date   WBC 11.1 (H) 04/14/2016   HGB 13.8 04/14/2016   HCT 41.5 04/14/2016   PLT 257 04/14/2016   GLUCOSE 77 11/09/2016   CHOL 257 (H) 11/05/2016   TRIG 146.0 11/05/2016   HDL 55.90 11/05/2016   LDLDIRECT 78.0 06/10/2016   LDLCALC 172 (H) 11/05/2016   ALT 16 11/05/2016   AST 19 11/05/2016   NA 139 11/09/2016   K 4.6 11/09/2016   CL 100 11/09/2016   CREATININE 0.99 11/09/2016   BUN 19 11/09/2016   CO2 30 11/09/2016   TSH 2.28 11/05/2016   INR 0.96 04/14/2016   HGBA1C 5.8 09/13/2015    Dg Bone Density (dxa)  Result Date: 04/20/2017 EXAM: DUAL X-RAY ABSORPTIOMETRY (DXA) FOR BONE MINERAL DENSITY IMPRESSION: Dear Dr Marella Chimes, Your patient [name] completed a BMD test on 04/20/2017 using the [place] (analysis version: 14.10) manufactured by [employer]. The following summarizes the results of our evaluation. PATIENT BIOGRAPHICAL: Name: Vargas, Mary Patient ID: [id number] Birth Date: July 17, 1927 Height: 62.5 in. Gender: Female Exam Date: 04/20/2017 Weight: 140.8 lbs. Indications: Advanced Age, Caucasian, Family Hx of Osteoporosis, Low Body Weight, Osteoporotic, POSTmenopausal, Rheumatoid Arthritis Fractures: Treatments: 81 MG ASPIRIN, levothyroxin, prolia, Vitamin D ASSESSMENT: The BMD measured at Femur Neck Left is 0.677 g/cm2 with a T-score of -2.6. This patient is considered OSTEOPOROTIC according to [facility]) criteria. L1 was excluded due to degenerative changes. Patient is not a candidate for FRAX assessment due to osteoporosis report and Prolia use. Site Region Measured Measured WHO Young Adult BMD Date       Age      Classification T-score DualFemur Neck Left [age]  88.0 Osteoporosis -2.6 0.677 g/cm2 AP Spine L2-L4 04/20/2017 88.0 Osteopenia -1.9 0.976 g/cm2 World Health Organization [facility]) criteria for post-menopausal, Caucasian Women: Normal:       T-score at or above -1 SD Osteopenia:   T-score between -1 and -2.5 SD Osteoporosis: T-score at or below -2.5 SD RECOMMENDATIONS: [place] recommends that FDA-approved medical therapies be considered in postemenopausal women and men age 61 or older with a: 1. Hip or vertebral (clinical or morphometric) fracture. 2. T-score of < -2.5at the spine or hip. 3. Ten-year fracture probability by FRAX of 3% or greater for hip fracture or 20% or greater for major osteoporotic fracture. All treatment decisions require clinical judgment and consideration of individual patient factors, including patient preferences, co-morbidities, previous drug use, risk factors not captured in the FRAX model (e.g. falls, vitamin D deficiency, increased bone turnover, interval significant decline in bone density) and possible under - or over-estimation of fracture risk by FRAX. All  patients should ensure an adequate intake of dietary calcium (1200 mg/d) and vitamin D (800 IU daily) unless contraindicated. FOLLOW-UP: People with diagnosed cases of osteoporosis or at high risk for fracture should have regular bone mineral density tests. For patients eligible for Medicare, routine testing is allowed once every 2 years. The testing frequency can be increased to one year for patients who have rapidly progressing disease, those who are receiving or discontinuing medical therapy to restore bone mass, or have additional risk factors. I have reviewed this report, and agree with the above findings. Mark A. Thornton Papas, M.D. [facility] Radiology Electronically Signed   By: Lavonia Dana M.D.   On: 04/20/2017 10:05    Assessment & Plan:   Problem List Items Addressed This Visit    Steroid-induced osteoporosis    Managed with Prolia . T scores are  stable,  She has had 2 falls this year with no fractures       Essential hypertension    Suspending amlodipine due to persistent constipation.  Trial of losartan.        Relevant Medications   losartan (COZAAR) 25 MG tablet   Constipation    Patient believes it to be amlodipine induced.  Will change medication to losartan       Age-related cognitive decline    She is managing her affairs well but is concerned about her short term memory  Reassurance provided that her cognitive function is stable        A total of 25 minutes of face to face time was spent with patient more than half of which was spent in counselling about the above mentioned conditions  and coordination of care   I have discontinued Ms. Scribner's amLODipine. I am also having her start on losartan. Additionally, I am having her maintain her aspirin EC, metoprolol tartrate, cholecalciferol, levothyroxine, predniSONE, and predniSONE.  Meds ordered this encounter  Medications  . predniSONE (DELTASONE) 1 MG tablet    Sig: Take 4 mg by mouth every other day.   . predniSONE (DELTASONE) 5 MG tablet    Sig: 5 mg every other day.   . losartan (COZAAR) 25 MG tablet    Sig: Take 1 tablet (25 mg total) by mouth daily.    Dispense:  90 tablet    Refill:  1    Medications Discontinued During This Encounter  Medication Reason  . amLODipine (NORVASC) 5 MG tablet     Follow-up: Return in about 6 months (around 11/26/2017).   Crecencio Mc, MD

## 2017-05-26 NOTE — Assessment & Plan Note (Signed)
Suspending amlodipine due to persistent constipation.  Trial of losartan.

## 2017-05-26 NOTE — Patient Instructions (Addendum)
I am changing your amlodipine to losartan to see if the constipation improves  Start with 25 mg daily of losartan .  Increase to 50 mg daily if your blood pressure is not below 140/90 after one week.

## 2017-05-27 NOTE — Assessment & Plan Note (Signed)
She is managing her affairs well but is concerned about her short term memory  Reassurance provided that her cognitive function is stable

## 2017-05-27 NOTE — Assessment & Plan Note (Signed)
Managed with Prolia . T scores are stable,  She has had 2 falls this year with no fractures

## 2017-05-27 NOTE — Assessment & Plan Note (Signed)
Patient believes it to be amlodipine induced.  Will change medication to losartan

## 2017-06-04 ENCOUNTER — Other Ambulatory Visit: Payer: Self-pay | Admitting: Internal Medicine

## 2017-06-07 ENCOUNTER — Encounter: Payer: Self-pay | Admitting: Gastroenterology

## 2017-06-07 ENCOUNTER — Ambulatory Visit (INDEPENDENT_AMBULATORY_CARE_PROVIDER_SITE_OTHER): Payer: Medicare Other | Admitting: Gastroenterology

## 2017-06-07 VITALS — BP 135/62 | HR 75 | Temp 98.2°F | Ht 60.5 in | Wt 143.0 lb

## 2017-06-07 DIAGNOSIS — R194 Change in bowel habit: Secondary | ICD-10-CM

## 2017-06-07 DIAGNOSIS — K5900 Constipation, unspecified: Secondary | ICD-10-CM

## 2017-06-07 NOTE — Progress Notes (Signed)
Gastroenterology Consultation  Referring Provider:     Burnard Hawthorne, FNP Primary Care Physician:  Crecencio Mc, MD Primary Gastroenterologist:  Dr. Allen Norris     Reason for Consultation:     Change in bowel habits        HPI:   [name] is a 83 y.o. y/o female referred for consultation & management of Change in bowel habits by Dr. Derrel Nip, Aris Everts, MD.  This patient comes in today with a history of change in bowel habits. The patient states that it was related to her amlodipine and when she stopped this medication she reports that her bowel habits have been much better. She also reports that she has had a change in the stool caliber but this is intermittent. There is no report of any unexplained weight loss fevers chills nausea or vomiting. The patient also reports that she has not had a colonoscopy many years. She wanted to talk today because she did not know if she should be worried about her change in bowel habits. She denies any family history of colon cancer colon polyps. The patient also reports that she's had some cognitive impairment although she denies any dementia or symptoms of Alzheimer's.  Past Medical History:  Diagnosis Date  . Arthritis   . Carpal tunnel syndrome   . Gait abnormality    has for 39yr-no use of cane  . Hx of TIA (transient ischemic attack) and stroke   . Hypertension   . Hypothyroidism   . Osteoporosis   . Wears glasses   . Wears hearing aid     Past Surgical History:  Procedure Laterality Date  . APPENDECTOMY    . CARPAL TUNNEL RELEASE Left 07/11/2013   Procedure: CARPAL TUNNEL RELEASE;  Surgeon: Cammie Sickle., MD;  Location: [place];  Service: Orthopedics;  Laterality: Left;  . COLONOSCOPY    . SUBDURAL HEMATOMA EVACUATION VIA CRANIOTOMY  2007  . TONSILLECTOMY      Prior to Admission medications   Medication Sig Start Date End Date Taking? Authorizing Provider  aspirin EC 81 MG tablet Take 81 mg by mouth every  evening.   Yes [provider]  cholecalciferol (VITAMIN D) 1000 units tablet Take 1,000 Units by mouth 2 (two) times daily.   Yes [provider]  levothyroxine (SYNTHROID, LEVOTHROID) 100 MCG tablet TAKE 1 TABLET BY MOUTH ONCE DAILY ON EMPTY STOMACH,WAIT 30 MINUTES BEFORE TAKING OTHER MEDS 12/30/16  Yes Crecencio Mc, MD  losartan (COZAAR) 25 MG tablet Take 1 tablet (25 mg total) by mouth daily. 05/26/17  Yes Crecencio Mc, MD  metoprolol tartrate (LOPRESSOR) 25 MG tablet TAKE 2 TABLETS BY MOUTH TWICE DAILY 06/04/17  Yes Crecencio Mc, MD  predniSONE (DELTASONE) 1 MG tablet Take 4 mg by mouth every other day.  05/12/17  Yes [provider]  predniSONE (DELTASONE) 5 MG tablet 5 mg every other day.  05/01/17  Yes [provider]    Family History  Problem Relation Age of Onset  . Cancer Mother 32       breast ca  . Cancer Maternal Aunt   . Cancer Brother        pancreatic ca  . Breast cancer Sister   . Brain cancer Brother        astrocytoma?  . Kidney cancer Sister      Social History  Substance Use Topics  . Smoking status: Never Smoker  .  Smokeless tobacco: Never Used  . Alcohol use Yes     Comment: occ    Allergies as of 06/07/2017 - Review Complete 06/07/2017  Allergen Reaction Noted  . Pollen extract      Review of Systems:    All systems reviewed and negative except where noted in HPI.   Physical Exam:  BP 135/62   Pulse 75   Temp 98.2 F (36.8 C) (Oral)   Ht 5' 0.5" (1.537 m)   Wt 143 lb (64.9 kg)   BMI 27.47 kg/m  No LMP recorded. Patient is postmenopausal. Psych:  Alert and cooperative. Normal mood and affect. General:   Alert,  Well-developed, well-nourished, pleasant and cooperative in NAD Head:  Normocephalic and atraumatic. Eyes:  Sclera clear, no icterus.   Conjunctiva pink. Ears:  Normal auditory acuity. Nose:  No deformity, discharge, or lesions. Mouth:  No deformity or lesions,oropharynx pink & moist. Neck:   Supple; no masses or thyromegaly. Lungs:  Respirations even and unlabored.  Clear throughout to auscultation.   No wheezes, crackles, or rhonchi. No acute distress. Heart:  Regular rate and rhythm; no murmurs, clicks, rubs, or gallops. Abdomen:  Normal bowel sounds.  No bruits.  Soft, non-tender and non-distended without masses, hepatosplenomegaly or hernias noted.  No guarding or rebound tenderness.  Negative Carnett sign.   Rectal:  Deferred.  Msk:  Symmetrical without gross deformities.  Good, equal movement & strength bilaterally. Pulses:  Normal pulses noted. Extremities:  No clubbing or edema.  No cyanosis. Neurologic:  Alert and oriented x3;  grossly normal neurologically. Skin:  Intact without significant lesions or rashes.  No jaundice. Lymph Nodes:  No significant cervical adenopathy. Psych:  Alert and cooperative. Normal mood and affect.  Imaging Studies: No results found.  Assessment and Plan:   Ixchel Duck Vargas is a 83 y.o. y/o female who comes in with a history of constipation that improved after she stopped taking her blood pressure medication. The patient reports that she is not really open to undergoing any procedures. She states that because her symptoms were so long standing it is unlikely in her mind to be anything worrisome. The patient has been told that if she is not wanting to be aggressive with treatment that doing a colonoscopy would be a risk without benefit but if she wanted to be aggressive and undergo colonoscopy we could always set it up. The patient has taken my card and states that if she has any further issues or if she decides to undergo a colonoscopy she will contact me.  Lucilla Lame, MD. [facility]   Note: This dictation was prepared with Dragon dictation along with smaller phrase technology. Any transcriptional errors that result from this process are unintentional.

## 2017-07-05 ENCOUNTER — Other Ambulatory Visit: Payer: Self-pay | Admitting: Internal Medicine

## 2017-07-13 DIAGNOSIS — M7989 Other specified soft tissue disorders: Secondary | ICD-10-CM | POA: Diagnosis not present

## 2017-07-13 DIAGNOSIS — M0600 Rheumatoid arthritis without rheumatoid factor, unspecified site: Secondary | ICD-10-CM | POA: Diagnosis not present

## 2017-07-13 DIAGNOSIS — Z6825 Body mass index (BMI) 25.0-25.9, adult: Secondary | ICD-10-CM | POA: Diagnosis not present

## 2017-07-13 DIAGNOSIS — M810 Age-related osteoporosis without current pathological fracture: Secondary | ICD-10-CM | POA: Diagnosis not present

## 2017-07-13 DIAGNOSIS — E663 Overweight: Secondary | ICD-10-CM | POA: Diagnosis not present

## 2017-07-13 DIAGNOSIS — R768 Other specified abnormal immunological findings in serum: Secondary | ICD-10-CM | POA: Diagnosis not present

## 2017-07-13 DIAGNOSIS — Z840 Family history of diseases of the skin and subcutaneous tissue: Secondary | ICD-10-CM | POA: Diagnosis not present

## 2017-07-13 DIAGNOSIS — Z8739 Personal history of other diseases of the musculoskeletal system and connective tissue: Secondary | ICD-10-CM | POA: Diagnosis not present

## 2017-07-23 DIAGNOSIS — Z23 Encounter for immunization: Secondary | ICD-10-CM | POA: Diagnosis not present

## 2017-08-19 ENCOUNTER — Ambulatory Visit: Payer: Medicare Other | Admitting: Urology

## 2017-08-30 ENCOUNTER — Other Ambulatory Visit: Payer: Self-pay | Admitting: Internal Medicine

## 2017-09-13 DIAGNOSIS — E663 Overweight: Secondary | ICD-10-CM | POA: Diagnosis not present

## 2017-09-13 DIAGNOSIS — Z840 Family history of diseases of the skin and subcutaneous tissue: Secondary | ICD-10-CM | POA: Diagnosis not present

## 2017-09-13 DIAGNOSIS — M0600 Rheumatoid arthritis without rheumatoid factor, unspecified site: Secondary | ICD-10-CM | POA: Diagnosis not present

## 2017-09-13 DIAGNOSIS — Z6826 Body mass index (BMI) 26.0-26.9, adult: Secondary | ICD-10-CM | POA: Diagnosis not present

## 2017-09-13 DIAGNOSIS — Z8739 Personal history of other diseases of the musculoskeletal system and connective tissue: Secondary | ICD-10-CM | POA: Diagnosis not present

## 2017-09-13 DIAGNOSIS — M810 Age-related osteoporosis without current pathological fracture: Secondary | ICD-10-CM | POA: Diagnosis not present

## 2017-09-13 DIAGNOSIS — R768 Other specified abnormal immunological findings in serum: Secondary | ICD-10-CM | POA: Diagnosis not present

## 2017-09-14 ENCOUNTER — Encounter: Payer: Self-pay | Admitting: Internal Medicine

## 2017-09-14 ENCOUNTER — Other Ambulatory Visit: Payer: Self-pay | Admitting: Internal Medicine

## 2017-09-14 MED ORDER — LOSARTAN POTASSIUM 50 MG PO TABS: 50.0000 mg | ORAL_TABLET | Freq: Every day | ORAL | 0 refills | Status: DC

## 2017-09-20 ENCOUNTER — Telehealth: Payer: Self-pay | Admitting: Internal Medicine

## 2017-09-20 NOTE — Telephone Encounter (Signed)
Patient is aware 

## 2017-09-20 NOTE — Telephone Encounter (Signed)
Patient was told per E-mail to increase BP medication due to elevated BP readings. Patient increased the Losartan daily adding a dose at mid day. Patient has been monitoring BP and during the day and she reports elevations mostly late in the day around 6:00pm. It was 155/78 this morning before morning dose. Patient states the medication doesn't seem to be making a difference. Patient is not having symptoms. Best number to reach patient- is home number. Patient states she was told to come in if she could not get numbers down- does she need an appointment?  Other readings: 193/87 174/98 187/98 181/100 179/96 178/87- all afternoon/ night

## 2017-09-20 NOTE — Telephone Encounter (Signed)
She has barely waited a week for the medication change.  She can in increase losartan to 50 mg twice daily and if BP not at goal in one week,  Make appt

## 2017-09-20 NOTE — Telephone Encounter (Signed)
Do you want me to call and schedule patient?

## 2017-09-21 ENCOUNTER — Other Ambulatory Visit: Payer: Medicare Other

## 2017-09-22 ENCOUNTER — Ambulatory Visit (INDEPENDENT_AMBULATORY_CARE_PROVIDER_SITE_OTHER): Payer: Medicare Other

## 2017-09-22 ENCOUNTER — Other Ambulatory Visit: Payer: Self-pay

## 2017-09-22 ENCOUNTER — Other Ambulatory Visit (INDEPENDENT_AMBULATORY_CARE_PROVIDER_SITE_OTHER): Payer: Medicare Other

## 2017-09-22 VITALS — BP 140/70 | HR 70 | Temp 97.7°F | Ht 63.0 in | Wt 146.6 lb

## 2017-09-22 DIAGNOSIS — H6123 Impacted cerumen, bilateral: Secondary | ICD-10-CM | POA: Diagnosis not present

## 2017-09-22 DIAGNOSIS — E039 Hypothyroidism, unspecified: Secondary | ICD-10-CM

## 2017-09-22 DIAGNOSIS — M353 Polymyalgia rheumatica: Secondary | ICD-10-CM | POA: Diagnosis not present

## 2017-09-22 DIAGNOSIS — Z0000 Encounter for general adult medical examination without abnormal findings: Secondary | ICD-10-CM

## 2017-09-22 DIAGNOSIS — Z79899 Other long term (current) drug therapy: Secondary | ICD-10-CM

## 2017-09-22 DIAGNOSIS — M25562 Pain in left knee: Secondary | ICD-10-CM | POA: Diagnosis not present

## 2017-09-22 DIAGNOSIS — H903 Sensorineural hearing loss, bilateral: Secondary | ICD-10-CM | POA: Diagnosis not present

## 2017-09-22 DIAGNOSIS — Z1331 Encounter for screening for depression: Secondary | ICD-10-CM | POA: Diagnosis not present

## 2017-09-22 LAB — COMPREHENSIVE METABOLIC PANEL
ALK PHOS: 53 U/L (ref 39–117)
ALT: 16 U/L (ref 0–35)
AST: 18 U/L (ref 0–37)
Albumin: 4.1 g/dL (ref 3.5–5.2)
BUN: 21 mg/dL (ref 6–23)
CHLORIDE: 101 meq/L (ref 96–112)
CO2: 30 meq/L (ref 19–32)
Calcium: 9.4 mg/dL (ref 8.4–10.5)
Creatinine, Ser: 0.86 mg/dL (ref 0.40–1.20)
GFR: 66.12 mL/min (ref >60.00)
GLUCOSE: 115 mg/dL — AB (ref 70–99)
POTASSIUM: 4.7 meq/L (ref 3.5–5.1)
SODIUM: 137 meq/L (ref 135–145)
TOTAL PROTEIN: 7.1 g/dL (ref 6.0–8.3)
Total Bilirubin: 0.7 mg/dL (ref 0.2–1.2)

## 2017-09-22 LAB — CBC WITH DIFFERENTIAL/PLATELET
Basophils Absolute: 0.1 10*3/uL (ref 0.0–0.1)
Basophils Relative: 1 % (ref 0.0–3.0)
EOS PCT: 3.1 % (ref 0.0–5.0)
Eosinophils Absolute: 0.2 10*3/uL (ref 0.0–0.7)
HCT: 42.2 % (ref 36.0–46.0)
Hemoglobin: 13.9 g/dL (ref 12.0–15.0)
LYMPHS ABS: 1.9 10*3/uL (ref 0.7–4.0)
Lymphocytes Relative: 24.4 % (ref 12.0–46.0)
MCHC: 32.9 g/dL (ref 30.0–36.0)
MCV: 94.5 fl (ref 78.0–100.0)
MONOS PCT: 7.6 % (ref 3.0–12.0)
Monocytes Absolute: 0.6 10*3/uL (ref 0.1–1.0)
NEUTROS PCT: 63.9 % (ref 43.0–77.0)
Neutro Abs: 5 10*3/uL (ref 1.4–7.7)
Platelets: 265 10*3/uL (ref 150.0–400.0)
RBC: 4.47 Mil/uL (ref 3.87–5.11)
RDW: 13 % (ref 11.5–15.5)
WBC: 7.8 10*3/uL (ref 4.0–10.5)

## 2017-09-22 LAB — C-REACTIVE PROTEIN: CRP: 0.3 mg/dL — AB (ref 0.5–20.0)

## 2017-09-22 LAB — TSH: TSH: 2.23 u[IU]/mL (ref 0.35–4.50)

## 2017-09-22 LAB — SEDIMENTATION RATE: SED RATE: 3 mm/h (ref 0–30)

## 2017-09-22 NOTE — Progress Notes (Signed)
Subjective:   [name] is a 83 y.o. female who presents for Medicare Annual (Subsequent) preventive examination.  Review of Systems:  No ROS.  Medicare Wellness Visit. Additional risk factors are reflected in the social history.  Cardiac Risk Factors include: advanced age (>24men, >4 women);hypertension     Objective:     Vitals: BP 140/70   Pulse 70   Temp 97.7 F (36.5 C) (Oral)   Ht 5\' 3"  (1.6 m)   Wt 146 lb 9.6 oz (66.5 kg)   SpO2 98%   BMI 25.97 kg/m   Body mass index is 25.97 kg/m.   Tobacco Social History   Tobacco Use  Smoking Status Never Smoker  Smokeless Tobacco Never Used     Counseling given: Not Answered   Past Medical History:  Diagnosis Date  . Arthritis   . Carpal tunnel syndrome   . 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 Hx of TIA (transient ischemic attack) and stroke   . Hypertension   . Hypothyroidism   . Osteoporosis   . Wears glasses   . Wears hearing aid    Past Surgical History:  Procedure Laterality Date  . APPENDECTOMY    . CARPAL TUNNEL RELEASE Left 07/11/2013   Procedure: CARPAL TUNNEL RELEASE;  Surgeon: Cammie Sickle., MD;  Location: [facility];  Service: Orthopedics;  Laterality: Left;  . COLONOSCOPY    . SUBDURAL HEMATOMA EVACUATION VIA CRANIOTOMY  2007  . TONSILLECTOMY     Family History  Problem Relation Age of Onset  . Cancer Mother 69       breast ca  . Cancer Maternal Aunt   . Cancer Brother        pancreatic ca  . Breast cancer Sister   . Brain cancer Brother        astrocytoma?  . Kidney cancer Sister    Social History   Substance and Sexual Activity  Sexual Activity Not on file    Outpatient Encounter Medications as of 09/22/2017  Medication Sig  . aspirin EC 81 MG tablet Take 81 mg by mouth every evening.  . cholecalciferol (VITAMIN D) 1000 units tablet Take 1,000 Units by mouth 2 (two) times daily.  [name] FOLIC ACID PO Take by mouth.  . levothyroxine  (SYNTHROID, LEVOTHROID) 100 MCG tablet TAKE 1 TABLET BY MOUTH ONCE DAILY ON EMPTY STOMACH,WAIT 30 MINUTES BEFORE TAKING OTHER MEDS  . losartan (COZAAR) 50 MG tablet Take 1 tablet (50 mg total) daily by mouth. (Patient taking differently: Take 100 mg by mouth daily. )  . Methotrexate Sodium (TREXALL PO) Take by mouth.  . metoprolol tartrate (LOPRESSOR) 25 MG tablet TAKE 2 TABLETS BY MOUTH TWICE DAILY  . predniSONE (DELTASONE) 1 MG tablet Take 4 mg by mouth every other day.   . [DISCONTINUED] predniSONE (DELTASONE) 5 MG tablet 5 mg every other day.    No facility-administered encounter medications on file as of 09/22/2017.     Activities of Daily Living In your present state of health, do you have any difficulty performing the following activities: 09/22/2017 09/22/2016  Hearing? Y Y  Comment HOH, wears hearing aids -  Vision? N N  Difficulty concentrating or making decisions? N Y  Comment - Difficulty remembering names.  Memory delay.  Age related cognitive decline.  Walking or climbing stairs? Y Y  Comment Unsteady gait Intermittent L knee pain  Dressing or bathing? N N  Doing errands,  shopping? N N  Preparing Food and eating ? N N  Using the Toilet? N N  In the past six months, have you accidently leaked urine? N N  Do you have problems with loss of bowel control? N N  Managing your Medications? N N  Managing your Finances? N N  Housekeeping or managing your Housekeeping? N N  Some recent data might be hidden    Patient Care Team: Crecencio Mc, MD as PCP - General (Internal Medicine)    Assessment:    This is a routine wellness examination for [name]. The goal of the wellness visit is to assist the patient how to close the gaps in care and create a preventative care plan for the patient.   The roster of all physicians providing medical care to patient is listed in the Snapshot section of the chart.  Taking calcium VIT D as appropriate/Osteoporosis risk reviewed.     Safety issues reviewed; Smoke and carbon monoxide detectors in the home. No firearms in the home.  Wears seatbelts when driving or riding with others. Patient does wear sunscreen or protective clothing when in direct sunlight. No violence in the home.  Depression- PHQ 2 &9 complete.  No signs/symptoms or verbal communication regarding little pleasure in doing things, feeling down, depressed or hopeless. No changes in sleeping, energy, eating, concentrating.  No thoughts of self harm or harm towards others.  Time spent on this topic is 10 minutes.   Patient is alert, normal appearance, oriented to person/place/and time.   No new identified risk were noted.  No failures at ADL's or IADL's.    BMI- discussed the importance of a healthy diet, water intake and the benefits of aerobic exercise. Educational material provided.   Daily fluid intake: 1 cups of caffeine, 4 cups of water.  Dental- every 6 months.  Eye- Visual acuity not assessed per patient preference since they have regular follow up with the ophthalmologist.  Wears corrective lenses.  Sleep patterns- Sleeps 5-6 hours at night.  Wakes feeling rested.  Labs completed today.  Health maintenance gaps- closed.  Patient Concerns: None at this time. Follow up with PCP as needed.  Exercise Activities and Dietary recommendations Current Exercise Habits: Home exercise routine, Type of exercise: walking, Intensity: Mild  Goals    . Increase physical activity     Walk in the gym daily, 20 minutes Track fitbit to minimum of 5,000 steps per day    . INCREASE WATER INTAKE     Stay hydrated      Fall Risk Fall Risk  09/22/2017 04/14/2017 09/22/2016 06/23/2016  Falls in the past year? No No No No  Comment - - - Emmi Telephone Survey: data to providers prior to load   Depression Screen PHQ 2/9 Scores 09/22/2017 04/14/2017 09/22/2016  PHQ - 2 Score 0 0 0  PHQ- 9 Score 0 - -     Cognitive Function MMSE - Mini Mental State  Exam 09/22/2017  Not completed: Refused     6CIT Screen 09/22/2016  What Year? 0 points  What month? 0 points  What time? 0 points  Count back from 20 0 points  Months in reverse 0 points  Repeat phrase 0 points  Total Score 0    Immunization History  Administered Date(s) Administered  . Influenza, High Dose Seasonal PF 07/25/2015  . Influenza-Unspecified 08/07/2014, 07/27/2016  . Pneumococcal Conjugate-13 09/07/2014  . Pneumococcal Polysaccharide-23 09/07/2005, 09/07/2010  . Tdap 11/29/2015  . Zoster 01/20/2005  Screening Tests Health Maintenance  Topic Date Due  . TETANUS/TDAP  11/28/2025  . INFLUENZA VACCINE  Completed  . DEXA SCAN  Completed  . PNA vac Low Risk Adult  Completed      Plan:    End of life planning; Advance aging; Advanced directives discussed. Copy of current HCPOA/Living Will requested.     I have personally reviewed and noted the following in the patient's chart:   . Medical and social history . Use of alcohol, tobacco or illicit drugs  . Current medications and supplements . Functional ability and status . Nutritional status . Physical activity . Advanced directives . List of other physicians . Hospitalizations, surgeries, and ER visits in previous 12 months . Vitals . Screenings to include cognitive, depression, and falls . Referrals and appointments  In addition, I have reviewed and discussed with patient certain preventive protocols, quality metrics, and best practice recommendations. A written personalized care plan for preventive services as well as general preventive health recommendations were provided to patient.     OBrien-Blaney, Todrick Siedschlag L, LPN  [id number]    I have reviewed the above information and agree with above.   Deborra Medina, MD

## 2017-09-22 NOTE — Patient Instructions (Addendum)
  [name] , Thank you for taking time to come for your Medicare Wellness Visit. I appreciate your ongoing commitment to your health goals. Please review the following plan we discussed and let me know if I can assist you in the future.   Follow up with Dr. Derrel Nip as needed.    Bring a copy of your [place] and/or Living Will to be scanned into chart.  Have a great day!  These are the goals we discussed: Goals    . Increase physical activity     Walk in the gym daily, 20 minutes Track fitbit to minimum of 5,000 steps per day    . INCREASE WATER INTAKE     Stay hydrated       This is a list of the screening recommended for you and due dates:  Health Maintenance  Topic Date Due  . Tetanus Vaccine  11/28/2025  . Flu Shot  Completed  . DEXA scan (bone density measurement)  Completed  . Pneumonia vaccines  Completed

## 2017-09-23 LAB — RHEUMATOID FACTOR

## 2017-09-23 LAB — ANTI-NUCLEAR AB-TITER (ANA TITER): ANA Titer 1: 1:160 {titer} — ABNORMAL HIGH

## 2017-09-23 LAB — ANA: Anti Nuclear Antibody(ANA): POSITIVE — AB

## 2017-09-26 ENCOUNTER — Encounter: Payer: Self-pay | Admitting: Internal Medicine

## 2017-10-20 ENCOUNTER — Ambulatory Visit: Payer: Medicare Other | Admitting: Gastroenterology

## 2017-10-20 ENCOUNTER — Other Ambulatory Visit: Payer: Self-pay | Admitting: Internal Medicine

## 2017-10-22 ENCOUNTER — Telehealth: Payer: Self-pay | Admitting: Internal Medicine

## 2017-10-22 ENCOUNTER — Other Ambulatory Visit: Payer: Self-pay | Admitting: *Deleted

## 2017-10-22 ENCOUNTER — Ambulatory Visit: Payer: Self-pay | Admitting: *Deleted

## 2017-10-22 MED ORDER — LOSARTAN POTASSIUM 50 MG PO TABS: 50.0000 mg | ORAL_TABLET | Freq: Two times a day (BID) | ORAL | 0 refills | Status: DC

## 2017-10-22 NOTE — Telephone Encounter (Signed)
Pt  Denies   Any  Symptoms    Has  Ran out  Of  Her  Medications  Last  Pm  Attempted  To make an  Appointment  With a   Provider   Pt  Reports  Recent  medication  Changes   In   Nov   And  Pt    May  Have     Not been    Enough   Medications    3   Way  conferenced   With  Kerin Salen  At  Dr  Demetrios Isaacs  Office  For  Medication  Adjustment      Reason for Disposition . Systolic BP  >= 032 OR Diastolic >= 122  Answer Assessment - Initial Assessment Questions 1. BLOOD PRESSURE: "What is the blood pressure?" "Did you take at least two measurements 5 minutes apart?"   Bp  140/74  2. ONSET: "When did you take your blood pressure?"     6  Am    3. HOW: "How did you obtain the blood pressure?" (e.g., visiting nurse, automatic home BP monitor)       Home   4. HISTORY: "Do you have a history of high blood pressure?"     Yes  5. MEDICATIONS: "Are you taking any medications for blood pressure?" "Have you missed any doses recently?"     Ran  Out  Today  6. OTHER SYMPTOMS: "Do you have any symptoms?" (e.g., headache, chest pain, blurred vision, difficulty breathing, weakness)     NO 7. PREGNANCY: "Is there any chance you are pregnant?" "When was your last menstrual period?"     N/A  Protocols used: HIGH BLOOD PRESSURE-A-AH

## 2017-10-22 NOTE — Telephone Encounter (Signed)
Spoke with patient on phone from [facility] patient stated BP still above goal at times running 165/80, 175/90, ask patient dosage she was taking she stated she is taking losartan 50 mg daily PCP last note stated to take 50 MG losartan twice daily if BP still not at goal. Refilled losartan at 50 mg twice daily? Per note dated .09/20/17.

## 2017-10-22 NOTE — Telephone Encounter (Signed)
Agree,  She should  Be taking the losartan total daily dose of 100 mg.  She can divide it into 2 doses  if she tolerates it better

## 2017-10-22 NOTE — Telephone Encounter (Signed)
Patient notified and voiced understanding.

## 2017-11-01 ENCOUNTER — Ambulatory Visit (INDEPENDENT_AMBULATORY_CARE_PROVIDER_SITE_OTHER): Payer: Medicare Other | Admitting: Gastroenterology

## 2017-11-01 ENCOUNTER — Encounter: Payer: Self-pay | Admitting: Gastroenterology

## 2017-11-01 VITALS — BP 160/71 | HR 70 | Ht 62.5 in | Wt 148.5 lb

## 2017-11-01 DIAGNOSIS — R194 Change in bowel habit: Secondary | ICD-10-CM

## 2017-11-01 NOTE — Progress Notes (Signed)
Primary Care Physician: Crecencio Mc, MD  Primary Gastroenterologist:  Dr. Lucilla Lame  No chief complaint on file.   HPI: [name] is a 84 y.o. female here with a report of continued change in bowel habits. The patient reports that she very rarely has constipation at this time but will have episodes of diarrhea.  The patient states that since she has lost some of her mental capacity she is concerned that if she needs to be taking care of her looser bowel movements may pose a problem for any person who may be her caregiver.  The patient had been offered a colonoscopy in the past and stated that she did not want to go through with it at that time because she was constipated and thought it was from her amiodarone.  The patient now reports that her diarrhea concomitants intermittently with out symptoms for a few days to weeks and then it starts again.  She does report drinking milk with her cereal and she states that she lives ice cream although has not eaten a lot of it recently.  The patient also has artificial sweeteners with her coffee.  Current Outpatient Medications  Medication Sig Dispense Refill  . aspirin EC 81 MG tablet Take 81 mg by mouth every evening.    . cholecalciferol (VITAMIN D) 1000 units tablet Take 1,000 Units by mouth 2 (two) times daily.    [name] FOLIC ACID PO Take by mouth.    . levothyroxine (SYNTHROID, LEVOTHROID) 100 MCG tablet TAKE 1 TABLET BY MOUTH ONCE DAILY ON EMPTY STOMACH,WAIT 30 MINUTES BEFORE TAKING OTHER MEDS 30 tablet 9  . losartan (COZAAR) 50 MG tablet Take 1 tablet (50 mg total) by mouth 2 (two) times daily. 180 tablet 0  . Methotrexate Sodium (TREXALL PO) Take by mouth.    . metoprolol tartrate (LOPRESSOR) 25 MG tablet TAKE 2 TABLETS BY MOUTH TWICE DAILY 120 tablet 1  . predniSONE (DELTASONE) 1 MG tablet Take 4 mg by mouth every other day.      No current facility-administered medications for this visit.     Allergies as of 11/01/2017 - Review  Complete 09/22/2017  Allergen Reaction Noted  . Pollen extract      ROS:  General: Negative for anorexia, weight loss, fever, chills, fatigue, weakness. ENT: Negative for hoarseness, difficulty swallowing , nasal congestion. CV: Negative for chest pain, angina, palpitations, dyspnea on exertion, peripheral edema.  Respiratory: Negative for dyspnea at rest, dyspnea on exertion, cough, sputum, wheezing.  GI: See history of present illness. GU:  Negative for dysuria, hematuria, urinary incontinence, urinary frequency, nocturnal urination.  Endo: Negative for unusual weight change.    Physical Examination:   There were no vitals taken for this visit.  General: Well-nourished, well-developed in no acute distress.  Eyes: No icterus. Conjunctivae pink. Neuro: Alert and oriented x 3.  Grossly intact. Skin: Warm and dry, no jaundice.   Psych: Alert and cooperative, normal mood and affect.  Labs:    Imaging Studies: No results found.  Assessment and Plan:   [name] is a 84 y.o. y/o female who has had a change in bowel habits with her last visit reporting constipation in this visit reporting diarrhea. The patient has made it clear that she does not want to undergo a colonoscopy and would like some alternative treatments for the diarrhea.  The patient has been told that since it happened so intermittently it may be something she is being exposed to  like in her diet.  She has been told to try to avoid milk products and dairy products to see if that improves her symptoms.  The patient has also been told that if that does not improve her symptoms that she should try to take supplemental fiber with Citrucel once or twice a day.  The patient states that she will try this and let me know if she changes her mind and wants to undergo a colonoscopy.  The patient has been explained the plan and agrees with it.    Lucilla Lame, MD. Marval Regal   Note: This dictation was prepared with Dragon dictation  along with smaller phrase technology. Any transcriptional errors that result from this process are unintentional.

## 2017-11-02 ENCOUNTER — Other Ambulatory Visit: Payer: Self-pay | Admitting: Internal Medicine

## 2017-11-10 DIAGNOSIS — M810 Age-related osteoporosis without current pathological fracture: Secondary | ICD-10-CM | POA: Diagnosis not present

## 2017-11-10 DIAGNOSIS — M0600 Rheumatoid arthritis without rheumatoid factor, unspecified site: Secondary | ICD-10-CM | POA: Diagnosis not present

## 2017-11-10 DIAGNOSIS — Z840 Family history of diseases of the skin and subcutaneous tissue: Secondary | ICD-10-CM | POA: Diagnosis not present

## 2017-11-10 DIAGNOSIS — E663 Overweight: Secondary | ICD-10-CM | POA: Diagnosis not present

## 2017-11-10 DIAGNOSIS — R768 Other specified abnormal immunological findings in serum: Secondary | ICD-10-CM | POA: Diagnosis not present

## 2017-11-10 DIAGNOSIS — Z6826 Body mass index (BMI) 26.0-26.9, adult: Secondary | ICD-10-CM | POA: Diagnosis not present

## 2017-11-10 DIAGNOSIS — Z8739 Personal history of other diseases of the musculoskeletal system and connective tissue: Secondary | ICD-10-CM | POA: Diagnosis not present

## 2017-11-30 ENCOUNTER — Ambulatory Visit (INDEPENDENT_AMBULATORY_CARE_PROVIDER_SITE_OTHER): Payer: Medicare Other | Admitting: Internal Medicine

## 2017-11-30 ENCOUNTER — Encounter: Payer: Self-pay | Admitting: Internal Medicine

## 2017-11-30 VITALS — BP 134/68 | HR 75 | Temp 97.6°F | Resp 15 | Ht 62.5 in | Wt 148.6 lb

## 2017-11-30 DIAGNOSIS — T380X5A Adverse effect of glucocorticoids and synthetic analogues, initial encounter: Secondary | ICD-10-CM | POA: Diagnosis not present

## 2017-11-30 DIAGNOSIS — E039 Hypothyroidism, unspecified: Secondary | ICD-10-CM | POA: Insufficient documentation

## 2017-11-30 DIAGNOSIS — Z79899 Other long term (current) drug therapy: Secondary | ICD-10-CM

## 2017-11-30 DIAGNOSIS — M818 Other osteoporosis without current pathological fracture: Secondary | ICD-10-CM | POA: Diagnosis not present

## 2017-11-30 DIAGNOSIS — I10 Essential (primary) hypertension: Secondary | ICD-10-CM

## 2017-11-30 DIAGNOSIS — E559 Vitamin D deficiency, unspecified: Secondary | ICD-10-CM | POA: Diagnosis not present

## 2017-11-30 DIAGNOSIS — M0600 Rheumatoid arthritis without rheumatoid factor, unspecified site: Secondary | ICD-10-CM | POA: Diagnosis not present

## 2017-11-30 MED ORDER — DENOSUMAB 60 MG/ML ~~LOC~~ SOLN
60.0000 mg | Freq: Once | SUBCUTANEOUS | Status: AC
  Administered 2017-11-30: 60 mg via SUBCUTANEOUS

## 2017-11-30 MED ORDER — LOSARTAN POTASSIUM 100 MG PO TABS: 100.0000 mg | ORAL_TABLET | Freq: Every day | ORAL | 0 refills | Status: DC

## 2017-11-30 MED ORDER — METOPROLOL SUCCINATE ER 50 MG PO TB24: 50.0000 mg | ORAL_TABLET | Freq: Every day | ORAL | 3 refills | Status: DC

## 2017-11-30 NOTE — Assessment & Plan Note (Signed)
Currently managed by Rheum with  MTX  And prednisone.

## 2017-11-30 NOTE — Assessment & Plan Note (Signed)
Cbc and cmet are beinch monitored by rheumatology  Lab Results  Component Value Date   WBC 7.8 09/22/2017   HGB 13.9 09/22/2017   HCT 42.2 09/22/2017   MCV 94.5 09/22/2017   PLT 265.0 09/22/2017   Lab Results  Component Value Date   ALT 16 09/22/2017   AST 18 09/22/2017   ALKPHOS 53 09/22/2017   BILITOT 0.7 09/22/2017

## 2017-11-30 NOTE — Assessment & Plan Note (Signed)
Managed with Prolia . T scores are stable,  She has had 2 falls this year with no fractures .  Prolia given today

## 2017-11-30 NOTE — Patient Instructions (Addendum)
I have changed your losartan to once daily dosing of 100 mg  For your convenience.  I recommend that you take it in the morning  I have changed your metoprolol to once daily dose 50 mg take in the evening after supper ( your  evening meal)   If your blood pressure is dropping below 120/70,  You can cut the metoprolol dose in half.   We should recheck your thyroid in 4 months along with your Vitamin D

## 2017-11-30 NOTE — Assessment & Plan Note (Signed)
Advised to take losartan 100 mg daily  And metoprolol succinate 50 mg daily

## 2017-11-30 NOTE — Progress Notes (Signed)
Subjective:  Patient ID: [name], female    DOB: April 09, 1927  Age: 84 y.o. MRN: [id number]  CC: The primary encounter diagnosis was Vitamin D deficiency. Diagnoses of Acquired hypothyroidism, Steroid-induced osteoporosis, Seronegative rheumatoid arthritis ([place]), Long-term use of high-risk medication, and Essential hypertension were also pertinent to this visit.  HPI [name] presents for 6 month follow up on hypertension, hypothyroidism .  Patient states that she is unsteady on her feet today due to fatigue. She worked really hard yesterday cleaning her house.  She is afraid of falling, but  denies any falls in the last 6 months. She lives in a one story house. But has 3 steps with a railing as the entrance into the house.  She has a walki in shower,  But prefers to take a bath daily.  She has a safety rail in each , and ne also near the toilet .  She is able to rise from seated position without assistance or use of hands/arms   Getting treated for seronegative rheumatoid arthritis  By Rheumatology after her orthopedist  Deferred treatment of her right hand  Index trigger finger until seen.  Current treatment includes 3 mg prednisone,  MTX and folic acid.   3 mg dose of prednisone appears to be the minimum dose advocated by rheumatoligst at [place]  [facility]   Osteoporosis by DEXA,  T socres  -2.6  Getting  Prolia,  Next dose was Due Jan 18th (last one July).  To be given today   Hypertension: patient checks blood pressure twice weekly at home.  Readings have been for the most part < 140/80 at rest . Patient is following a reduce salt diet most days and is taking medications as prescribed  Outpatient Medications Prior to Visit  Medication Sig Dispense Refill  . aspirin EC 81 MG tablet Take 81 mg by mouth every evening.    . cholecalciferol (VITAMIN D) 1000 units tablet Take 1,000 Units by mouth 2 (two) times daily.    [name] FOLIC ACID PO Take by mouth.    . levothyroxine (SYNTHROID,  LEVOTHROID) 100 MCG tablet TAKE 1 TABLET BY MOUTH ONCE DAILY ON EMPTY STOMACH,WAIT 30 MINUTES BEFORE TAKING OTHER MEDS 30 tablet 9  . Methotrexate Sodium (TREXALL PO) Take by mouth.    . predniSONE (DELTASONE) 1 MG tablet Take 3 mg by mouth every other day.     . losartan (COZAAR) 50 MG tablet Take 1 tablet (50 mg total) by mouth 2 (two) times daily. 180 tablet 0  . metoprolol tartrate (LOPRESSOR) 25 MG tablet TAKE 2 TABLETS BY MOUTH TWICE DAILY 120 tablet 1   No facility-administered medications prior to visit.     Review of Systems;  Patient denies headache, fevers, malaise, unintentional weight loss, skin rash, eye pain, sinus congestion and sinus pain, sore throat, dysphagia,  hemoptysis , cough, dyspnea, wheezing, chest pain, palpitations, orthopnea, edema, abdominal pain, nausea, melena, diarrhea, constipation, flank pain, dysuria, hematuria, urinary  Frequency, nocturia, numbness, tingling, seizures,  Focal weakness, Loss of consciousness,  Tremor, insomnia, depression, anxiety, and suicidal ideation.      Objective:  BP 134/68 (BP Location: Left Arm, Patient Position: Sitting, Cuff Size: Normal)   Pulse 75   Temp 97.6 F (36.4 C) (Oral)   Resp 15   Ht 5' 2.5" (1.588 m)   Wt 148 lb 9.6 oz (67.4 kg)   SpO2 94%   BMI 26.75 kg/m   BP Readings from Last 3 Encounters:  11/30/17 134/68  11/02/17 (!) 160/71  09/22/17 140/70    Wt Readings from Last 3 Encounters:  11/30/17 148 lb 9.6 oz (67.4 kg)  11/02/17 148 lb 8 oz (67.4 kg)  09/22/17 146 lb 9.6 oz (66.5 kg)    General appearance: alert, cooperative and appears stated age Ears: normal TM's and external ear canals both ears Throat: lips, mucosa, and tongue normal; teeth and gums normal Neck: no adenopathy, no carotid bruit, supple, symmetrical, trachea midline and thyroid not enlarged, symmetric, no tenderness/mass/nodules Back: symmetric, no curvature. ROM normal. No CVA tenderness. Lungs: clear to auscultation  bilaterally Heart: regular rate and rhythm, S1, S2 normal, no murmur, click, rub or gallop Abdomen: soft, non-tender; bowel sounds normal; no masses,  no organomegaly Pulses: 2+ and symmetric Skin: Skin color, texture, turgor normal. No rashes or lesions Lymph nodes: Cervical, supraclavicular, and axillary nodes normal.  Lab Results  Component Value Date   HGBA1C 5.8 09/13/2015   HGBA1C 6.0 03/05/2015    Lab Results  Component Value Date   CREATININE 0.86 09/22/2017   CREATININE 0.99 11/09/2016   CREATININE 0.92 11/05/2016    Lab Results  Component Value Date   WBC 7.8 09/22/2017   HGB 13.9 09/22/2017   HCT 42.2 09/22/2017   PLT 265.0 09/22/2017   GLUCOSE 115 (H) 09/22/2017   CHOL 257 (H) 11/05/2016   TRIG 146.0 11/05/2016   HDL 55.90 11/05/2016   LDLDIRECT 78.0 06/10/2016   LDLCALC 172 (H) 11/05/2016   ALT 16 09/22/2017   AST 18 09/22/2017   NA 137 09/22/2017   K 4.7 09/22/2017   CL 101 09/22/2017   CREATININE 0.86 09/22/2017   BUN 21 09/22/2017   CO2 30 09/22/2017   TSH 2.23 09/22/2017   INR 0.96 04/14/2016   HGBA1C 5.8 09/13/2015    Dg Bone Density (dxa)  Result Date: 04/20/2017 EXAM: DUAL X-RAY ABSORPTIOMETRY (DXA) FOR BONE MINERAL DENSITY IMPRESSION: Dear Dr Marella Chimes, Your patient [name] completed a BMD test on 04/20/2017 using the [place] (analysis version: 14.10) manufactured by [employer]. The following summarizes the results of our evaluation. PATIENT BIOGRAPHICAL: Name: Mary, Vargas Patient ID: [id number] Birth Date: [birth year] Height: 62.5 in. Gender: Female Exam Date: 04/20/2017 Weight: 140.8 lbs. Indications: Advanced Age, Caucasian, Family Hx of Osteoporosis, Low Body Weight, Osteoporotic, POSTmenopausal, Rheumatoid Arthritis Fractures: Treatments: 81 MG ASPIRIN, levothyroxin, prolia, Vitamin D ASSESSMENT: The BMD measured at Femur Neck Left is 0.677 g/cm2 with a T-score of -2.6. This patient is considered OSTEOPOROTIC  according to [facility]) criteria. L1 was excluded due to degenerative changes. Patient is not a candidate for FRAX assessment due to osteoporosis report and Prolia use. Site Region Measured Measured WHO Young Adult BMD Date       Age      Classification T-score DualFemur Neck Left 04/20/2017 88.0 Osteoporosis -2.6 0.677 g/cm2 AP Spine L2-L4 04/20/2017 88.0 Osteopenia -1.9 0.976 g/cm2 World Health Organization [facility]) criteria for post-menopausal, Caucasian Women: Normal:       T-score at or above -1 SD Osteopenia:   T-score between -1 and -2.5 SD Osteoporosis: T-score at or below -2.5 SD RECOMMENDATIONS: [place] recommends that FDA-approved medical therapies be considered in postemenopausal women and men age 39 or older with a: 1. Hip or vertebral (clinical or morphometric) fracture. 2. T-score of < -2.5at the spine or hip. 3. Ten-year fracture probability by FRAX of 3% or greater for hip fracture or 20% or greater for  major osteoporotic fracture. All treatment decisions require clinical judgment and consideration of individual patient factors, including patient preferences, co-morbidities, previous drug use, risk factors not captured in the FRAX model (e.g. falls, vitamin D deficiency, increased bone turnover, interval significant decline in bone density) and possible under - or over-estimation of fracture risk by FRAX. All patients should ensure an adequate intake of dietary calcium (1200 mg/d) and vitamin D (800 IU daily) unless contraindicated. FOLLOW-UP: People with diagnosed cases of osteoporosis or at high risk for fracture should have regular bone mineral density tests. For patients eligible for Medicare, routine testing is allowed once every 2 years. The testing frequency can be increased to one year for patients who have rapidly progressing disease, those who are receiving or discontinuing medical therapy to restore bone mass, or have additional risk factors. I  have reviewed this report, and agree with the above findings. Mark A. Thornton Papas, M.D. [facility] Electronically Signed   By: Lavonia Dana M.D.   On: 04/20/2017 10:05    Assessment & Plan:   Problem List Items Addressed This Visit    Vitamin D deficiency - Primary   Relevant Orders   VITAMIN D 25 Hydroxy (Vit-D Deficiency, Fractures)   Steroid-induced osteoporosis    Managed with Prolia . T scores are stable,  She has had 2 falls this year with no fractures .  Prolia given today       Relevant Medications   denosumab (PROLIA) injection 60 mg (Completed)   Seronegative rheumatoid arthritis ([place])    Currently managed by Rheum with  MTX  And prednisone.      Long-term use of high-risk medication    Cbc and cmet are beinch monitored by rheumatology  Lab Results  Component Value Date   WBC 7.8 09/22/2017   HGB 13.9 09/22/2017   HCT 42.2 09/22/2017   MCV 94.5 09/22/2017   PLT 265.0 09/22/2017   Lab Results  Component Value Date   ALT 16 09/22/2017   AST 18 09/22/2017   ALKPHOS 53 09/22/2017   BILITOT 0.7 09/22/2017         Essential hypertension    Advised to take losartan 100 mg daily  And metoprolol succinate 50 mg daily       Relevant Medications   losartan (COZAAR) 100 MG tablet   metoprolol succinate (TOPROL-XL) 50 MG 24 hr tablet   Acquired hypothyroidism   Relevant Medications   metoprolol succinate (TOPROL-XL) 50 MG 24 hr tablet   Other Relevant Orders   TSH     A total of 25 minutes of face to face time was spent with patient more than half of which was spent in  counselling about the above mentioned conditions  and coordination of care  I have discontinued [name]'s metoprolol tartrate. I have also changed her losartan. Additionally, I am having her start on metoprolol succinate. Lastly, I am having her maintain her aspirin EC, cholecalciferol, levothyroxine, predniSONE, Methotrexate Sodium (TREXALL PO), and FOLIC ACID PO. We administered  denosumab.  Meds ordered this encounter  Medications  . losartan (COZAAR) 100 MG tablet    Sig: Take 1 tablet (100 mg total) by mouth daily.    Dispense:  90 tablet    Refill:  0  . metoprolol succinate (TOPROL-XL) 50 MG 24 hr tablet    Sig: Take 1 tablet (50 mg total) by mouth daily. Take with or immediately following evening meal.    Dispense:  90 tablet    Refill:  3  . denosumab (PROLIA) injection 60 mg    Medications Discontinued During This Encounter  Medication Reason  . metoprolol tartrate (LOPRESSOR) 25 MG tablet   . losartan (COZAAR) 50 MG tablet     Follow-up: Return in about 6 months (around 05/30/2018) for 4 months for nonfasting labs .   Crecencio Mc, MD

## 2017-12-08 ENCOUNTER — Other Ambulatory Visit: Payer: Self-pay | Admitting: Internal Medicine

## 2018-01-10 DIAGNOSIS — R768 Other specified abnormal immunological findings in serum: Secondary | ICD-10-CM | POA: Diagnosis not present

## 2018-01-10 DIAGNOSIS — E559 Vitamin D deficiency, unspecified: Secondary | ICD-10-CM | POA: Diagnosis not present

## 2018-01-10 DIAGNOSIS — M0600 Rheumatoid arthritis without rheumatoid factor, unspecified site: Secondary | ICD-10-CM | POA: Diagnosis not present

## 2018-01-10 DIAGNOSIS — Z6826 Body mass index (BMI) 26.0-26.9, adult: Secondary | ICD-10-CM | POA: Diagnosis not present

## 2018-01-10 DIAGNOSIS — E663 Overweight: Secondary | ICD-10-CM | POA: Diagnosis not present

## 2018-01-10 DIAGNOSIS — Z8739 Personal history of other diseases of the musculoskeletal system and connective tissue: Secondary | ICD-10-CM | POA: Diagnosis not present

## 2018-01-10 DIAGNOSIS — Z840 Family history of diseases of the skin and subcutaneous tissue: Secondary | ICD-10-CM | POA: Diagnosis not present

## 2018-01-10 DIAGNOSIS — M810 Age-related osteoporosis without current pathological fracture: Secondary | ICD-10-CM | POA: Diagnosis not present

## 2018-02-06 ENCOUNTER — Other Ambulatory Visit: Payer: Self-pay | Admitting: Internal Medicine

## 2018-03-09 DIAGNOSIS — M65341 Trigger finger, right ring finger: Secondary | ICD-10-CM | POA: Diagnosis not present

## 2018-03-25 DIAGNOSIS — M65341 Trigger finger, right ring finger: Secondary | ICD-10-CM | POA: Diagnosis not present

## 2018-03-30 ENCOUNTER — Other Ambulatory Visit (INDEPENDENT_AMBULATORY_CARE_PROVIDER_SITE_OTHER): Payer: Medicare Other

## 2018-03-30 DIAGNOSIS — E559 Vitamin D deficiency, unspecified: Secondary | ICD-10-CM

## 2018-03-30 DIAGNOSIS — E039 Hypothyroidism, unspecified: Secondary | ICD-10-CM | POA: Diagnosis not present

## 2018-03-30 LAB — TSH: TSH: 0.78 u[IU]/mL (ref 0.35–4.50)

## 2018-03-30 LAB — VITAMIN D 25 HYDROXY (VIT D DEFICIENCY, FRACTURES): VITD: 35.81 ng/mL (ref 30.00–100.00)

## 2018-04-11 DIAGNOSIS — H1031 Unspecified acute conjunctivitis, right eye: Secondary | ICD-10-CM | POA: Diagnosis not present

## 2018-04-11 DIAGNOSIS — J22 Unspecified acute lower respiratory infection: Secondary | ICD-10-CM | POA: Diagnosis not present

## 2018-04-11 DIAGNOSIS — R05 Cough: Secondary | ICD-10-CM | POA: Diagnosis not present

## 2018-04-25 ENCOUNTER — Other Ambulatory Visit: Payer: Self-pay | Admitting: Internal Medicine

## 2018-04-25 ENCOUNTER — Ambulatory Visit (INDEPENDENT_AMBULATORY_CARE_PROVIDER_SITE_OTHER): Payer: Medicare Other | Admitting: Internal Medicine

## 2018-04-25 ENCOUNTER — Encounter: Payer: Self-pay | Admitting: Internal Medicine

## 2018-04-25 VITALS — BP 124/68 | HR 86 | Temp 97.9°F | Resp 16 | Ht 62.5 in | Wt 143.8 lb

## 2018-04-25 DIAGNOSIS — R9389 Abnormal findings on diagnostic imaging of other specified body structures: Secondary | ICD-10-CM

## 2018-04-25 DIAGNOSIS — J181 Lobar pneumonia, unspecified organism: Secondary | ICD-10-CM | POA: Diagnosis not present

## 2018-04-25 DIAGNOSIS — J189 Pneumonia, unspecified organism: Secondary | ICD-10-CM

## 2018-04-25 NOTE — Patient Instructions (Addendum)
For your allergies ,  You can use Benadryl at bedtime but you should also consider adding one of these newer second generation antihistamines that are longer acting, non sedating and  available OTC:  Generic  Zyrtec, which is cetirizine.    generic Allegra , available generically as fexofenadine ; comes in 60 mg and 180 mg once daily strengths.    Generic Claritin :  also available as loratidine .    For the post nasal drip that is causing your throat clearing ,  You can try You should try [employer]'s Sinus rinse ;  It is a strong sinus "flush" using water and medicated salts.  Do it over the sink because it can be a bit messy

## 2018-04-25 NOTE — Progress Notes (Signed)
Subjective:  Patient ID: [name], female    DOB: Mar 06, 1927  Age: 84 y.o. MRN: [id number]  CC: The primary encounter diagnosis was Abnormal chest x-ray. A diagnosis of Community acquired pneumonia of left lower lobe of lung ([place]) was also pertinent to this visit.  HPI [name] presents for  Follow u pon recent treatment for lower lobe pneumonia June 17 by [facility]   Has felt sinus congestion for several weeks.  Does not take anything for allergies.    Did not tolerate levaquin due to nocturnal agitation , was given azithromycin by the PA at Urgent Care . tolelrated the cheratussin  .  Cough and malaise has resolved.  Held the MTX for 2 weeks per rheumatology   Lab Results  Component Value Date   TSH 0.78 03/30/2018     Outpatient Medications Prior to Visit  Medication Sig Dispense Refill  . aspirin EC 81 MG tablet Take 81 mg by mouth every evening.    . cholecalciferol (VITAMIN D) 1000 units tablet Take 1,000 Units by mouth 2 (two) times daily.    [name] FOLIC ACID PO Take by mouth.    . levothyroxine (SYNTHROID, LEVOTHROID) 100 MCG tablet TAKE 1 TABLET BY MOUTH ONCE DAILY ON AN EMPTY STOMACH. WAIT 30 MINUTES BEFORE TAKING OTHER MEDS. 90 tablet 1  . losartan (COZAAR) 100 MG tablet TAKE 1 TABLET BY MOUTH ONCE DAILY 90 tablet 1  . Methotrexate Sodium (TREXALL PO) Take by mouth.    . metoprolol succinate (TOPROL-XL) 50 MG 24 hr tablet Take 1 tablet (50 mg total) by mouth daily. Take with or immediately following evening meal. 90 tablet 3  . predniSONE (DELTASONE) 1 MG tablet Take 3 mg by mouth every other day.     . levothyroxine (SYNTHROID, LEVOTHROID) 100 MCG tablet TAKE 1 TABLET BY MOUTH ONCE DAILY ON EMPTY STOMACH,WAIT 30 MINUTES BEFORE TAKING OTHER MEDS (Patient not taking: Reported on 04/25/2018) 30 tablet 9   No facility-administered medications prior to visit.     Review of Systems;  Patient denies headache, fevers, malaise, unintentional weight loss, skin  rash, eye pain, sinus congestion and sinus pain, sore throat, dysphagia,  hemoptysis , cough, dyspnea, wheezing, chest pain, palpitations, orthopnea, edema, abdominal pain, nausea, melena, diarrhea, constipation, flank pain, dysuria, hematuria, urinary  Frequency, nocturia, numbness, tingling, seizures,  Focal weakness, Loss of consciousness,  Tremor, insomnia, depression, anxiety, and suicidal ideation.      Objective:  BP 124/68 (BP Location: Left Arm, Patient Position: Sitting, Cuff Size: Normal)   Pulse 86   Temp 97.9 F (36.6 C) (Oral)   Resp 16   Ht 5' 2.5" (1.588 m)   Wt 143 lb 12.8 oz (65.2 kg)   SpO2 90%   BMI 25.88 kg/m   BP Readings from Last 3 Encounters:  04/25/18 124/68  11/30/17 134/68  11/02/17 (!) 160/71    Wt Readings from Last 3 Encounters:  04/25/18 143 lb 12.8 oz (65.2 kg)  11/30/17 148 lb 9.6 oz (67.4 kg)  11/02/17 148 lb 8 oz (67.4 kg)    General appearance: alert, cooperative and appears stated age Ears: normal TM's and external ear canals both ears Throat: lips, mucosa, and tongue normal; teeth and gums normal Neck: no adenopathy, no carotid bruit, supple, symmetrical, trachea midline and thyroid not enlarged, symmetric, no tenderness/mass/nodules Back: symmetric, no curvature. ROM normal. No CVA tenderness. Lungs: clear to auscultation bilaterally Heart: regular rate and rhythm, S1, S2 normal, no murmur, click,  rub or gallop Abdomen: soft, non-tender; bowel sounds normal; no masses,  no organomegaly Pulses: 2+ and symmetric Skin: Skin color, texture, turgor normal. No rashes or lesions Lymph nodes: Cervical, supraclavicular, and axillary nodes normal.  Lab Results  Component Value Date   HGBA1C 5.8 09/13/2015   HGBA1C 6.0 03/05/2015    Lab Results  Component Value Date   CREATININE 0.86 09/22/2017   CREATININE 0.99 11/09/2016   CREATININE 0.92 11/05/2016    Lab Results  Component Value Date   WBC 7.8 09/22/2017   HGB 13.9 09/22/2017    HCT 42.2 09/22/2017   PLT 265.0 09/22/2017   GLUCOSE 115 (H) 09/22/2017   CHOL 257 (H) 11/05/2016   TRIG 146.0 11/05/2016   HDL 55.90 11/05/2016   LDLDIRECT 78.0 06/10/2016   LDLCALC 172 (H) 11/05/2016   ALT 16 09/22/2017   AST 18 09/22/2017   NA 137 09/22/2017   K 4.7 09/22/2017   CL 101 09/22/2017   CREATININE 0.86 09/22/2017   BUN 21 09/22/2017   CO2 30 09/22/2017   TSH 0.78 03/30/2018   INR 0.96 04/14/2016   HGBA1C 5.8 09/13/2015    Dg Bone Density (dxa)  Result Date: 04/20/2017 EXAM: DUAL X-RAY ABSORPTIOMETRY (DXA) FOR BONE MINERAL DENSITY IMPRESSION: Dear Dr Marella Chimes, Your patient [name] completed a BMD test on 04/20/2017 using the [place] (analysis version: 14.10) manufactured by [employer]. The following summarizes the results of our evaluation. PATIENT BIOGRAPHICAL: Name: [name], [name] Patient ID: [id number] Birth Date: 1927-10-26 Height: 62.5 in. Gender: Female Exam Date: 04/20/2017 Weight: 140.8 lbs. Indications: Advanced Age, Caucasian, Family Hx of Osteoporosis, Low Body Weight, Osteoporotic, POSTmenopausal, Rheumatoid Arthritis Fractures: Treatments: 81 MG ASPIRIN, levothyroxin, prolia, Vitamin D ASSESSMENT: The BMD measured at Femur Neck Left is 0.677 g/cm2 with a T-score of -2.6. This patient is considered OSTEOPOROTIC according to [facility]) criteria. L1 was excluded due to degenerative changes. Patient is not a candidate for FRAX assessment due to osteoporosis report and Prolia use. Site Region Measured Measured WHO Young Adult BMD Date       Age      Classification T-score DualFemur Neck Left 04/20/2017 88.0 Osteoporosis -2.6 0.677 g/cm2 AP Spine L2-L4 04/20/2017 88.0 Osteopenia -1.9 0.976 g/cm2 World Health Organization [facility]) criteria for post-menopausal, Caucasian Women: Normal:       T-score at or above -1 SD Osteopenia:   T-score between -1 and -2.5 SD Osteoporosis: T-score at or below -2.5 SD RECOMMENDATIONS:  [place] recommends that FDA-approved medical therapies be considered in postemenopausal women and men age 95 or older with a: 1. Hip or vertebral (clinical or morphometric) fracture. 2. T-score of < -2.5at the spine or hip. 3. Ten-year fracture probability by FRAX of 3% or greater for hip fracture or 20% or greater for major osteoporotic fracture. All treatment decisions require clinical judgment and consideration of individual patient factors, including patient preferences, co-morbidities, previous drug use, risk factors not captured in the FRAX model (e.g. falls, vitamin D deficiency, increased bone turnover, interval significant decline in bone density) and possible under - or over-estimation of fracture risk by FRAX. All patients should ensure an adequate intake of dietary calcium (1200 mg/d) and vitamin D (800 IU daily) unless contraindicated. FOLLOW-UP: People with diagnosed cases of osteoporosis or at high risk for fracture should have regular bone mineral density tests. For patients eligible for Medicare, routine testing is allowed once every 2 years. The testing frequency can be increased  to one year for patients who have rapidly progressing disease, those who are receiving or discontinuing medical therapy to restore bone mass, or have additional risk factors. I have reviewed this report, and agree with the above findings. Mark A. Thornton Papas, M.D. [facility] Electronically Signed   By: Lavonia Dana M.D.   On: 04/20/2017 10:05    Assessment & Plan:   Problem List Items Addressed This Visit    Community acquired pneumonia    Left lower lob infiltrate noted on chest x ray 2 weeks ago.  clinically resolved except for post nasal  Drip . saline irrigation recommended and use of antihistamine. .  repeat chest x ray in 4 weeks.        Other Visit Diagnoses    Abnormal chest x-ray    -  Primary   Relevant Orders   DG Chest 2 View    A total of 25 minutes of face to face  time was spent with patient more than half of which was spent in counselling about the above mentioned conditions  and coordination of care   I am having [name] maintain her aspirin EC, cholecalciferol, predniSONE, Methotrexate Sodium (TREXALL PO), FOLIC ACID PO, metoprolol succinate, levothyroxine, and losartan.  No orders of the defined types were placed in this encounter.   Medications Discontinued During This Encounter  Medication Reason  . levothyroxine (SYNTHROID, LEVOTHROID) 072 MCG tablet Duplicate    Follow-up: No follow-ups on file.   Crecencio Mc, MD

## 2018-04-26 DIAGNOSIS — J189 Pneumonia, unspecified organism: Secondary | ICD-10-CM | POA: Insufficient documentation

## 2018-04-26 NOTE — Assessment & Plan Note (Addendum)
Left lower lob infiltrate noted on chest x ray 2 weeks ago.  clinically resolved except for post nasal  Drip . saline irrigation recommended and use of antihistamine. .  repeat chest x ray in 4 weeks.

## 2018-05-04 ENCOUNTER — Ambulatory Visit (INDEPENDENT_AMBULATORY_CARE_PROVIDER_SITE_OTHER): Payer: Medicare Other | Admitting: Gastroenterology

## 2018-05-04 ENCOUNTER — Encounter: Payer: Self-pay | Admitting: Gastroenterology

## 2018-05-04 VITALS — BP 126/70 | HR 80 | Ht 62.5 in | Wt 141.8 lb

## 2018-05-04 DIAGNOSIS — K5900 Constipation, unspecified: Secondary | ICD-10-CM

## 2018-05-04 NOTE — Patient Instructions (Signed)
Miralax daily F/U 2-3 MONTHS   High-Fiber Diet Fiber, also called dietary fiber, is a type of carbohydrate found in fruits, vegetables, whole grains, and beans. A high-fiber diet can have many health benefits. Your health care provider may recommend a high-fiber diet to help:  Prevent constipation. Fiber can make your bowel movements more regular.  Lower your cholesterol.  Relieve hemorrhoids, uncomplicated diverticulosis, or irritable bowel syndrome.  Prevent overeating as part of a weight-loss plan.  Prevent heart disease, type 2 diabetes, and certain cancers.  What is my plan? The recommended daily intake of fiber includes:  38 grams for men under age 32.  33 grams for men over age 78.  61 grams for women under age 47.  73 grams for women over age 75.  You can get the recommended daily intake of dietary fiber by eating a variety of fruits, vegetables, grains, and beans. Your health care provider may also recommend a fiber supplement if it is not possible to get enough fiber through your diet. What do I need to know about a high-fiber diet?  Fiber supplements have not been widely studied for their effectiveness, so it is better to get fiber through food sources.  Always check the fiber content on thenutrition facts label of any prepackaged food. Look for foods that contain at least 5 grams of fiber per serving.  Ask your dietitian if you have questions about specific foods that are related to your condition, especially if those foods are not listed in the following section.  Increase your daily fiber consumption gradually. Increasing your intake of dietary fiber too quickly may cause bloating, cramping, or gas.  Drink plenty of water. Water helps you to digest fiber. What foods can I eat? Grains Whole-grain breads. Multigrain cereal. Oats and oatmeal. Brown rice. Barley. Bulgur wheat. [place]. Bran muffins. Popcorn. Rye wafer crackers. Vegetables Sweet potatoes. Spinach.  Kale. Artichokes. Cabbage. Broccoli. Green peas. Carrots. Squash. Fruits Berries. Pears. Apples. Oranges. Avocados. Prunes and raisins. Dried figs. Meats and Other Protein Sources [place], kidney, pinto, and soy beans. Split peas. Lentils. Nuts and seeds. Dairy Fiber-fortified yogurt. Beverages Fiber-fortified soy milk. Fiber-fortified orange juice. Other Fiber bars. The items listed above may not be a complete list of recommended foods or beverages. Contact your dietitian for more options. What foods are not recommended? Grains White bread. Pasta made with refined flour. White rice. Vegetables Fried potatoes. Canned vegetables. Well-cooked vegetables. Fruits Fruit juice. Cooked, strained fruit. Meats and Other Protein Sources Fatty cuts of meat. Fried Sales executive or fried fish. Dairy Milk. Yogurt. Cream cheese. Sour cream. Beverages Soft drinks. Other Cakes and pastries. Butter and oils. The items listed above may not be a complete list of foods and beverages to avoid. Contact your dietitian for more information. What are some tips for including high-fiber foods in my diet?  Eat a wide variety of high-fiber foods.  Make sure that half of all grains consumed each day are whole grains.  Replace breads and cereals made from refined flour or white flour with whole-grain breads and cereals.  Replace white rice with brown rice, bulgur wheat, or millet.  Start the day with a breakfast that is high in fiber, such as a cereal that contains at least 5 grams of fiber per serving.  Use beans in place of meat in soups, salads, or pasta.  Eat high-fiber snacks, such as berries, raw vegetables, nuts, or popcorn. This information is not intended to replace advice given to you by your health  care provider. Make sure you discuss any questions you have with your health care provider. Document Released: 10/12/2005 Document Revised: 03/19/2016 Document Reviewed: 03/27/2014 Elsevier Interactive  Patient Education  [employer].

## 2018-05-04 NOTE — Progress Notes (Signed)
[name] [address]  [place], [place] [address]  Main: [phone]  Fax: [phone]   Gastroenterology Consultation  Referring Provider:     Crecencio Mc, MD Primary Care Physician:  Crecencio Mc, MD Primary Gastroenterologist:  Dr. Vonda Vargas Reason for Consultation:     Constipation        HPI:    Chief Complaint  Patient presents with  . Establish Care    referral from M. Arnett, FNP-constipation vs loose stools    [name] is a 84 y.o. y/o female referred for consultation & management  by Dr. Crecencio Mc, MD.  Patient reports intermittent for years.  Reports seeing Dr. Vira Agar for this, and then Dr. Allen Norris last year.  Reports having a bowel movement every 2 to 3 days, and they are hard in consistency, small size.  Every 6 weeks, will have constipation, that leads to abdominal cramping, followed by a bowel movement, and that is hard initially, and then turns to loose stools. The patient denies abdominal or flank pain, anorexia, nausea or vomiting, dysphagia, black or bloody stools or weight loss.  She reports history of a colonoscopy over 10 years ago.  States no polyps were found at the time.  Report not available.  Uses prune when constipated.  Tries to eat fruits and vegetables.  Does not use any medications for her bowel movements.  Once in a while will use an over-the-counter stool softener.   Past Medical History:  Diagnosis Date  . Arthritis   . Carpal tunnel syndrome   . Gait abnormality    has for 26yr-no use of cane  . Hx of TIA (transient ischemic attack) and stroke   . Hypertension   . Hypothyroidism   . Osteoporosis   . Wears glasses   . Wears hearing aid     Past Surgical History:  Procedure Laterality Date  . APPENDECTOMY    . CARPAL TUNNEL RELEASE Left 07/11/2013   Procedure: CARPAL TUNNEL RELEASE;  Surgeon: Cammie Sickle., MD;  Location: [facility];  Service: Orthopedics;   Laterality: Left;  . COLONOSCOPY    . SUBDURAL HEMATOMA EVACUATION VIA CRANIOTOMY  2007  . TONSILLECTOMY      Prior to Admission medications   Medication Sig Start Date End Date Taking? Authorizing Provider  aspirin EC 81 MG tablet Take 81 mg by mouth every evening.   Yes [provider]  cholecalciferol (VITAMIN D) 1000 units tablet Take 1,000 Units by mouth 2 (two) times daily.   Yes [provider]  FOLIC ACID PO Take by mouth.   Yes [provider]  levothyroxine (SYNTHROID, LEVOTHROID) 100 MCG tablet TAKE 1 TABLET BY MOUTH ONCE DAILY ON AN EMPTY STOMACH. WAIT 30 MINUTES BEFORE TAKING OTHER MEDS. 02/07/18  Yes Crecencio Mc, MD  losartan (COZAAR) 100 MG tablet TAKE 1 TABLET BY MOUTH ONCE DAILY 04/25/18  Yes Crecencio Mc, MD  Methotrexate Sodium (TREXALL PO) Take by mouth.   Yes [provider]  metoprolol succinate (TOPROL-XL) 50 MG 24 hr tablet Take 1 tablet (50 mg total) by mouth daily. Take with or immediately following evening meal. 11/30/17  Yes Crecencio Mc, MD  polyethylene glycol (MIRALAX / GLYCOLAX) packet Take 17 g by mouth daily.   Yes [provider]  predniSONE (DELTASONE) 1 MG tablet Take 3 mg by mouth every other day.  05/12/17  Yes [provider]  Family History  Problem Relation Age of Onset  . Cancer Mother 61       breast ca  . Cancer Maternal Aunt   . Cancer Brother        pancreatic ca  . Breast cancer Sister   . Brain cancer Brother        astrocytoma?  . Kidney cancer Sister      Social History   Tobacco Use  . Smoking status: Never Smoker  . Smokeless tobacco: Never Used  Substance Use Topics  . Alcohol use: Yes    Comment: occ  . Drug use: No    Allergies as of 05/04/2018 - Review Complete 05/04/2018  Allergen Reaction Noted  . Pollen extract    . Levofloxacin Anxiety 04/25/2018    Review of Systems:    All systems reviewed and negative except where noted in HPI.   Physical  Exam:  BP 126/70   Pulse 80   Ht 5' 2.5" (1.588 m)   Wt 141 lb 12.8 oz (64.3 kg)   BMI 25.52 kg/m  No LMP recorded. Patient is postmenopausal. Psych:  Alert and cooperative. Normal mood and affect. General:   Alert,  Well-developed, well-nourished, pleasant and cooperative in NAD Head:  Normocephalic and atraumatic. Eyes:  Sclera clear, no icterus.   Conjunctiva pink. Ears:  Normal auditory acuity. Nose:  No deformity, discharge, or lesions. Mouth:  No deformity or lesions,oropharynx pink & moist. Neck:  Supple; no masses or thyromegaly. Lungs:  Respirations even and unlabored.  Clear throughout to auscultation.   No wheezes, crackles, or rhonchi. No acute distress. Heart:  Regular rate and rhythm; no murmurs, clicks, rubs, or gallops. Abdomen:  Normal bowel sounds.  No bruits.  Soft, non-tender and non-distended without masses, hepatosplenomegaly or hernias noted.  No guarding or rebound tenderness.    Msk:  Symmetrical without gross deformities. Good, equal movement & strength bilaterally. Pulses:  Normal pulses noted. Extremities:  No clubbing or edema.  No cyanosis. Neurologic:  Alert and oriented x3;  grossly normal neurologically. Skin:  Intact without significant lesions or rashes. No jaundice. Lymph Nodes:  No significant cervical adenopathy. Psych:  Alert and cooperative. Normal mood and affect.   Labs: CBC    Component Value Date/Time   WBC 7.8 09/22/2017 0834   RBC 4.47 09/22/2017 0834   HGB 13.9 09/22/2017 0834   HCT 42.2 09/22/2017 0834   PLT 265.0 09/22/2017 0834   MCV 94.5 09/22/2017 0834   MCH 30.1 04/14/2016 1818   MCHC 32.9 09/22/2017 0834   RDW 13.0 09/22/2017 0834   LYMPHSABS 1.9 09/22/2017 0834   MONOABS 0.6 09/22/2017 0834   EOSABS 0.2 09/22/2017 0834   BASOSABS 0.1 09/22/2017 0834   CMP     Component Value Date/Time   NA 137 09/22/2017 0834   K 4.7 09/22/2017 0834   CL 101 09/22/2017 0834   CO2 30 09/22/2017 0834   GLUCOSE 115 (H) 09/22/2017  0834   BUN 21 09/22/2017 0834   CREATININE 0.86 09/22/2017 0834   CALCIUM 9.4 09/22/2017 0834   PROT 7.1 09/22/2017 0834   ALBUMIN 4.1 09/22/2017 0834   AST 18 09/22/2017 0834   ALT 16 09/22/2017 0834   ALKPHOS 53 09/22/2017 0834   BILITOT 0.7 09/22/2017 0834   GFRNONAA 58 (L) 04/14/2016 1818   GFRAA >60 04/14/2016 1818    Imaging Studies: No results found.  Assessment and Plan:   [name] is a 84 y.o. y/o female has been  referred for chronic constipation without any alarm symptoms  High-fiber diet MiraLAX daily with goal of 1-2 soft bowel movements daily.  If not at goal, patient instructed to increase dose to twice daily.  If loose stools with the medication, patient asked to decrease the medication to every other day, or half dose daily.  Patient verbalized understanding  If symptoms not improved, patient was asked to call us We will continue follow-up in clinic  No alarm symptoms present to indicate colonoscopy at this time Screening colonoscopies are not indicated after 84 years of age, and this was discussed with her in detail  However, given her chronic symptoms, and visits with gastroenterologists in the past, we did discuss risks and benefits of a colonoscopy in detail.  She would like to try a high-fiber diet and MiraLAX daily first, and not schedule a colonoscopy at this time which is reasonable.  If symptoms do not improve, she would like to consider colonoscopy at that time.   Dr [name]

## 2018-05-10 DIAGNOSIS — Z8739 Personal history of other diseases of the musculoskeletal system and connective tissue: Secondary | ICD-10-CM | POA: Diagnosis not present

## 2018-05-10 DIAGNOSIS — M0600 Rheumatoid arthritis without rheumatoid factor, unspecified site: Secondary | ICD-10-CM | POA: Diagnosis not present

## 2018-05-23 ENCOUNTER — Telehealth: Payer: Self-pay | Admitting: Internal Medicine

## 2018-05-23 NOTE — Telephone Encounter (Signed)
Insurance verification for Prolia filed on [employer] Portal. 

## 2018-05-24 ENCOUNTER — Ambulatory Visit (INDEPENDENT_AMBULATORY_CARE_PROVIDER_SITE_OTHER): Payer: Medicare Other

## 2018-05-24 DIAGNOSIS — J189 Pneumonia, unspecified organism: Secondary | ICD-10-CM | POA: Diagnosis not present

## 2018-05-24 DIAGNOSIS — R9389 Abnormal findings on diagnostic imaging of other specified body structures: Secondary | ICD-10-CM | POA: Diagnosis not present

## 2018-05-26 NOTE — Telephone Encounter (Signed)
Patient has appointment scheduled with PCP Prolia approved at no cost.

## 2018-05-26 NOTE — Telephone Encounter (Signed)
Patient notified by detailed message (per DPR).  

## 2018-05-30 ENCOUNTER — Encounter: Payer: Self-pay | Admitting: Internal Medicine

## 2018-05-30 ENCOUNTER — Ambulatory Visit (INDEPENDENT_AMBULATORY_CARE_PROVIDER_SITE_OTHER): Payer: Medicare Other | Admitting: Internal Medicine

## 2018-05-30 VITALS — BP 120/60 | HR 80 | Temp 97.7°F | Resp 15 | Ht 62.5 in | Wt 144.8 lb

## 2018-05-30 DIAGNOSIS — K5900 Constipation, unspecified: Secondary | ICD-10-CM

## 2018-05-30 DIAGNOSIS — R4181 Age-related cognitive decline: Secondary | ICD-10-CM | POA: Diagnosis not present

## 2018-05-30 DIAGNOSIS — E039 Hypothyroidism, unspecified: Secondary | ICD-10-CM

## 2018-05-30 DIAGNOSIS — I10 Essential (primary) hypertension: Secondary | ICD-10-CM | POA: Diagnosis not present

## 2018-05-30 DIAGNOSIS — J181 Lobar pneumonia, unspecified organism: Secondary | ICD-10-CM | POA: Diagnosis not present

## 2018-05-30 DIAGNOSIS — J189 Pneumonia, unspecified organism: Secondary | ICD-10-CM

## 2018-05-30 DIAGNOSIS — M19011 Primary osteoarthritis, right shoulder: Secondary | ICD-10-CM

## 2018-05-30 DIAGNOSIS — Z79899 Other long term (current) drug therapy: Secondary | ICD-10-CM | POA: Diagnosis not present

## 2018-05-30 DIAGNOSIS — M19012 Primary osteoarthritis, left shoulder: Secondary | ICD-10-CM

## 2018-05-30 DIAGNOSIS — M0600 Rheumatoid arthritis without rheumatoid factor, unspecified site: Secondary | ICD-10-CM | POA: Diagnosis not present

## 2018-05-30 MED ORDER — DENOSUMAB 60 MG/ML ~~LOC~~ SOSY
60.0000 mg | PREFILLED_SYRINGE | Freq: Once | SUBCUTANEOUS | Status: AC
  Administered 2018-05-30: 60 mg via SUBCUTANEOUS

## 2018-05-30 MED ORDER — PREDNISONE 10 MG PO TABS
ORAL_TABLET | ORAL | 0 refills | Status: DC
Start: 2018-05-30 — End: 2018-10-06

## 2018-05-30 MED ORDER — ZOSTER VAC RECOMB ADJUVANTED 50 MCG/0.5ML IM SUSR: 0.5000 mL | Freq: Once | INTRAMUSCULAR | 1 refills | Status: AC

## 2018-05-30 NOTE — Progress Notes (Signed)
Subjective:  Patient ID: [name], female    DOB: [birth year]  Age: 84 y.o. MRN: [id number]  CC: The primary encounter diagnosis was Essential hypertension. Diagnoses of Seronegative rheumatoid arthritis ([name]), Osteoarthritis of both shoulders, unspecified osteoarthritis type, Community acquired pneumonia of left lower lobe of lung ([place]), Acquired hypothyroidism, Constipation, unspecified constipation type, Age-related cognitive decline, and Long-term use of high-risk medication were also pertinent to this visit.  HPI [name] presents for follow up on hypertension, hypothyroid and recent pneumonia  Her cough has resolved,  But she is clearing throat more often than used to.  Using benadryl at bedtime  Can't get used to [name] sinus rinse.  Too aggressive.   Chronic constipation:  saw [name] on July 10  . Recommended use of miralax daily. Felt it caused her to have excessive BMs that were formed but the urgency  Was interfering with her social activities and caused her to become housebound.   Takes it in the morning.   Has decided to move into the [name] in [name] (private apartment with staged assistance as needed) before the winter    Lab Results  Component Value Date   TSH 0.78 03/30/2018   Lab Results  Component Value Date   CREATININE 0.86 09/22/2017   Lab Results  Component Value Date   NA 137 09/22/2017   K 4.7 09/22/2017   CL 101 09/22/2017   CO2 30 09/22/2017     Outpatient Medications Prior to Visit  Medication Sig Dispense Refill  . aspirin EC 81 MG tablet Take 81 mg by mouth every evening.    . cholecalciferol (VITAMIN D) 1000 units tablet Take 1,000 Units by mouth 2 (two) times daily.    . diphenhydrAMINE (BENADRYL ALLERGY) 25 MG tablet Take 25 mg by mouth at bedtime. Pt states that she takes 1/2 tablet nightly.    [name] FOLIC ACID PO Take by mouth.    . levothyroxine (SYNTHROID, LEVOTHROID) 100 MCG tablet TAKE 1 TABLET BY MOUTH ONCE DAILY ON AN  EMPTY STOMACH. WAIT 30 MINUTES BEFORE TAKING OTHER MEDS. 90 tablet 1  . loratadine (ALLERGY) 10 MG tablet Take 10 mg by mouth daily.    [name] losartan (COZAAR) 100 MG tablet TAKE 1 TABLET BY MOUTH ONCE DAILY 90 tablet 1  . Methotrexate Sodium (TREXALL PO) Take by mouth.    . metoprolol succinate (TOPROL-XL) 50 MG 24 hr tablet Take 1 tablet (50 mg total) by mouth daily. Take with or immediately following evening meal. 90 tablet 3  . polyethylene glycol (MIRALAX / GLYCOLAX) packet Take 17 g by mouth daily.    . predniSONE (DELTASONE) 1 MG tablet Take 3 mg by mouth every other day.      No facility-administered medications prior to visit.     Review of Systems;  Patient denies headache, fevers, malaise, unintentional weight loss, skin rash, eye pain, sinus congestion and sinus pain, sore throat, dysphagia,  hemoptysis , cough, dyspnea, wheezing, chest pain, palpitations, orthopnea, edema, abdominal pain, nausea, melena, diarrhea, constipation, flank pain, dysuria, hematuria, urinary  Frequency, nocturia, numbness, tingling, seizures,  Focal weakness, Loss of consciousness,  Tremor, insomnia, depression, anxiety, and suicidal ideation.      Objective:  BP 120/60 (BP Location: Left Arm, Patient Position: Sitting, Cuff Size: Normal)   Pulse 80   Temp 97.7 F (36.5 C) (Oral)   Resp 15   Ht 5' 2.5" (1.588 m)   Wt 144 lb 12.8 oz (65.7 kg)  SpO2 96%   BMI 26.06 kg/m   BP Readings from Last 3 Encounters:  05/30/18 120/60  05/04/18 126/70  04/25/18 124/68    Wt Readings from Last 3 Encounters:  05/30/18 144 lb 12.8 oz (65.7 kg)  05/04/18 141 lb 12.8 oz (64.3 kg)  04/25/18 143 lb 12.8 oz (65.2 kg)    General appearance: alert, cooperative and appears stated age Ears: normal TM's and external ear canals both ears Throat: lips, mucosa, and tongue normal; teeth and gums normal Neck: no adenopathy, no carotid bruit, supple, symmetrical, trachea midline and thyroid not enlarged, symmetric, no  tenderness/mass/nodules Back: symmetric, no curvature. ROM normal. No CVA tenderness. Lungs: clear to auscultation bilaterally Heart: regular rate and rhythm, S1, S2 normal, no murmur, click, rub or gallop Abdomen: soft, non-tender; bowel sounds normal; no masses,  no organomegaly Pulses: 2+ and symmetric Skin: Skin color, texture, turgor normal. No rashes or lesions Lymph nodes: Cervical, supraclavicular, and axillary nodes normal.  Lab Results  Component Value Date   HGBA1C 5.8 09/13/2015   HGBA1C 6.0 03/05/2015    Lab Results  Component Value Date   CREATININE 0.86 09/22/2017   CREATININE 0.99 11/09/2016   CREATININE 0.92 11/05/2016    Lab Results  Component Value Date   WBC 7.8 09/22/2017   HGB 13.9 09/22/2017   HCT 42.2 09/22/2017   PLT 265.0 09/22/2017   GLUCOSE 115 (H) 09/22/2017   CHOL 257 (H) 11/05/2016   TRIG 146.0 11/05/2016   HDL 55.90 11/05/2016   LDLDIRECT 78.0 06/10/2016   LDLCALC 172 (H) 11/05/2016   ALT 16 09/22/2017   AST 18 09/22/2017   NA 137 09/22/2017   K 4.7 09/22/2017   CL 101 09/22/2017   CREATININE 0.86 09/22/2017   BUN 21 09/22/2017   CO2 30 09/22/2017   TSH 0.78 03/30/2018   INR 0.96 04/14/2016   HGBA1C 5.8 09/13/2015    Dg Bone Density (dxa)  Result Date: 04/20/2017 EXAM: DUAL X-RAY ABSORPTIOMETRY (DXA) FOR BONE MINERAL DENSITY IMPRESSION: Dear Dr [name], Your patient [name] completed a BMD test on 04/20/2017 using the [place] (analysis version: 14.10) manufactured by [employer]. The following summarizes the results of our evaluation. PATIENT BIOGRAPHICAL: Name: [name], [name] Patient ID: [id number] Birth Date: 18-Feb-1927 Height: 62.5 in. Gender: Female Exam Date: 04/20/2017 Weight: 140.8 lbs. Indications: Advanced Age, Caucasian, Family Hx of Osteoporosis, Low Body Weight, Osteoporotic, POSTmenopausal, Rheumatoid Arthritis Fractures: Treatments: 81 MG ASPIRIN, levothyroxin, prolia, Vitamin D  ASSESSMENT: The BMD measured at Femur Neck Left is 0.677 g/cm2 with a T-score of -2.6. This patient is considered OSTEOPOROTIC according to [facility]) criteria. L1 was excluded due to degenerative changes. Patient is not a candidate for FRAX assessment due to osteoporosis report and Prolia use. Site Region Measured Measured WHO Young Adult BMD Date       Age      Classification T-score DualFemur Neck Left 04/20/2017 88.0 Osteoporosis -2.6 0.677 g/cm2 AP Spine L2-L4 04/20/2017 88.0 Osteopenia -1.9 0.976 g/cm2 World Health Organization [facility]) criteria for post-menopausal, Caucasian Women: Normal:       T-score at or above -1 SD Osteopenia:   T-score between -1 and -2.5 SD Osteoporosis: T-score at or below -2.5 SD RECOMMENDATIONS: [place] recommends that FDA-approved medical therapies be considered in postemenopausal women and men age 79 or older with a: 1. Hip or vertebral (clinical or morphometric) fracture. 2. T-score of < -2.5at the spine or hip. 3. Ten-year fracture  probability by FRAX of 3% or greater for hip fracture or 20% or greater for major osteoporotic fracture. All treatment decisions require clinical judgment and consideration of individual patient factors, including patient preferences, co-morbidities, previous drug use, risk factors not captured in the FRAX model (e.g. falls, vitamin D deficiency, increased bone turnover, interval significant decline in bone density) and possible under - or over-estimation of fracture risk by FRAX. All patients should ensure an adequate intake of dietary calcium (1200 mg/d) and vitamin D (800 IU daily) unless contraindicated. FOLLOW-UP: People with diagnosed cases of osteoporosis or at high risk for fracture should have regular bone mineral density tests. For patients eligible for Medicare, routine testing is allowed once every 2 years. The testing frequency can be increased to one year for patients who have rapidly  progressing disease, those who are receiving or discontinuing medical therapy to restore bone mass, or have additional risk factors. I have reviewed this report, and agree with the above findings. Mark A. Thornton Papas, M.D. [facility] Electronically Signed   By: Lavonia Dana M.D.   On: 04/20/2017 10:05    Assessment & Plan:   Problem List Items Addressed This Visit    Age-related cognitive decline    She is managing her affairs well but is concerned about her short term memory and has decided to move to assisted living in [place] by winter.       Long-term use of high-risk medication    She requires serial CBC/CMEt every 3 months due to use of methotrexate and is due next in September       Osteoarthritis, shoulder   Relevant Medications   predniSONE (DELTASONE) 10 MG tablet   denosumab (PROLIA) injection 60 mg (Completed)   Essential hypertension - Primary   Constipation    Recommended taking the miralax at night, or reducing the dose to every other day       Acquired hypothyroidism    Thyroid function is WNL on current dose.  No current changes needed.   Lab Results  Component Value Date   TSH 0.78 03/30/2018        Seronegative rheumatoid arthritis ([place])   Relevant Orders   CBC with Differential/Platelet   Comprehensive metabolic panel   Sedimentation rate   C-reactive protein   Community acquired pneumonia    Resolved clinically and radiographically,  But peribronchial thickening suggestive of persistent bronchitis.  Lung exam is normal.  She has declined a steroid taper       Relevant Medications   loratadine (ALLERGY) 10 MG tablet   diphenhydrAMINE (BENADRYL ALLERGY) 25 MG tablet    \A total of 25 minutes of face to face time was spent with patient more than half of which was spent in counselling about the above mentioned conditions  and coordination of care   I am having Br[facility]ley W. Welty start on Zoster Vaccine Adjuvanted and predniSONE. I am also having her  maintain her aspirin EC, cholecalciferol, predniSONE, Methotrexate Sodium (TREXALL PO), FOLIC ACID PO, metoprolol succinate, levothyroxine, losartan, polyethylene glycol, loratadine, and diphenhydrAMINE. We administered denosumab.  Meds ordered this encounter  Medications  . Zoster Vaccine Adjuvanted [facility]) injection    Sig: Inject 0.5 mLs into the muscle once for 1 dose.    Dispense:  1 each    Refill:  1  . predniSONE (DELTASONE) 10 MG tablet    Sig: 6 tablets on Day 1 , then reduce by 1 tablet daily until gone    Dispense:  21  tablet    Refill:  0  . denosumab (PROLIA) injection 60 mg    There are no discontinued medications.  Follow-up: Return in about 6 months (around 11/30/2018).   Crecencio Mc, MD

## 2018-05-30 NOTE — Patient Instructions (Addendum)
Your rheumatologist needs certain blood work done that can be done here every  3 months,    Please send me a copy of the labs she had done in July and I will order them for October.    If your cough /thraot drainage persists, you can start the prednisone taper I have prescribed for you.   The ShingRx vaccine is now available in local pharmacies and is much more protective thant Zostavaxs,  It is therefore ADVISED for all interested adults over 50 to prevent shingles .  I have given you a prescription for it today  For your constipation,  You can take the miralax at bedtime by itself.  Take the aspirin with the metoprolol after dinner so it gets absorbed.       Take the miralax at bedtime. YOu can reduce the dose to every other day if the stools are still too frequent.

## 2018-05-31 NOTE — Assessment & Plan Note (Addendum)
She requires serial CBC/CMEt every 3 months due to use of methotrexate and is due next in September

## 2018-05-31 NOTE — Assessment & Plan Note (Signed)
Recommended taking the miralax at night, or reducing the dose to every other day

## 2018-05-31 NOTE — Assessment & Plan Note (Signed)
Thyroid function is WNL on current dose.  No current changes needed.   Lab Results  Component Value Date   TSH 0.78 03/30/2018    

## 2018-05-31 NOTE — Assessment & Plan Note (Signed)
She is managing her affairs well but is concerned about her short term memory and has decided to move to assisted living in [place] by winter.

## 2018-05-31 NOTE — Assessment & Plan Note (Signed)
Resolved clinically and radiographically,  But peribronchial thickening suggestive of persistent bronchitis.  Lung exam is normal.  She has declined a steroid taper

## 2018-06-01 ENCOUNTER — Ambulatory Visit: Payer: Medicare Other | Admitting: Gastroenterology

## 2018-06-01 ENCOUNTER — Encounter

## 2018-06-06 ENCOUNTER — Other Ambulatory Visit: Payer: Self-pay | Admitting: Internal Medicine

## 2018-06-09 ENCOUNTER — Encounter

## 2018-06-09 ENCOUNTER — Ambulatory Visit: Payer: Medicare Other | Admitting: Gastroenterology

## 2018-06-14 DIAGNOSIS — E559 Vitamin D deficiency, unspecified: Secondary | ICD-10-CM | POA: Diagnosis not present

## 2018-06-14 DIAGNOSIS — E663 Overweight: Secondary | ICD-10-CM | POA: Diagnosis not present

## 2018-06-14 DIAGNOSIS — Z6826 Body mass index (BMI) 26.0-26.9, adult: Secondary | ICD-10-CM | POA: Diagnosis not present

## 2018-06-14 DIAGNOSIS — M0600 Rheumatoid arthritis without rheumatoid factor, unspecified site: Secondary | ICD-10-CM | POA: Diagnosis not present

## 2018-06-14 DIAGNOSIS — Z8739 Personal history of other diseases of the musculoskeletal system and connective tissue: Secondary | ICD-10-CM | POA: Diagnosis not present

## 2018-06-14 DIAGNOSIS — Z840 Family history of diseases of the skin and subcutaneous tissue: Secondary | ICD-10-CM | POA: Diagnosis not present

## 2018-06-14 DIAGNOSIS — R768 Other specified abnormal immunological findings in serum: Secondary | ICD-10-CM | POA: Diagnosis not present

## 2018-06-14 DIAGNOSIS — M810 Age-related osteoporosis without current pathological fracture: Secondary | ICD-10-CM | POA: Diagnosis not present

## 2018-08-04 ENCOUNTER — Ambulatory Visit (INDEPENDENT_AMBULATORY_CARE_PROVIDER_SITE_OTHER): Payer: Medicare Other | Admitting: Gastroenterology

## 2018-08-04 ENCOUNTER — Encounter: Payer: Self-pay | Admitting: Gastroenterology

## 2018-08-04 VITALS — BP 158/71 | HR 79 | Ht 62.0 in | Wt 146.6 lb

## 2018-08-04 DIAGNOSIS — K5900 Constipation, unspecified: Secondary | ICD-10-CM | POA: Diagnosis not present

## 2018-08-04 DIAGNOSIS — M810 Age-related osteoporosis without current pathological fracture: Secondary | ICD-10-CM | POA: Insufficient documentation

## 2018-08-04 NOTE — Progress Notes (Signed)
Mary Antigua, MD [address]  [place], [place] [address]  Main: [phone]  Fax: [phone]   Primary Care Physician: Mary Mc, MD  Primary Gastroenterologist:  Dr. Vonda Vargas  Chief Complaint  Patient presents with  . Follow-up    Constipation    HPI: [name] is a 84 y.o. female here for follow-up of constipation.  Patient is happy with the progress she has made with the MiraLAX, however, she is still trying to figure out a set schedule that works for her.  She was taking it once daily every day, and then started having loose stools, so started skipping days.  However, overall she feels her constipation is relieved and she just has to find a regimen that works for her along with her high-fiber diet.  No abdominal pain.  No nausea or vomiting.  No hematochezia or melena.  Since holding her MiraLAX for the last 3 days she is now having formed bowel movements, and wants to restart her MiraLAX today.  She has history of a colonoscopy over 10 years ago as per patient's and no polyps were found at that time.  Report not available.  Current Outpatient Medications  Medication Sig Dispense Refill  . aspirin EC 81 MG tablet Take 81 mg by mouth every evening.    . cholecalciferol (VITAMIN D) 1000 units tablet Take 1,000 Units by mouth 2 (two) times daily.    . diphenhydrAMINE (BENADRYL ALLERGY) 25 MG tablet Take 25 mg by mouth at bedtime. Pt states that she takes 1/2 tablet nightly.    [name] FOLIC ACID PO Take by mouth.    . levothyroxine (SYNTHROID, LEVOTHROID) 100 MCG tablet TAKE 1 TABLET BY MOUTH ONCE DAILY ON AN EMPTY STOMACH. WAIT 30 MINUTES BEFORE TAKING OTHER MEDS. 90 tablet 1  . losartan (COZAAR) 100 MG tablet TAKE 1 TABLET BY MOUTH ONCE DAILY 90 tablet 1  . Methotrexate Sodium (TREXALL PO) Take by mouth.    . metoprolol succinate (TOPROL-XL) 50 MG 24 hr tablet Take 1 tablet (50 mg total) by mouth daily. Take with or immediately following  evening meal. 90 tablet 3  . polyethylene glycol (MIRALAX / GLYCOLAX) packet Take 17 g by mouth daily.    . predniSONE (DELTASONE) 1 MG tablet Take 3 mg by mouth every other day.     . predniSONE (DELTASONE) 10 MG tablet 6 tablets on Day 1 , then reduce by 1 tablet daily until gone 21 tablet 0  . loratadine (ALLERGY) 10 MG tablet Take 10 mg by mouth daily.     No current facility-administered medications for this visit.     Allergies as of 08/04/2018 - Review Complete 08/04/2018  Allergen Reaction Noted  . Pollen extract    . Levofloxacin Anxiety 04/25/2018    ROS:  General: Negative for anorexia, weight loss, fever, chills, fatigue, weakness. ENT: Negative for hoarseness, difficulty swallowing , nasal congestion. CV: Negative for chest pain, angina, palpitations, dyspnea on exertion, peripheral edema.  Respiratory: Negative for dyspnea at rest, dyspnea on exertion, cough, sputum, wheezing.  GI: See history of present illness. GU:  Negative for dysuria, hematuria, urinary incontinence, urinary frequency, nocturnal urination.  Endo: Negative for unusual weight change.    Physical Examination:   BP (!) 158/71   Pulse 79   Ht 5\' 2"  (1.575 m)   Wt 146 lb 9.6 oz (66.5 kg)   BMI 26.81 kg/m   General: Well-nourished, well-developed in no acute distress.  Eyes: No icterus. Conjunctivae pink. Mouth: Oropharyngeal mucosa moist and pink , no lesions erythema or exudate. Neck: Supple, Trachea midline Abdomen: Bowel sounds are normal, nontender, nondistended, no hepatosplenomegaly or masses, no abdominal bruits or hernia , no rebound or guarding.   Extremities: No lower extremity edema. No clubbing or deformities. Neuro: Alert and oriented x 3.  Grossly intact. Skin: Warm and dry, no jaundice.   Psych: Alert and cooperative, normal mood and affect.   Labs: CMP     Component Value Date/Time   NA 137 09/22/2017 0834   K 4.7 09/22/2017 0834   CL 101 09/22/2017 0834   CO2 30  09/22/2017 0834   GLUCOSE 115 (H) 09/22/2017 0834   BUN 21 09/22/2017 0834   CREATININE 0.86 09/22/2017 0834   CALCIUM 9.4 09/22/2017 0834   PROT 7.1 09/22/2017 0834   ALBUMIN 4.1 09/22/2017 0834   AST 18 09/22/2017 0834   ALT 16 09/22/2017 0834   ALKPHOS 53 09/22/2017 0834   BILITOT 0.7 09/22/2017 0834   GFRNONAA 58 (L) 04/14/2016 1818   GFRAA >60 04/14/2016 1818   Lab Results  Component Value Date   WBC 7.8 09/22/2017   HGB 13.9 09/22/2017   HCT 42.2 09/22/2017   MCV 94.5 09/22/2017   PLT 265.0 09/22/2017    Imaging Studies: No results found.  Assessment and Plan:   [name] is a 84 y.o. y/o female here for follow-up of constipation  Patient is overall doing much better on MiraLAX I have asked her to cut it down to every other day instead of every day However, she would like to take it daily and is proposing half a dose every day.  I have told her this is fine.   however, she would also like to avoid loose stools Therefore, I have asked her to take half a dose for 2 days and then skip a day.  She thinks that will work better for her and is willing to try that  She does not think she wants to try another medication as she is happy that her constipation is relieved, and that she is doing much better on MiraLAX, she just states she has to find a schedule of taking it that works well for her.  I have instructed her to hold the dose for 1 to 2 days if loose stools occur and then restart the medication at half the dose and even cut it down to only 2-3 times a week if that works well for her and she verbalized understanding.  I have encouraged her to call me with any questions or concerns.  She is not interested in colonoscopy at this time    Dr [name]

## 2018-08-12 ENCOUNTER — Other Ambulatory Visit (INDEPENDENT_AMBULATORY_CARE_PROVIDER_SITE_OTHER): Payer: Medicare Other

## 2018-08-12 DIAGNOSIS — M0600 Rheumatoid arthritis without rheumatoid factor, unspecified site: Secondary | ICD-10-CM | POA: Diagnosis not present

## 2018-08-12 LAB — COMPREHENSIVE METABOLIC PANEL
ALT: 16 U/L (ref 0–35)
AST: 17 U/L (ref 0–37)
Albumin: 4.1 g/dL (ref 3.5–5.2)
Alkaline Phosphatase: 60 U/L (ref 39–117)
BUN: 20 mg/dL (ref 6–23)
CALCIUM: 9.3 mg/dL (ref 8.4–10.5)
CO2: 29 mEq/L (ref 19–32)
CREATININE: 0.9 mg/dL (ref 0.40–1.20)
Chloride: 100 mEq/L (ref 96–112)
GFR: 62.61 mL/min (ref >60.00)
Glucose, Bld: 83 mg/dL (ref 70–99)
Potassium: 4.4 mEq/L (ref 3.5–5.1)
SODIUM: 136 meq/L (ref 135–145)
Total Bilirubin: 0.4 mg/dL (ref 0.2–1.2)
Total Protein: 7 g/dL (ref 6.0–8.3)

## 2018-08-12 LAB — CBC WITH DIFFERENTIAL/PLATELET
Basophils Absolute: 0.1 10*3/uL (ref 0.0–0.1)
Basophils Relative: 1.2 % (ref 0.0–3.0)
EOS PCT: 2.6 % (ref 0.0–5.0)
Eosinophils Absolute: 0.2 10*3/uL (ref 0.0–0.7)
HCT: 40.4 % (ref 36.0–46.0)
HEMOGLOBIN: 13.6 g/dL (ref 12.0–15.0)
LYMPHS ABS: 2.2 10*3/uL (ref 0.7–4.0)
Lymphocytes Relative: 29.9 % (ref 12.0–46.0)
MCHC: 33.6 g/dL (ref 30.0–36.0)
MCV: 95.9 fl (ref 78.0–100.0)
MONOS PCT: 11.4 % (ref 3.0–12.0)
Monocytes Absolute: 0.9 10*3/uL (ref 0.1–1.0)
Neutro Abs: 4.1 10*3/uL (ref 1.4–7.7)
Neutrophils Relative %: 54.9 % (ref 43.0–77.0)
Platelets: 284 10*3/uL (ref 150.0–400.0)
RBC: 4.22 Mil/uL (ref 3.87–5.11)
RDW: 13.6 % (ref 11.5–15.5)
WBC: 7.5 10*3/uL (ref 4.0–10.5)

## 2018-08-12 LAB — SEDIMENTATION RATE: Sed Rate: 16 mm/hr (ref 0–30)

## 2018-08-12 LAB — C-REACTIVE PROTEIN: CRP: 0.2 mg/dL — ABNORMAL LOW (ref 0.5–20.0)

## 2018-08-16 DIAGNOSIS — M65341 Trigger finger, right ring finger: Secondary | ICD-10-CM | POA: Diagnosis not present

## 2018-08-16 DIAGNOSIS — E663 Overweight: Secondary | ICD-10-CM | POA: Diagnosis not present

## 2018-08-16 DIAGNOSIS — E559 Vitamin D deficiency, unspecified: Secondary | ICD-10-CM | POA: Diagnosis not present

## 2018-08-16 DIAGNOSIS — M810 Age-related osteoporosis without current pathological fracture: Secondary | ICD-10-CM | POA: Diagnosis not present

## 2018-08-16 DIAGNOSIS — Z6826 Body mass index (BMI) 26.0-26.9, adult: Secondary | ICD-10-CM | POA: Diagnosis not present

## 2018-08-16 DIAGNOSIS — Z8739 Personal history of other diseases of the musculoskeletal system and connective tissue: Secondary | ICD-10-CM | POA: Diagnosis not present

## 2018-08-16 DIAGNOSIS — Z840 Family history of diseases of the skin and subcutaneous tissue: Secondary | ICD-10-CM | POA: Diagnosis not present

## 2018-08-16 DIAGNOSIS — R768 Other specified abnormal immunological findings in serum: Secondary | ICD-10-CM | POA: Diagnosis not present

## 2018-08-16 DIAGNOSIS — M0600 Rheumatoid arthritis without rheumatoid factor, unspecified site: Secondary | ICD-10-CM | POA: Diagnosis not present

## 2018-08-17 DIAGNOSIS — Z23 Encounter for immunization: Secondary | ICD-10-CM | POA: Diagnosis not present

## 2018-08-25 ENCOUNTER — Other Ambulatory Visit: Payer: Self-pay | Admitting: Internal Medicine

## 2018-09-07 DIAGNOSIS — R05 Cough: Secondary | ICD-10-CM | POA: Diagnosis not present

## 2018-09-13 ENCOUNTER — Other Ambulatory Visit: Payer: Self-pay

## 2018-09-23 ENCOUNTER — Ambulatory Visit: Payer: Medicare Other

## 2018-09-27 ENCOUNTER — Ambulatory Visit: Payer: Medicare Other

## 2018-10-06 ENCOUNTER — Ambulatory Visit (INDEPENDENT_AMBULATORY_CARE_PROVIDER_SITE_OTHER): Payer: Medicare Other

## 2018-10-06 VITALS — BP 118/64 | HR 68 | Temp 97.5°F | Resp 16 | Ht 62.5 in | Wt 142.8 lb

## 2018-10-06 DIAGNOSIS — Z0000 Encounter for general adult medical examination without abnormal findings: Secondary | ICD-10-CM | POA: Diagnosis not present

## 2018-10-06 NOTE — Patient Instructions (Addendum)
  [name] , Thank you for taking time to come for your Medicare Wellness Visit. I appreciate your ongoing commitment to your health goals. Please review the following plan we discussed and let me know if I can assist you in the future.   Keep all routine scheduled appointments.   These are the goals we discussed: Goals      Patient Stated   . DIET - INCREASE WATER INTAKE (pt-stated)     Stay hydrated       This is a list of the screening recommended for you and due dates:  Health Maintenance  Topic Date Due  . Flu Shot  05/26/2018  . Tetanus Vaccine  11/28/2025  . DEXA scan (bone density measurement)  Completed  . Pneumonia vaccines  Completed

## 2018-10-06 NOTE — Progress Notes (Addendum)
Subjective:   [name] is a 84 y.o. female who presents for Medicare Annual (Subsequent) preventive examination.  Review of Systems:  No ROS.  Medicare Wellness Visit. Additional risk factors are reflected in the social history. Cardiac Risk Factors include: advanced age (>35men, >54 women);hypertension     Objective:     Vitals: BP 118/64 (BP Location: Left Arm, Patient Position: Sitting, Cuff Size: Normal)   Pulse 68   Temp (!) 97.5 F (36.4 C) (Oral)   Resp 16   Ht 5' 2.5" (1.588 m)   Wt 142 lb 12.8 oz (64.8 kg)   SpO2 96%   BMI 25.70 kg/m   Body mass index is 25.7 kg/m.  Advanced Directives 10/06/2018 09/22/2017 12/22/2016 09/22/2016 04/23/2016 04/15/2016 07/04/2013  Does Patient Have a Medical Advance Directive? Yes Yes Yes Yes Yes Yes Patient has advance directive, copy not in chart  Type of Advance Directive Living will;Healthcare Power of [place];Living will Healthcare Power of [place];Living will - [place];Living will -  Does patient want to make changes to medical advance directive? No - Patient declined No - Patient declined - - - No - Patient declined -  Copy of [place] in Chart? No - copy requested No - copy requested - No - copy requested - No - copy requested -    Tobacco Social History   Tobacco Use  Smoking Status Never Smoker  Smokeless Tobacco Never Used     Counseling given: Not Answered   Clinical Intake:  Pre-visit preparation completed: Yes  Pain : No/denies pain     Nutritional Status: BMI 25 -29 Overweight Diabetes: No  How often do you need to have someone help you when you read instructions, pamphlets, or other written materials from your doctor or pharmacy?: 2 - Rarely  Interpreter Needed?: No     Past Medical History:  Diagnosis Date  . Arthritis   . Carpal tunnel syndrome   . 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 Hx of TIA (transient ischemic attack) and stroke   . Hypertension   . Hypothyroidism   . Osteoporosis   . Wears glasses   . Wears hearing aid    Past Surgical History:  Procedure Laterality Date  . APPENDECTOMY    . CARPAL TUNNEL RELEASE Left 07/11/2013   Procedure: CARPAL TUNNEL RELEASE;  Surgeon: Cammie Sickle., MD;  Location: [facility];  Service: Orthopedics;  Laterality: Left;  . COLONOSCOPY    . SUBDURAL HEMATOMA EVACUATION VIA CRANIOTOMY  2007  . TONSILLECTOMY     Family History  Problem Relation Age of Onset  . Cancer Mother 78       breast ca  . Cancer Maternal Aunt   . Cancer Brother        pancreatic ca  . Breast cancer Sister   . Brain cancer Brother        astrocytoma?  . Kidney cancer Sister    Social History   Socioeconomic History  . Marital status: Widowed    Spouse name: Not on file  . Number of children: Not on file  . Years of education: Not on file  . Highest education level: Not on file  Occupational History  . Not on file  Social Needs  . Financial resource strain: Not hard at all  . Food insecurity:    Worry: Never true  Inability: Never true  . Transportation needs:    Medical: No    Non-medical: No  Tobacco Use  . Smoking status: Never Smoker  . Smokeless tobacco: Never Used  Substance and Sexual Activity  . Alcohol use: Yes    Comment: occ  . Drug use: No  . Sexual activity: Not on file  Lifestyle  . Physical activity:    Days per week: Not on file    Minutes per session: Not on file  . Stress: Not on file  Relationships  . Social connections:    Talks on phone: Not on file    Gets together: Not on file    Attends religious service: Not on file    Active member of club or organization: Not on file    Attends meetings of clubs or organizations: Not on file    Relationship status: Not on file  Other Topics Concern  . Not on file  Social History Narrative   Lives independently sons and 4  daughters live nearby and are supportive     Outpatient Encounter Medications as of 10/06/2018  Medication Sig  . aspirin EC 81 MG tablet Take 81 mg by mouth every evening.  . cholecalciferol (VITAMIN D) 1000 units tablet Take 1,000 Units by mouth 2 (two) times daily.  . diphenhydrAMINE (BENADRYL ALLERGY) 25 MG tablet Take 25 mg by mouth at bedtime. Pt states that she takes 1/2 tablet nightly.  [name] FOLIC ACID PO Take by mouth.  . levothyroxine (SYNTHROID, LEVOTHROID) 100 MCG tablet TAKE 1 TABLET BY MOUTH ONCE DAILY ON AN EMPTY STOMACH. WAIT 30 MINUTES BEFORE TAKING OTHER MEDS.  [name] loratadine (ALLERGY) 10 MG tablet Take 10 mg by mouth daily.  [name] losartan (COZAAR) 100 MG tablet TAKE 1 TABLET BY MOUTH ONCE DAILY  . Methotrexate Sodium (TREXALL PO) Take by mouth.  . metoprolol succinate (TOPROL-XL) 50 MG 24 hr tablet TAKE 1 TABLET BY MOUTH ONCE EVERY EVENING  . polyethylene glycol (MIRALAX / GLYCOLAX) packet Take 17 g by mouth daily.  . predniSONE (DELTASONE) 1 MG tablet Take 3 mg by mouth every other day.   . [DISCONTINUED] predniSONE (DELTASONE) 10 MG tablet 6 tablets on Day 1 , then reduce by 1 tablet daily until gone   No facility-administered encounter medications on file as of 10/06/2018.     Activities of Daily Living In your present state of health, do you have any difficulty performing the following activities: 10/06/2018  Hearing? Y  Vision? N  Difficulty concentrating or making decisions? Y  Comment Notes some difficulty remembering  Walking or climbing stairs? Y  Comment Slight gait unbalance  Dressing or bathing? N  Doing errands, shopping? N  Preparing Food and eating ? N  Using the Toilet? N  In the past six months, have you accidently leaked urine? N  Do you have problems with loss of bowel control? Y  Comment Managed with daily pad  Managing your Medications? N  Managing your Finances? N  Housekeeping or managing your Housekeeping? N  Some recent data might be hidden      Patient Care Team: Crecencio Mc, MD as PCP - General (Internal Medicine)    Assessment:   This is a routine wellness examination for [name].  She is considering selling her home and moving to [place] in [place] shortly after the new year.  Plans to discuss with her family and see primary care provider before making final decision.   Next scheduled  appointment 11/15/18. Keep all routine scheduled appointments.   Health Screenings  Mammogram-06/19/13 Bone Density-04/20/17 Glaucoma-none reported Hearing-wears hearing aid  Dental-every 6 months  Social  Alcohol intake-yes, 2-3 per week  Smoking history- none Smokers in home?none Illicit drug use?no Oncologist  Patient feesl safe at [employer] Patient does have smoke detectors at [employer] Patient does wear sunscreen or protective clothing when in direct sunlight-yes Patient does wear seat belt when driving or riding with others-yes  Activities of Daily Living Patient can do their own household chores. Denies needing assistance with: driving, feeding themselves, getting from bed to chair, getting to the toilet, bathing/showering, dressing, managing money, climbing flight of stairs, or preparing meals.  Ambulates with walking stick when walking in the woods behind her home.  She keeps her cell phone with her while outdoors.   Depression Screen Patient denies losing interest in daily life, feeling hopeless, or crying easily over simple problems.   Fall Screen Patient denies being afraid of falling or falling in the last year.   Memory Screen Patient denies problems with memory, misplacing items, and is able to balance checkbook/bank accounts.  Patient is alert, normal appearance, oriented to person/place/and time. Correctly identified the president of the [place], recall of 3 objects, and performing simple calculations.  Patient displays appropriate judgement and can read  correct time from watch face.   Immunizations The following Immunizations are up to date: Influenza, shingles, pneumonia, and tetanus.   Other Providers Patient Care Team: Crecencio Mc, MD as PCP - General (Internal Medicine)  Exercise Activities and Dietary recommendations Current Exercise Habits: Home exercise routine, Type of exercise: walking, Time (Minutes): 20, Frequency (Times/Week): 3, Weekly Exercise (Minutes/Week): 60, Intensity: Mild  Goals      Patient Stated   . DIET - INCREASE WATER INTAKE (pt-stated)     Stay hydrated       Fall Risk Fall Risk  10/06/2018 09/22/2017 04/14/2017 09/22/2016 06/23/2016  Falls in the past year? 0 No No No No  Comment - - - - Emmi Telephone Survey: data to providers prior to load   Depression Screen PHQ 2/9 Scores 10/06/2018 09/22/2017 04/14/2017 09/22/2016  PHQ - 2 Score 0 0 0 0  PHQ- 9 Score - 0 - -     Cognitive Function MMSE - Mini Mental State Exam 09/22/2017  Not completed: Refused     6CIT Screen 10/06/2018 09/22/2016  What Year? 0 points 0 points  What month? 0 points 0 points  What time? 0 points 0 points  Count back from 20 0 points 0 points  Months in reverse 0 points 0 points  Repeat phrase - 0 points  Total Score - 0    Immunization History  Administered Date(s) Administered  . Influenza, High Dose Seasonal PF 07/25/2015  . Influenza-Unspecified 08/07/2014, 07/27/2016, 07/23/2017  . Pneumococcal Conjugate-13 09/07/2014  . Pneumococcal Polysaccharide-23 09/07/2005, 09/07/2010  . Tdap 11/29/2015  . Zoster 01/20/2005  . Zoster Recombinat (Shingrix) 06/09/2018, 09/02/2018   Screening Tests Health Maintenance  Topic Date Due  . INFLUENZA VACCINE  05/26/2018  . TETANUS/TDAP  11/28/2025  . DEXA SCAN  Completed  . PNA vac Low Risk Adult  Completed      Plan:    End of life planning; Advance aging; Advanced directives discussed. Copy of current HCPOA/Living Will requested.    Next scheduled  appointment 11/15/18. Keep all routine scheduled appointments.   I have personally reviewed and noted the following in the patient's chart:   .  Medical and social history . Use of alcohol, tobacco or illicit drugs  . Current medications and supplements . Functional ability and status . Nutritional status . Physical activity . Advanced directives . List of other physicians . Hospitalizations, surgeries, and ER visits in previous 12 months . Vitals . Screenings to include cognitive, depression, and falls . Referrals and appointments  In addition, I have reviewed and discussed with patient certain preventive protocols, quality metrics, and best practice recommendations. A written personalized care plan for preventive services as well as general preventive health recommendations were provided to patient.     OBrien-Blaney, Ondine Gemme L, LPN  [id number]/[id number]    I have reviewed the above information and agree with above.   Deborra Medina, MD

## 2018-10-22 ENCOUNTER — Other Ambulatory Visit: Payer: Self-pay | Admitting: Internal Medicine

## 2018-11-07 DIAGNOSIS — E559 Vitamin D deficiency, unspecified: Secondary | ICD-10-CM | POA: Diagnosis not present

## 2018-11-07 DIAGNOSIS — R768 Other specified abnormal immunological findings in serum: Secondary | ICD-10-CM | POA: Diagnosis not present

## 2018-11-07 DIAGNOSIS — Z8739 Personal history of other diseases of the musculoskeletal system and connective tissue: Secondary | ICD-10-CM | POA: Diagnosis not present

## 2018-11-07 DIAGNOSIS — M0600 Rheumatoid arthritis without rheumatoid factor, unspecified site: Secondary | ICD-10-CM | POA: Diagnosis not present

## 2018-11-07 DIAGNOSIS — Z840 Family history of diseases of the skin and subcutaneous tissue: Secondary | ICD-10-CM | POA: Diagnosis not present

## 2018-11-07 DIAGNOSIS — M810 Age-related osteoporosis without current pathological fracture: Secondary | ICD-10-CM | POA: Diagnosis not present

## 2018-11-07 DIAGNOSIS — E663 Overweight: Secondary | ICD-10-CM | POA: Diagnosis not present

## 2018-11-07 DIAGNOSIS — Z6825 Body mass index (BMI) 25.0-25.9, adult: Secondary | ICD-10-CM | POA: Diagnosis not present

## 2018-11-08 ENCOUNTER — Encounter: Payer: Self-pay | Admitting: Gastroenterology

## 2018-11-08 ENCOUNTER — Encounter: Payer: Self-pay | Admitting: Physician Assistant

## 2018-11-08 ENCOUNTER — Ambulatory Visit (INDEPENDENT_AMBULATORY_CARE_PROVIDER_SITE_OTHER): Payer: Medicare Other | Admitting: Gastroenterology

## 2018-11-08 VITALS — BP 158/84 | HR 70 | Ht 62.0 in | Wt 141.6 lb

## 2018-11-08 DIAGNOSIS — R195 Other fecal abnormalities: Secondary | ICD-10-CM | POA: Diagnosis not present

## 2018-11-08 NOTE — Progress Notes (Addendum)
Mary Antigua, MD [address]  [place]  [place], [place] [address]  Main: [phone]  Fax: [phone]   Primary Care Physician: Crecencio Mc, MD   Chief Complaint  Patient presents with  . Follow-up    CONSTIPATION    HPI: [name] is a 85 y.o. female here for follow-up of constipation.  Now patient is reporting that the constipation has resolved 3 to 4 weeks she has had intermittent loose stools.  She reports having loose stools without blood for 2 to 3 days and then this will resolve and then recur again weeks.  No hematochezia or melena.  No abdominal pain.  No nausea or vomiting.  No weight loss or appetite loss.  No exacerbating foods.  She states she mentioned this to her rheumatologist and they have discontinued her methotrexate to see if her symptoms are coming from that.  Her symptoms have resolved for 1 week.  For the last week, she either has no bowel movements or only 1-2 formed bowel movements a day.  She stopped taking her MiraLAX as well.  Her last colonoscopy was in 2005 by Dr. Bary Castilla for screening.  She was 76 at the time.  It is reported to be normal and repeat recommended in 10 years.  On my review of the images, there are only 2 images available in the colonoscopy report 1 of the terminal ileum and 1 of the cecum.  The cecal image does show some stool, and there are no comments on the procedure report itself of the prep during the colonoscopy.  Current Outpatient Medications  Medication Sig Dispense Refill  . aspirin EC 81 MG tablet Take 81 mg by mouth every evening.    . cholecalciferol (VITAMIN D) 1000 units tablet Take 1,000 Units by mouth 2 (two) times daily.    . diphenhydrAMINE (BENADRYL ALLERGY) 25 MG tablet Take 25 mg by mouth at bedtime. Pt states that she takes 1/2 tablet nightly.    . levothyroxine (SYNTHROID, LEVOTHROID) 100 MCG tablet TAKE 1 TABLET BY MOUTH ONCE DAILY ON AN EMPTY STOMACH. WAIT 30 MINUTES BEFORE TAKING OTHER  MEDS. 90 tablet 1  . losartan (COZAAR) 100 MG tablet TAKE 1 TABLET BY MOUTH ONCE DAILY 90 tablet 1  . metoprolol succinate (TOPROL-XL) 50 MG 24 hr tablet TAKE 1 TABLET BY MOUTH ONCE EVERY EVENING 90 tablet 3  . predniSONE (DELTASONE) 1 MG tablet Take 3 mg by mouth every other day.     [name] FOLIC ACID PO Take by mouth.    . loratadine (ALLERGY) 10 MG tablet Take 10 mg by mouth daily.    . Methotrexate Sodium (TREXALL PO) Take by mouth.     No current facility-administered medications for this visit.     Allergies as of 11/08/2018 - Review Complete 11/08/2018  Allergen Reaction Noted  . Pollen extract    . Levofloxacin Anxiety 04/25/2018    ROS:  General: Negative for anorexia, weight loss, fever, chills, fatigue, weakness. ENT: Negative for hoarseness, difficulty swallowing , nasal congestion. CV: Negative for chest pain, angina, palpitations, dyspnea on exertion, peripheral edema.  Respiratory: Negative for dyspnea at rest, dyspnea on exertion, cough, sputum, wheezing.  GI: See history of present illness. GU:  Negative for dysuria, hematuria, urinary incontinence, urinary frequency, nocturnal urination.  Endo: Negative for unusual weight change.    Physical Examination:   BP (!) 158/84 (BP Location: Left Arm, Patient Position: Sitting, Cuff Size: Normal)   Pulse 70  Ht 5\' 2"  (1.575 m)   Wt 141 lb 9.6 oz (64.2 kg)   BMI 25.90 kg/m   General: Well-nourished, well-developed in no acute distress.  Eyes: No icterus. Conjunctivae pink. Mouth: Oropharyngeal mucosa moist and pink , no lesions erythema or exudate. Neck: Supple, Trachea midline Abdomen: Bowel sounds are normal, nontender, nondistended, no hepatosplenomegaly or masses, no abdominal bruits or hernia , no rebound or guarding.   Extremities: No lower extremity edema. No clubbing or deformities. Neuro: Alert and oriented x 3.  Grossly intact. Skin: Warm and dry, no jaundice.   Psych: Alert and cooperative, normal mood and  affect.   Labs: CMP     Component Value Date/Time   NA 136 08/12/2018 0846   K 4.4 08/12/2018 0846   CL 100 08/12/2018 0846   CO2 29 08/12/2018 0846   GLUCOSE 83 08/12/2018 0846   BUN 20 08/12/2018 0846   CREATININE 0.90 08/12/2018 0846   CALCIUM 9.3 08/12/2018 0846   PROT 7.0 08/12/2018 0846   ALBUMIN 4.1 08/12/2018 0846   AST 17 08/12/2018 0846   ALT 16 08/12/2018 0846   ALKPHOS 60 08/12/2018 0846   BILITOT 0.4 08/12/2018 0846   GFRNONAA 58 (L) 04/14/2016 1818   GFRAA >60 04/14/2016 1818   Lab Results  Component Value Date   WBC 7.5 08/12/2018   HGB 13.6 08/12/2018   HCT 40.4 08/12/2018   MCV 95.9 08/12/2018   PLT 284.0 08/12/2018    Imaging Studies: No results found.  Assessment and Plan:   [name] is a 85 y.o. y/o female with history of constipation here for follow-up  Constipation has resolved and patient reports having intermittent loose stools for the last 6 weeks and does has held her MiraLAX Loose stools have also resolved over the last 1 week Symptoms may have been due to gastroenteritis or medication side effect Since symptoms have resolved, no indication for stool studies at this time  I have asked her to discontinue her MiraLAX and we can restart this or Metamucil in 1 to 2 months depending on her bowel movements. Follow-up with rheumatology in regard to her methotrexate  I did discuss with her that I reviewed the images from her last colonoscopy.  I discussed that there is a possibility the prep on that colonoscopy may not have been enough to rule out small polyps or lesions.  However, I cannot definitively comment on the quality of the prep since it is not mentioned in the report and there are only 2 images on the report that I can see.  We discussed that we can proceed with a colonoscopy if she is agreeable which would allow [place] to evaluate for any colon lesions such as polyps, and also obtain biopsies for microscopic colitis given her  intermittent loose stools.  Patient would like to hold off on colonoscopy at this time since symptoms have resolved for the last 1 week and if symptoms recur she states she will call us and schedule a colonoscopy at that time.  Clinic follow-up in 4 to 6 weeks to reassess symptoms  Addendum: Lab work obtained from November 07, 2018 done by her primary care provider.  Hemoglobin is normal at 12.5.  Normal MCV 95.  Normal platelets.  Normal white count.  CMP shows normal liver enzymes.  Therefore reassuring labs.  Dr [name]

## 2018-11-15 ENCOUNTER — Encounter: Payer: Self-pay | Admitting: Internal Medicine

## 2018-11-15 ENCOUNTER — Ambulatory Visit (INDEPENDENT_AMBULATORY_CARE_PROVIDER_SITE_OTHER): Payer: Medicare Other | Admitting: Internal Medicine

## 2018-11-15 ENCOUNTER — Telehealth: Payer: Self-pay | Admitting: Internal Medicine

## 2018-11-15 DIAGNOSIS — E039 Hypothyroidism, unspecified: Secondary | ICD-10-CM

## 2018-11-15 DIAGNOSIS — T380X5A Adverse effect of glucocorticoids and synthetic analogues, initial encounter: Secondary | ICD-10-CM

## 2018-11-15 DIAGNOSIS — M818 Other osteoporosis without current pathological fracture: Secondary | ICD-10-CM

## 2018-11-15 DIAGNOSIS — M0600 Rheumatoid arthritis without rheumatoid factor, unspecified site: Secondary | ICD-10-CM | POA: Diagnosis not present

## 2018-11-15 DIAGNOSIS — I10 Essential (primary) hypertension: Secondary | ICD-10-CM | POA: Diagnosis not present

## 2018-11-15 NOTE — Patient Instructions (Addendum)
Vitamin D INTAKE :  Take 1000 Ius daily during  May through October  and 2000 Ius daily from November through April ,      I recommend getting the majority of your calcium and Vitamin D  through diet rather than supplements given the recent association of calcium supplements with increased coronary artery calcium scores  You need about 1800 mg daily (try to limit supplements to 1 600 mg calcium pill and get the rest through diet )    Your next Prolia injection is due in mid February.  You should receive a call from [facility] by the second week. Of February.  If you do not.  Please call the office.  Your next bone density test is due in late June.  It has been ordered.   Your form will be completed and sent to [facility] for you

## 2018-11-15 NOTE — Assessment & Plan Note (Signed)
last Prolia injection was done in August 2019.

## 2018-11-15 NOTE — Progress Notes (Signed)
Subjective:  Patient ID: [name], female    DOB: 1926-11-07  Age: 85 y.o. MRN: [id number]  CC: Diagnoses of Steroid-induced osteoporosis, Seronegative rheumatoid arthritis ([place]), Essential hypertension, and Acquired hypothyroidism were pertinent to this visit.  HPI [name] presents for follow up on chronic conditions including  Hypothyroidism constipation and hypertension   Patient is preparing to move to [facility] in [place] she had a cash offer on her home.  Needs a form signed   LABS NORMAL Nov 07 2018   RA:  MTX stopped due to loose stools.  Med was stopped ten days ago and diarrhea has resoved  So far no joint pain . Stopped folic acid too. [name]   Discussed the current controversies surrounding the risks and benefits of calcium supplementation.  Encouraged her to increase dietary calcium through natural foods including soy and cow's milk   Saw GI last month for loose stools).  Seen previously for constipation  By same    Outpatient Medications Prior to Visit  Medication Sig Dispense Refill  . aspirin EC 81 MG tablet Take 81 mg by mouth every evening.    . cholecalciferol (VITAMIN D) 1000 units tablet Take 1,000 Units by mouth 2 (two) times daily.    . diphenhydrAMINE (BENADRYL ALLERGY) 25 MG tablet Take 25 mg by mouth at bedtime. Pt states that she takes 1/2 tablet nightly.    . levothyroxine (SYNTHROID, LEVOTHROID) 100 MCG tablet TAKE 1 TABLET BY MOUTH ONCE DAILY ON AN EMPTY STOMACH. WAIT 30 MINUTES BEFORE TAKING OTHER MEDS. 90 tablet 1  . losartan (COZAAR) 100 MG tablet TAKE 1 TABLET BY MOUTH ONCE DAILY 90 tablet 1  . metoprolol succinate (TOPROL-XL) 50 MG 24 hr tablet TAKE 1 TABLET BY MOUTH ONCE EVERY EVENING 90 tablet 3  . predniSONE (DELTASONE) 1 MG tablet Take 3 mg by mouth every other day.     [name] FOLIC ACID PO Take by mouth.    . loratadine (ALLERGY) 10 MG tablet Take 10 mg by mouth daily.    . Methotrexate Sodium (TREXALL PO) Take by mouth.     No  facility-administered medications prior to visit.     Review of Systems;  Patient denies headache, fevers, malaise, unintentional weight loss, skin rash, eye pain, sinus congestion and sinus pain, sore throat, dysphagia,  hemoptysis , cough, dyspnea, wheezing, chest pain, palpitations, orthopnea, edema, abdominal pain, nausea, melena, diarrhea, constipation, flank pain, dysuria, hematuria, urinary  Frequency, nocturia, numbness, tingling, seizures,  Focal weakness, Loss of consciousness,  Tremor, insomnia, depression, anxiety, and suicidal ideation.      Objective:  BP 126/68 (BP Location: Left Arm, Patient Position: Sitting, Cuff Size: Normal)   Pulse (!) 105   Temp (!) 97.5 F (36.4 C) (Oral)   Resp 15   Ht 5\' 2"  (1.575 m)   Wt 142 lb 6.4 oz (64.6 kg)   SpO2 96%   BMI 26.05 kg/m   BP Readings from Last 3 Encounters:  11/15/18 126/68  11/08/18 (!) 158/84  10/06/18 118/64    Wt Readings from Last 3 Encounters:  11/15/18 142 lb 6.4 oz (64.6 kg)  11/08/18 141 lb 9.6 oz (64.2 kg)  10/06/18 142 lb 12.8 oz (64.8 kg)    General appearance: alert, cooperative and appears stated age Ears: normal TM's and external ear canals both ears Throat: lips, mucosa, and tongue normal; teeth and gums normal Neck: no adenopathy, no carotid bruit, supple, symmetrical, trachea midline and thyroid not enlarged,  symmetric, no tenderness/mass/nodules Back: symmetric, no curvature. ROM normal. No CVA tenderness. Lungs: clear to auscultation bilaterally Heart: regular rate and rhythm, S1, S2 normal, no murmur, click, rub or gallop Abdomen: soft, non-tender; bowel sounds normal; no masses,  no organomegaly Pulses: 2+ and symmetric Skin: Skin color, texture, turgor normal. No rashes or lesions Lymph nodes: Cervical, supraclavicular, and axillary nodes normal.  Lab Results  Component Value Date   HGBA1C 5.8 09/13/2015   HGBA1C 6.0 03/05/2015    Lab Results  Component Value Date   CREATININE  0.90 08/12/2018   CREATININE 0.86 09/22/2017   CREATININE 0.99 11/09/2016    Lab Results  Component Value Date   WBC 7.5 08/12/2018   HGB 13.6 08/12/2018   HCT 40.4 08/12/2018   PLT 284.0 08/12/2018   GLUCOSE 83 08/12/2018   CHOL 257 (H) 11/05/2016   TRIG 146.0 11/05/2016   HDL 55.90 11/05/2016   LDLDIRECT 78.0 06/10/2016   LDLCALC 172 (H) 11/05/2016   ALT 16 08/12/2018   AST 17 08/12/2018   NA 136 08/12/2018   K 4.4 08/12/2018   CL 100 08/12/2018   CREATININE 0.90 08/12/2018   BUN 20 08/12/2018   CO2 29 08/12/2018   TSH 0.78 03/30/2018   INR 0.96 04/14/2016   HGBA1C 5.8 09/13/2015    Dg Bone Density (dxa)  Result Date: 04/20/2017 EXAM: DUAL X-RAY ABSORPTIOMETRY (DXA) FOR BONE MINERAL DENSITY IMPRESSION: Dear Dr Marella Chimes, Your patient Aixa Corsello completed a BMD test on 04/20/2017 using the [place] (analysis version: 14.10) manufactured by [employer]. The following summarizes the results of our evaluation. PATIENT BIOGRAPHICAL: Name: Mary, Vargas Patient ID: [id number] Birth Date: 09/10/1927 Height: 62.5 in. Gender: Female Exam Date: 04/20/2017 Weight: 140.8 lbs. Indications: Advanced Age, Caucasian, Family Hx of Osteoporosis, Low Body Weight, Osteoporotic, POSTmenopausal, Rheumatoid Arthritis Fractures: Treatments: 81 MG ASPIRIN, levothyroxin, prolia, Vitamin D ASSESSMENT: The BMD measured at Femur Neck Left is 0.677 g/cm2 with a T-score of -2.6. This patient is considered OSTEOPOROTIC according to [facility]) criteria. L1 was excluded due to degenerative changes. Patient is not a candidate for FRAX assessment due to osteoporosis report and Prolia use. Site Region Measured Measured WHO Young Adult BMD Date       Age      Classification T-score DualFemur Neck Left 04/20/2017 88.0 Osteoporosis -2.6 0.677 g/cm2 AP Spine L2-L4 04/20/2017 88.0 Osteopenia -1.9 0.976 g/cm2 World Health Organization [facility]) criteria for post-menopausal,  Caucasian Women: Normal:       T-score at or above -1 SD Osteopenia:   T-score between -1 and -2.5 SD Osteoporosis: T-score at or below -2.5 SD RECOMMENDATIONS: [place] recommends that FDA-approved medical therapies be considered in postemenopausal women and men age 61 or older with a: 1. Hip or vertebral (clinical or morphometric) fracture. 2. T-score of < -2.5at the spine or hip. 3. Ten-year fracture probability by FRAX of 3% or greater for hip fracture or 20% or greater for major osteoporotic fracture. All treatment decisions require clinical judgment and consideration of individual patient factors, including patient preferences, co-morbidities, previous drug use, risk factors not captured in the FRAX model (e.g. falls, vitamin D deficiency, increased bone turnover, interval significant decline in bone density) and possible under - or over-estimation of fracture risk by FRAX. All patients should ensure an adequate intake of dietary calcium (1200 mg/d) and vitamin D (800 IU daily) unless contraindicated. FOLLOW-UP: People with diagnosed cases of osteoporosis or at high risk for  fracture should have regular bone mineral density tests. For patients eligible for Medicare, routine testing is allowed once every 2 years. The testing frequency can be increased to one year for patients who have rapidly progressing disease, those who are receiving or discontinuing medical therapy to restore bone mass, or have additional risk factors. I have reviewed this report, and agree with the above findings. Mark A. Thornton Papas, M.D. [facility] Radiology Electronically Signed   By: Lavonia Dana M.D.   On: 04/20/2017 10:05    Assessment & Plan:   Problem List Items Addressed This Visit    Acquired hypothyroidism    Thyroid function is WNL on current dose.  No current changes needed.   Lab Results  Component Value Date   TSH 0.78 03/30/2018         Essential hypertension    Well controlled on current  regimen. Renal function stable, no changes today.  Lab Results  Component Value Date   CREATININE 0.90 08/12/2018   Lab Results  Component Value Date   NA 136 08/12/2018   K 4.4 08/12/2018   CL 100 08/12/2018   CO2 29 08/12/2018         Seronegative rheumatoid arthritis ([place])    Now off of MTX due to persistent diarrhea.      Steroid-induced osteoporosis    last Prolia injection was done in August 2019.         Relevant Orders   DG Bone Density    A total of 25 minutes of face to face time was spent with patient more than half of which was spent in counselling about the above mentioned conditions  and coordination of care   I have discontinued Junell W. Criswell's Methotrexate Sodium (TREXALL PO), FOLIC ACID PO, and loratadine. I am also having her maintain her aspirin EC, cholecalciferol, predniSONE, diphenhydrAMINE, levothyroxine, metoprolol succinate, and losartan.  No orders of the defined types were placed in this encounter.   Medications Discontinued During This Encounter  Medication Reason  . FOLIC ACID PO Patient Preference  . loratadine (ALLERGY) 10 MG tablet Patient Preference  . Methotrexate Sodium (TREXALL PO) Patient has not taken in last 30 days    Follow-up: No follow-ups on file.   Crecencio Mc, MD

## 2018-11-15 NOTE — Telephone Encounter (Signed)
It appears that she has to have a TB test before I can complete the intake form for her transition to  [facility].  Please call her asap and set her up  For a PPD .  The incomplete Form will be in in red folder. The charge is $50

## 2018-11-16 NOTE — Assessment & Plan Note (Signed)
Well controlled on current regimen. Renal function stable, no changes today.  Lab Results  Component Value Date   CREATININE 0.90 08/12/2018   Lab Results  Component Value Date   NA 136 08/12/2018   K 4.4 08/12/2018   CL 100 08/12/2018   CO2 29 08/12/2018

## 2018-11-16 NOTE — Assessment & Plan Note (Signed)
Thyroid function is WNL on current dose.  No current changes needed.   Lab Results  Component Value Date   TSH 0.78 03/30/2018

## 2018-11-16 NOTE — Assessment & Plan Note (Signed)
Now off of MTX due to persistent diarrhea.

## 2018-11-17 NOTE — Telephone Encounter (Signed)
Spoke with pt and she stated that she wants to call the facility and make sure that this is definitely necessary because she stated that the lady she spoke to wasn't completely sure that the pt had to have everything completed on the form. Pt stated that she would call back to let us know. Please transfer pt to our office.

## 2018-11-17 NOTE — Telephone Encounter (Signed)
Patient called back & wanted to go ahead to get scheduled for PPD just incase. She had not spoken with facility, but wanted to have it if it were to be needed. Patient was scheduled for next Wednesday 11/23/18 nurse visit, which was first available.

## 2018-11-23 ENCOUNTER — Ambulatory Visit (INDEPENDENT_AMBULATORY_CARE_PROVIDER_SITE_OTHER): Payer: Medicare Other | Admitting: Lab

## 2018-11-23 ENCOUNTER — Telehealth: Payer: Self-pay | Admitting: Internal Medicine

## 2018-11-23 DIAGNOSIS — Z23 Encounter for immunization: Secondary | ICD-10-CM | POA: Diagnosis not present

## 2018-11-23 MED ORDER — TUBERCULIN PPD 5 UNIT/0.1ML ID SOLN
5.0000 [IU] | Freq: Once | INTRADERMAL | Status: AC
  Administered 2018-11-23: 5 [IU] via INTRADERMAL

## 2018-11-23 NOTE — Telephone Encounter (Signed)
Patient dropped off a DNR order during a RN visit,  I cannot sign this because I have not had this discussion with her to confirm that she understands what this is for.  If she would like to come by Thursday after my last morning appointment,  You can schedule her  For  A 15 minute urgent slot

## 2018-11-23 NOTE — Telephone Encounter (Signed)
please move it to 12:15  .  I have a doctor's appt at 1:15

## 2018-11-23 NOTE — Progress Notes (Signed)
Pt here today for PPD test. Placed in Pt left fore arm. Pt tolerated well and was told to come back 11/25/2018 for reading of PPD.

## 2018-11-23 NOTE — Telephone Encounter (Signed)
Called Pt, and Pt will be here tomorrow at 12:30

## 2018-11-24 ENCOUNTER — Encounter: Payer: Self-pay | Admitting: Internal Medicine

## 2018-11-24 ENCOUNTER — Ambulatory Visit (INDEPENDENT_AMBULATORY_CARE_PROVIDER_SITE_OTHER): Payer: Medicare Other | Admitting: Internal Medicine

## 2018-11-24 DIAGNOSIS — Z7189 Other specified counseling: Secondary | ICD-10-CM

## 2018-11-24 NOTE — Telephone Encounter (Signed)
LMTCB. Need to see if pt is able to come in at 12:15 today. PEC may speak with pt.

## 2018-11-24 NOTE — Telephone Encounter (Signed)
Spoke with pt and she stated that she will be here by 12:15.

## 2018-11-24 NOTE — Progress Notes (Signed)
Subjective:  Patient ID: [name], female    DOB: 10-30-1926  Age: 85 y.o. MRN: [id number]  CC: The encounter diagnosis was Counseling regarding end of life decision making.  HPI [name] presents for discussion of DNR and MOST orders.  Patient is preparing to move from a private residence to a assisted living community in [place] and dropped off a form requesting to be signed  During the course of the visit , End of Life objectives were discussed at length,  Patient does  have a living will in place and a healthcare power of attorney.    DNR status has been requested by patient after risks and benefits of the order were discussed today .  MOST form was completed and signed as well. Order given to patient and placed in chart.   Outpatient Medications Prior to Visit  Medication Sig Dispense Refill  . aspirin EC 81 MG tablet Take 81 mg by mouth every evening.    . cholecalciferol (VITAMIN D) 1000 units tablet Take 1,000 Units by mouth 2 (two) times daily.    . diphenhydrAMINE (BENADRYL ALLERGY) 25 MG tablet Take 25 mg by mouth at bedtime. Pt states that she takes 1/2 tablet nightly.    . levothyroxine (SYNTHROID, LEVOTHROID) 100 MCG tablet TAKE 1 TABLET BY MOUTH ONCE DAILY ON AN EMPTY STOMACH. WAIT 30 MINUTES BEFORE TAKING OTHER MEDS. 90 tablet 1  . losartan (COZAAR) 100 MG tablet TAKE 1 TABLET BY MOUTH ONCE DAILY 90 tablet 1  . metoprolol succinate (TOPROL-XL) 50 MG 24 hr tablet TAKE 1 TABLET BY MOUTH ONCE EVERY EVENING 90 tablet 3  . predniSONE (DELTASONE) 1 MG tablet Take 3 mg by mouth every other day.     . tuberculin injection 5 Units      No facility-administered medications prior to visit.     Review of Systems;  Patient denies headache, fevers, malaise, unintentional weight loss, skin rash, eye pain, sinus congestion and sinus pain, sore throat, dysphagia,  hemoptysis , cough, dyspnea, wheezing, chest pain, palpitations, orthopnea, edema, abdominal pain, nausea, melena,  diarrhea, constipation, flank pain, dysuria, hematuria, urinary  Frequency, nocturia, numbness, tingling, seizures,  Focal weakness, Loss of consciousness,  Tremor, insomnia, depression, anxiety, and suicidal ideation.      Objective:  BP 122/70 (BP Location: Left Arm, Patient Position: Sitting, Cuff Size: Normal)   Pulse 74   Temp (!) 97.3 F (36.3 C) (Oral)   Resp 15   Ht 5\' 2"  (1.575 m)   Wt 140 lb 12.8 oz (63.9 kg)   SpO2 97%   BMI 25.75 kg/m   BP Readings from Last 3 Encounters:  11/24/18 122/70  11/15/18 126/68  11/08/18 (!) 158/84    Wt Readings from Last 3 Encounters:  11/24/18 140 lb 12.8 oz (63.9 kg)  11/15/18 142 lb 6.4 oz (64.6 kg)  11/08/18 141 lb 9.6 oz (64.2 kg)    General appearance: alert, cooperative and appears stated age Psych: affect normal, makes good eye contact. No fidgeting,  Smiles easily.  Denies suicidal thoughts  Neuro:  awake and interactive with normal mood and affect. Higher cortical functions are normal. Speech is clear without word-finding difficulty or dysarthria.   Lab Results  Component Value Date   HGBA1C 5.8 09/13/2015   HGBA1C 6.0 03/05/2015    Lab Results  Component Value Date   CREATININE 0.90 08/12/2018   CREATININE 0.86 09/22/2017   CREATININE 0.99 11/09/2016    Lab Results  Component Value  Date   WBC 7.5 08/12/2018   HGB 13.6 08/12/2018   HCT 40.4 08/12/2018   PLT 284.0 08/12/2018   GLUCOSE 83 08/12/2018   CHOL 257 (H) 11/05/2016   TRIG 146.0 11/05/2016   HDL 55.90 11/05/2016   LDLDIRECT 78.0 06/10/2016   LDLCALC 172 (H) 11/05/2016   ALT 16 08/12/2018   AST 17 08/12/2018   NA 136 08/12/2018   K 4.4 08/12/2018   CL 100 08/12/2018   CREATININE 0.90 08/12/2018   BUN 20 08/12/2018   CO2 29 08/12/2018   TSH 0.78 03/30/2018   INR 0.96 04/14/2016   HGBA1C 5.8 09/13/2015    Dg Bone Density (dxa)  Result Date: 04/20/2017 EXAM: DUAL X-RAY ABSORPTIOMETRY (DXA) FOR BONE MINERAL DENSITY IMPRESSION: Dear Dr Marella Chimes, Your patient [name] completed a BMD test on 04/20/2017 using the [place] (analysis version: 14.10) manufactured by [employer]. The following summarizes the results of our evaluation. PATIENT BIOGRAPHICAL: Name: Mary, Vargas Patient ID: [id number] Birth Date: 1927-05-12 Height: 62.5 in. Gender: Female Exam Date: 04/20/2017 Weight: 140.8 lbs. Indications: Advanced Age, Caucasian, Family Hx of Osteoporosis, Low Body Weight, Osteoporotic, POSTmenopausal, Rheumatoid Arthritis Fractures: Treatments: 81 MG ASPIRIN, levothyroxin, prolia, Vitamin D ASSESSMENT: The BMD measured at Femur Neck Left is 0.677 g/cm2 with a T-score of -2.6. This patient is considered OSTEOPOROTIC according to [facility]) criteria. L1 was excluded due to degenerative changes. Patient is not a candidate for FRAX assessment due to osteoporosis report and Prolia use. Site Region Measured Measured WHO Young Adult BMD Date       Age      Classification T-score DualFemur Neck Left 04/20/2017 88.0 Osteoporosis -2.6 0.677 g/cm2 AP Spine L2-L4 04/20/2017 88.0 Osteopenia -1.9 0.976 g/cm2 World Health Organization [facility]) criteria for post-menopausal, Caucasian Women: Normal:       T-score at or above -1 SD Osteopenia:   T-score between -1 and -2.5 SD Osteoporosis: T-score at or below -2.5 SD RECOMMENDATIONS: [place] recommends that FDA-approved medical therapies be considered in postemenopausal women and men age 51 or older with a: 1. Hip or vertebral (clinical or morphometric) fracture. 2. T-score of < -2.5at the spine or hip. 3. Ten-year fracture probability by FRAX of 3% or greater for hip fracture or 20% or greater for major osteoporotic fracture. All treatment decisions require clinical judgment and consideration of individual patient factors, including patient preferences, co-morbidities, previous drug use, risk factors not captured in the FRAX model (e.g. falls,  vitamin D deficiency, increased bone turnover, interval significant decline in bone density) and possible under - or over-estimation of fracture risk by FRAX. All patients should ensure an adequate intake of dietary calcium (1200 mg/d) and vitamin D (800 IU daily) unless contraindicated. FOLLOW-UP: People with diagnosed cases of osteoporosis or at high risk for fracture should have regular bone mineral density tests. For patients eligible for Medicare, routine testing is allowed once every 2 years. The testing frequency can be increased to one year for patients who have rapidly progressing disease, those who are receiving or discontinuing medical therapy to restore bone mass, or have additional risk factors. I have reviewed this report, and agree with the above findings. Mark A. Thornton Papas, M.D. [facility] Radiology Electronically Signed   By: Lavonia Dana M.D.   On: 04/20/2017 10:05    Assessment & Plan:   Problem List Items Addressed This Visit    Counseling regarding end of life decision making    DNR  and MOST forms were completed with patient ,  Signed and given to patient       A total of 25 minutes of face to face time was spent with patient more than half of which was spent in counselling about the above mentioned conditions  and coordination of care   I am having Arshiya W. Sorber maintain her aspirin EC, cholecalciferol, predniSONE, diphenhydrAMINE, levothyroxine, metoprolol succinate, and losartan.  No orders of the defined types were placed in this encounter.   There are no discontinued medications.  Follow-up: No follow-ups on file.   Crecencio Mc, MD

## 2018-11-25 ENCOUNTER — Ambulatory Visit: Payer: Medicare Other

## 2018-11-25 DIAGNOSIS — Z23 Encounter for immunization: Secondary | ICD-10-CM

## 2018-11-25 DIAGNOSIS — Z111 Encounter for screening for respiratory tuberculosis: Secondary | ICD-10-CM

## 2018-11-25 LAB — READ PPD: TB SKIN TEST: NEGATIVE

## 2018-11-27 ENCOUNTER — Encounter: Payer: Self-pay | Admitting: Internal Medicine

## 2018-11-27 DIAGNOSIS — Z7189 Other specified counseling: Secondary | ICD-10-CM | POA: Insufficient documentation

## 2018-11-27 NOTE — Assessment & Plan Note (Signed)
DNR and MOST forms were completed with patient ,  Signed and given to patient

## 2018-11-28 ENCOUNTER — Encounter: Payer: Self-pay | Admitting: Internal Medicine

## 2018-12-01 ENCOUNTER — Ambulatory Visit: Payer: Medicare Other | Admitting: Internal Medicine

## 2018-12-05 ENCOUNTER — Other Ambulatory Visit: Payer: Self-pay | Admitting: Internal Medicine

## 2018-12-05 NOTE — Telephone Encounter (Signed)
Refilled: 06/06/2018 Last OV: 11/24/2018 Next OV: 10/12/2019 Last TSH: 03/30/2018

## 2018-12-05 NOTE — Telephone Encounter (Signed)
Thyroid med refilled.

## 2018-12-06 ENCOUNTER — Ambulatory Visit: Payer: Medicare Other | Admitting: Gastroenterology

## 2018-12-08 ENCOUNTER — Telehealth: Payer: Self-pay | Admitting: Internal Medicine

## 2018-12-08 NOTE — Telephone Encounter (Signed)
Insurance verification for Prolia filed on [employer] Portal. 

## 2018-12-15 DIAGNOSIS — Z8739 Personal history of other diseases of the musculoskeletal system and connective tissue: Secondary | ICD-10-CM | POA: Diagnosis not present

## 2018-12-15 DIAGNOSIS — M25552 Pain in left hip: Secondary | ICD-10-CM | POA: Diagnosis not present

## 2018-12-15 DIAGNOSIS — R768 Other specified abnormal immunological findings in serum: Secondary | ICD-10-CM | POA: Diagnosis not present

## 2018-12-15 DIAGNOSIS — M0600 Rheumatoid arthritis without rheumatoid factor, unspecified site: Secondary | ICD-10-CM | POA: Diagnosis not present

## 2018-12-15 DIAGNOSIS — M810 Age-related osteoporosis without current pathological fracture: Secondary | ICD-10-CM | POA: Diagnosis not present

## 2018-12-15 DIAGNOSIS — Z6825 Body mass index (BMI) 25.0-25.9, adult: Secondary | ICD-10-CM | POA: Diagnosis not present

## 2018-12-15 DIAGNOSIS — Z840 Family history of diseases of the skin and subcutaneous tissue: Secondary | ICD-10-CM | POA: Diagnosis not present

## 2018-12-15 DIAGNOSIS — E663 Overweight: Secondary | ICD-10-CM | POA: Diagnosis not present

## 2018-12-15 DIAGNOSIS — E559 Vitamin D deficiency, unspecified: Secondary | ICD-10-CM | POA: Diagnosis not present

## 2019-01-09 IMAGING — DX DG CHEST 2V
2 series · 2 of 2 positions shown · non-contrast
Comparison: 11/18/2005; prior exam of 04/11/2018 is not in PACs for
comparison

CLINICAL DATA: Follow-up pneumonia

EXAM:
CHEST - 2 VIEW

[chest pa]
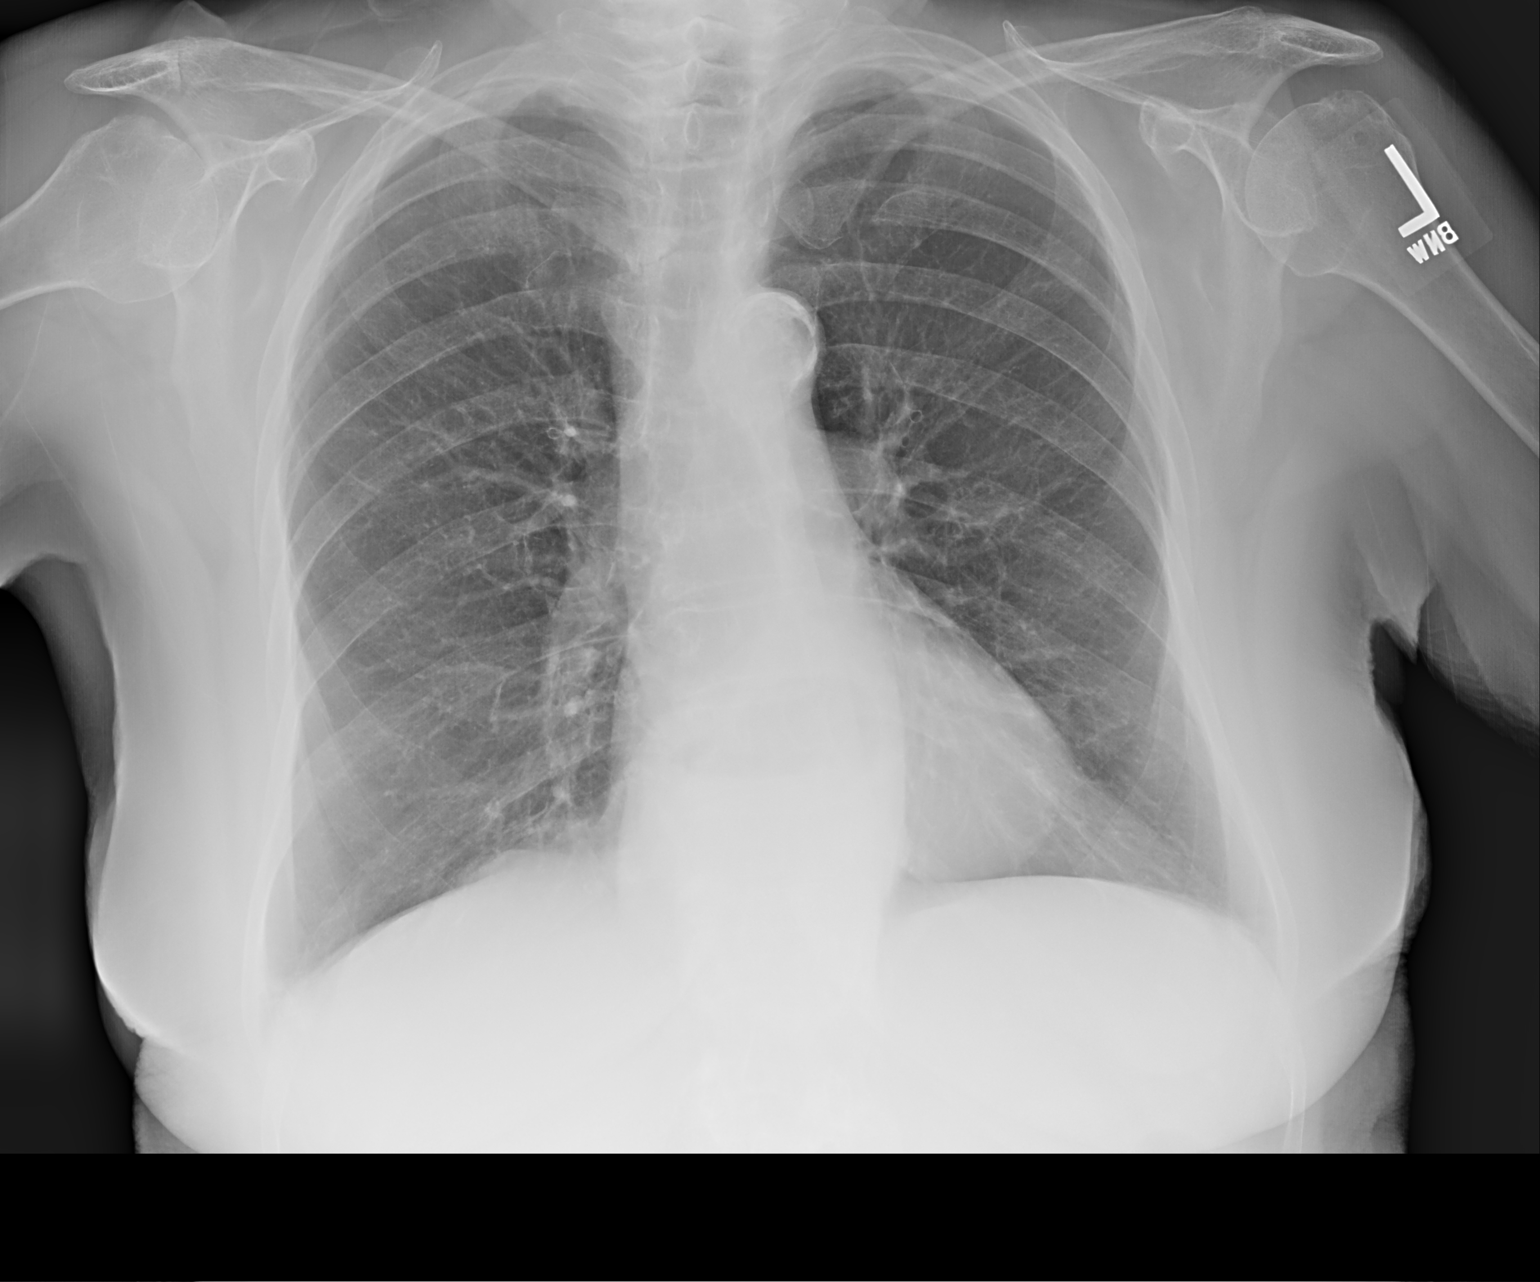

[chest lat]
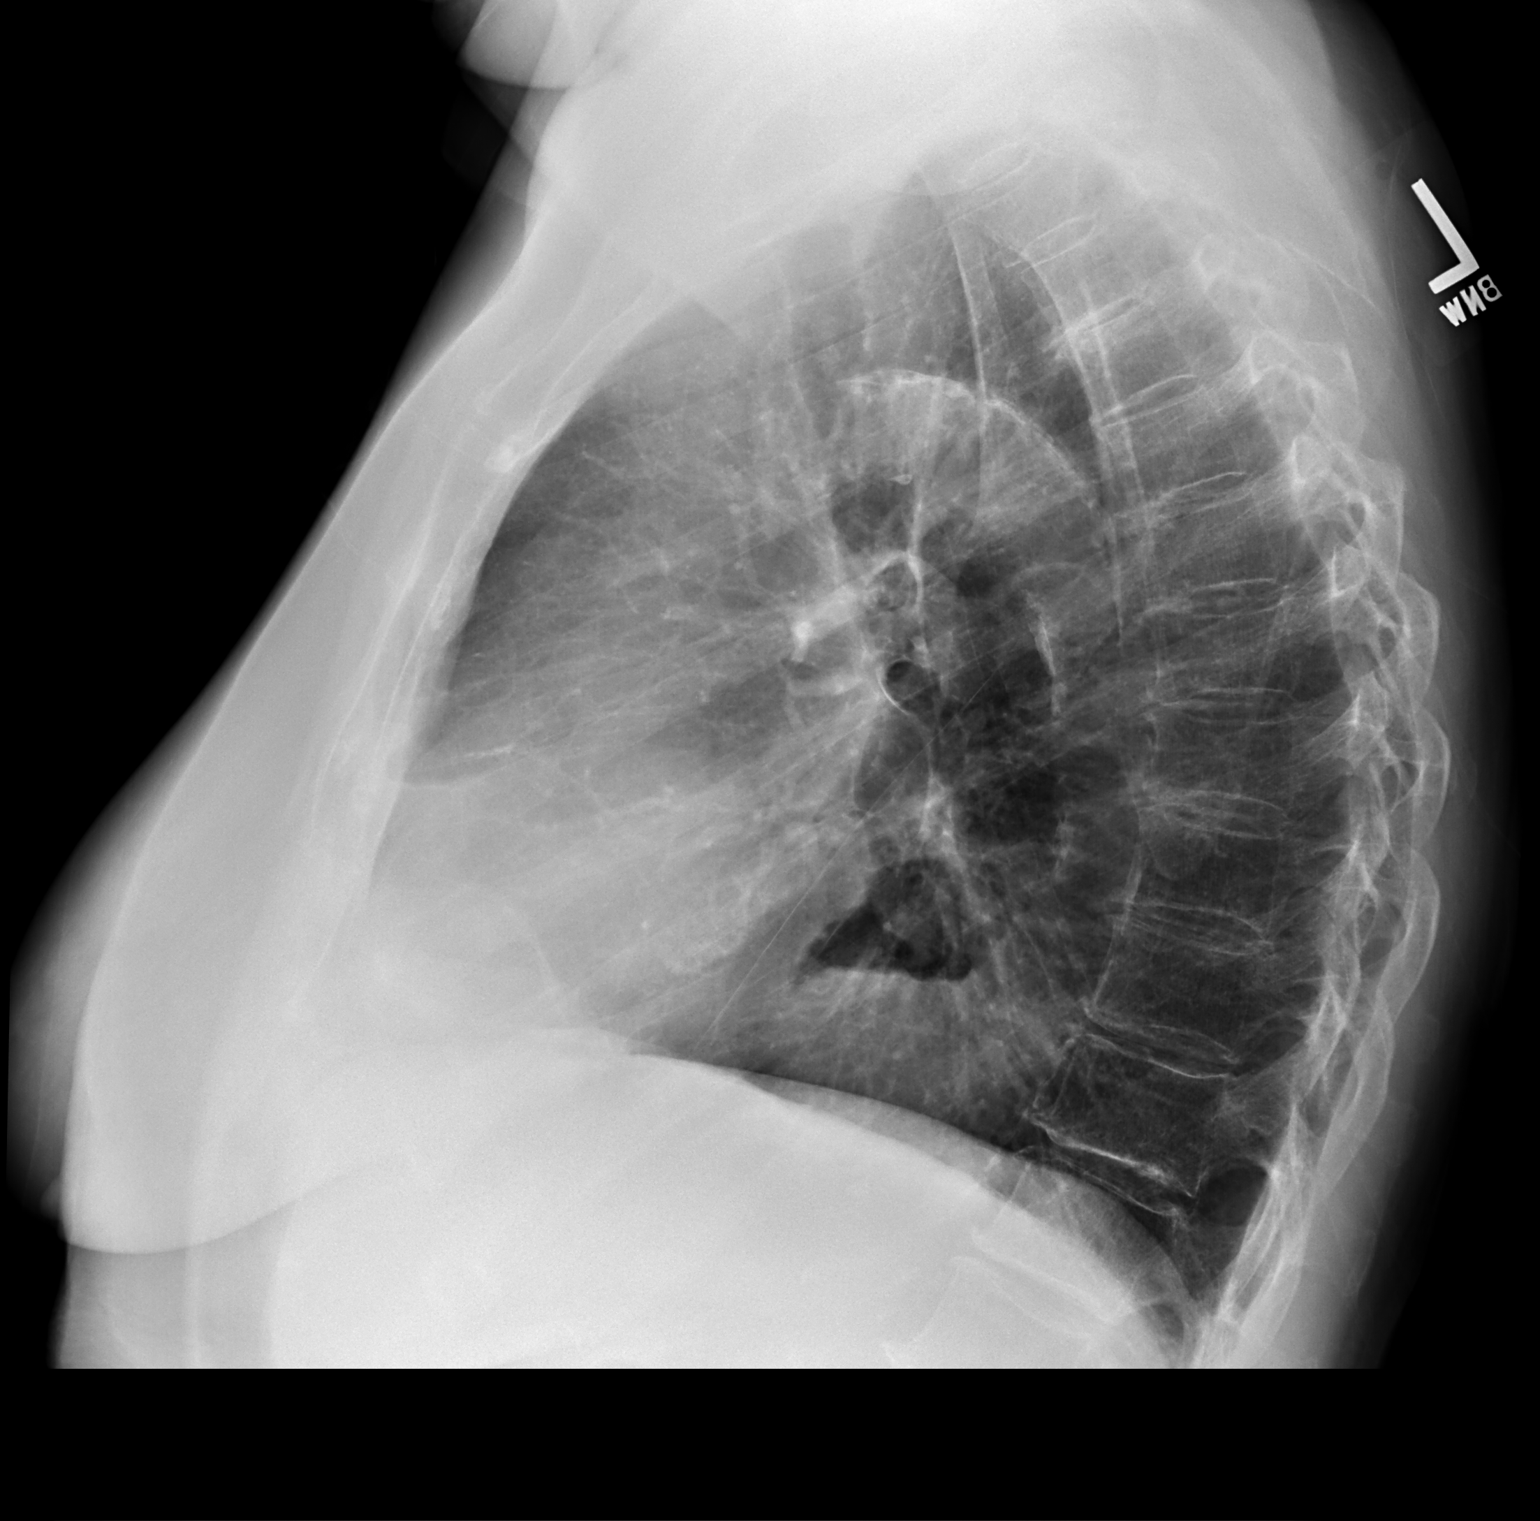

[2 of 2 positions shown; findings below may reference images not displayed]

FINDINGS: Normal heart size and pulmonary vascularity.

Atherosclerotic calcification aorta.

Moderate-sized hiatal hernia.

Peribronchial thickening new versus previous exam.

No acute infiltrate, pleural effusion or pneumothorax.

Specifically no lower lobe infiltrates are seen.

Diffuse osseous demineralization.
IMPRESSION: Bronchitic changes without infiltrate.

## 2019-01-18 ENCOUNTER — Other Ambulatory Visit: Payer: Self-pay | Admitting: Internal Medicine

## 2019-02-01 ENCOUNTER — Ambulatory Visit (INDEPENDENT_AMBULATORY_CARE_PROVIDER_SITE_OTHER): Payer: Medicare Other

## 2019-02-01 ENCOUNTER — Other Ambulatory Visit: Payer: Self-pay

## 2019-02-01 DIAGNOSIS — T380X5A Adverse effect of glucocorticoids and synthetic analogues, initial encounter: Secondary | ICD-10-CM

## 2019-02-01 DIAGNOSIS — M818 Other osteoporosis without current pathological fracture: Secondary | ICD-10-CM

## 2019-02-01 MED ORDER — DENOSUMAB 60 MG/ML ~~LOC~~ SOSY
60.0000 mg | PREFILLED_SYRINGE | Freq: Once | SUBCUTANEOUS | Status: AC
  Administered 2019-02-01: 60 mg via SUBCUTANEOUS

## 2019-02-01 NOTE — Progress Notes (Signed)
[name] presents today for injection per MD orders. Prolia injection administered SQ in left  Arm. Administration without incident. Patient tolerated well.  Momina Hunton,cma

## 2019-03-08 DIAGNOSIS — M79602 Pain in left arm: Secondary | ICD-10-CM | POA: Diagnosis not present

## 2019-03-08 DIAGNOSIS — S41112A Laceration without foreign body of left upper arm, initial encounter: Secondary | ICD-10-CM | POA: Diagnosis not present

## 2019-03-08 DIAGNOSIS — S51812A Laceration without foreign body of left forearm, initial encounter: Secondary | ICD-10-CM | POA: Insufficient documentation

## 2019-03-09 ENCOUNTER — Other Ambulatory Visit: Payer: Self-pay | Admitting: Internal Medicine

## 2019-03-09 MED ORDER — LEVOTHYROXINE SODIUM 100 MCG PO TABS: ORAL_TABLET | ORAL | 1 refills | Status: DC

## 2019-03-09 NOTE — Telephone Encounter (Signed)
Copied from [place]. Topic: Quick Communication - See Telephone Encounter >> Mar 09, 2019  8:18 AM Loma [place] wrote: CRM for notification. See Telephone encounter for: 03/09/19.levothyroxine (SYNTHROID, LEVOTHROID) 100 MCG tablet  [place], [place] [address] Phone: [phone] Fax: [phone] Not a 24 hour pharmacy; exact hours not known.  New Pharmacy Please refill

## 2019-03-09 NOTE — Telephone Encounter (Signed)
Medication refill has been sent to [facility].

## 2019-03-22 DIAGNOSIS — S51812D Laceration without foreign body of left forearm, subsequent encounter: Secondary | ICD-10-CM | POA: Diagnosis not present

## 2019-04-12 DIAGNOSIS — S51812A Laceration without foreign body of left forearm, initial encounter: Secondary | ICD-10-CM | POA: Diagnosis not present

## 2019-04-24 ENCOUNTER — Other Ambulatory Visit: Payer: Medicare Other

## 2019-05-09 DIAGNOSIS — L602 Onychogryphosis: Secondary | ICD-10-CM | POA: Diagnosis not present

## 2019-05-09 DIAGNOSIS — Q6689 Other  specified congenital deformities of feet: Secondary | ICD-10-CM | POA: Diagnosis not present

## 2019-05-22 DIAGNOSIS — M0600 Rheumatoid arthritis without rheumatoid factor, unspecified site: Secondary | ICD-10-CM | POA: Diagnosis not present

## 2019-05-22 DIAGNOSIS — M25552 Pain in left hip: Secondary | ICD-10-CM | POA: Diagnosis not present

## 2019-05-22 DIAGNOSIS — E559 Vitamin D deficiency, unspecified: Secondary | ICD-10-CM | POA: Diagnosis not present

## 2019-05-22 DIAGNOSIS — R768 Other specified abnormal immunological findings in serum: Secondary | ICD-10-CM | POA: Diagnosis not present

## 2019-05-22 DIAGNOSIS — Z8739 Personal history of other diseases of the musculoskeletal system and connective tissue: Secondary | ICD-10-CM | POA: Diagnosis not present

## 2019-05-22 DIAGNOSIS — Z6824 Body mass index (BMI) 24.0-24.9, adult: Secondary | ICD-10-CM | POA: Diagnosis not present

## 2019-05-22 DIAGNOSIS — Z840 Family history of diseases of the skin and subcutaneous tissue: Secondary | ICD-10-CM | POA: Diagnosis not present

## 2019-05-22 DIAGNOSIS — M810 Age-related osteoporosis without current pathological fracture: Secondary | ICD-10-CM | POA: Diagnosis not present

## 2019-05-31 ENCOUNTER — Ambulatory Visit
Admission: RE | Admit: 2019-05-31 | Discharge: 2019-05-31 | Disposition: A | Discharge status: Home / Self Care | Payer: Medicare Other | Source: Ambulatory Visit | Attending: Internal Medicine | Admitting: Internal Medicine

## 2019-05-31 DIAGNOSIS — M818 Other osteoporosis without current pathological fracture: Secondary | ICD-10-CM | POA: Diagnosis not present

## 2019-05-31 DIAGNOSIS — M810 Age-related osteoporosis without current pathological fracture: Secondary | ICD-10-CM | POA: Diagnosis not present

## 2019-05-31 DIAGNOSIS — M8588 Other specified disorders of bone density and structure, other site: Secondary | ICD-10-CM | POA: Diagnosis not present

## 2019-05-31 DIAGNOSIS — T380X5A Adverse effect of glucocorticoids and synthetic analogues, initial encounter: Secondary | ICD-10-CM | POA: Diagnosis not present

## 2019-06-28 DIAGNOSIS — Z23 Encounter for immunization: Secondary | ICD-10-CM | POA: Diagnosis not present

## 2019-07-18 ENCOUNTER — Telehealth: Payer: Self-pay | Admitting: Gastroenterology

## 2019-07-18 DIAGNOSIS — R195 Other fecal abnormalities: Secondary | ICD-10-CM

## 2019-07-18 NOTE — Telephone Encounter (Signed)
Patient states she will come pick up the stool test today and will stop caffeine drinks

## 2019-07-18 NOTE — Telephone Encounter (Signed)
Called and left a message for call back  

## 2019-07-18 NOTE — Telephone Encounter (Signed)
Patient states she has been having diarrhea off and on. Patient states that she had it all day Saturday and then has it today. Patient states she is nausea. Patient states it happens after breakfast usually. Patient states she has sharp pains in her stomach. Patient states she drinks coffee every morning

## 2019-07-18 NOTE — Telephone Encounter (Signed)
Pt left vm returning a call for [place] .

## 2019-07-18 NOTE — Addendum Note (Signed)
Addended by: Ulyess Blossom L on: 07/18/2019 01:18 PM   Modules accepted: Orders

## 2019-07-18 NOTE — Telephone Encounter (Signed)
Pt is calling she is having  symptoms  Of bowel movement issues and stomach pain, Diarrhea , she is scheduled to see Dr. Bonna Gains 08/14/19 she would like a call for advice what to do in the meantime

## 2019-07-21 DIAGNOSIS — R195 Other fecal abnormalities: Secondary | ICD-10-CM | POA: Diagnosis not present

## 2019-07-22 DIAGNOSIS — L03115 Cellulitis of right lower limb: Secondary | ICD-10-CM | POA: Diagnosis not present

## 2019-07-27 LAB — GI PROFILE, STOOL, PCR

## 2019-08-03 ENCOUNTER — Ambulatory Visit (INDEPENDENT_AMBULATORY_CARE_PROVIDER_SITE_OTHER): Payer: Medicare Other

## 2019-08-03 ENCOUNTER — Other Ambulatory Visit: Payer: Self-pay

## 2019-08-03 DIAGNOSIS — M19011 Primary osteoarthritis, right shoulder: Secondary | ICD-10-CM | POA: Diagnosis not present

## 2019-08-03 DIAGNOSIS — M19012 Primary osteoarthritis, left shoulder: Secondary | ICD-10-CM | POA: Diagnosis not present

## 2019-08-03 MED ORDER — DENOSUMAB 60 MG/ML ~~LOC~~ SOSY
60.0000 mg | PREFILLED_SYRINGE | Freq: Once | SUBCUTANEOUS | Status: AC
  Administered 2019-08-03: 60 mg via SUBCUTANEOUS

## 2019-08-03 NOTE — Progress Notes (Addendum)
Patient presented today for Prolia injection. Administered SQ in right arm.  Patient tolerated well with no signs of distress.  Reviewed.  Dr Scott   

## 2019-08-08 DIAGNOSIS — Z6825 Body mass index (BMI) 25.0-25.9, adult: Secondary | ICD-10-CM | POA: Diagnosis not present

## 2019-08-08 DIAGNOSIS — M069 Rheumatoid arthritis, unspecified: Secondary | ICD-10-CM | POA: Diagnosis not present

## 2019-08-08 DIAGNOSIS — I10 Essential (primary) hypertension: Secondary | ICD-10-CM | POA: Diagnosis not present

## 2019-08-14 ENCOUNTER — Encounter: Payer: Self-pay | Admitting: Gastroenterology

## 2019-08-14 ENCOUNTER — Ambulatory Visit (INDEPENDENT_AMBULATORY_CARE_PROVIDER_SITE_OTHER): Payer: Medicare Other | Admitting: Gastroenterology

## 2019-08-14 ENCOUNTER — Other Ambulatory Visit: Payer: Self-pay

## 2019-08-14 VITALS — BP 137/60 | HR 80 | Temp 98.1°F | Wt 140.1 lb

## 2019-08-14 DIAGNOSIS — R159 Full incontinence of feces: Secondary | ICD-10-CM

## 2019-08-14 NOTE — Progress Notes (Signed)
Mary Antigua, MD [address]  [place]  [place], [place] [address]  Main: [phone]  Fax: [phone]   Primary Care Physician: Mary Mc, MD  CC: Bowel changes  HPI: [name] is a 85 y.o. female initially seen for constipation, then had loose stools, here for follow-up.  She was taking MiraLAX but stopped taking it and alternates between having daily bowel movements, then regular bowel movements, then will have 1 or 2 days with loose stools associated with fecal incontinence as well.  No abdominal pain, weight loss, nausea or vomiting, blood in stool.   Her last colonoscopy was in 2005 by Dr. Bary Vargas for screening.  She was 76 at the time.  It is reported to be normal and repeat recommended in 10 years.  On my review of the images, there are only 2 images available in the colonoscopy report 1 of the terminal ileum and 1 of the cecum.  The cecal image does show some stool, and there are no comments on the procedure report itself of the prep during the colonoscopy.   Current Outpatient Medications  Medication Sig Dispense Refill  . aspirin EC 81 MG tablet Take 81 mg by mouth every evening.    . cholecalciferol (VITAMIN D) 1000 units tablet Take 1,000 Units by mouth 2 (two) times daily.    . diphenhydrAMINE (BENADRYL ALLERGY) 25 MG tablet Take 25 mg by mouth at bedtime. Pt states that she takes 1/2 tablet nightly.    . levothyroxine (SYNTHROID) 100 MCG tablet TAKE 1 TABLET BY MOUTH ONCE DAILY ON AN EMPTY STOMACH. WAIT 30 MINUTES BEFORE TAKING OTHER MEDS. 90 tablet 1  . losartan (COZAAR) 100 MG tablet TAKE 1 TABLET BY MOUTH ONCE DAILY 90 tablet 1  . metoprolol succinate (TOPROL-XL) 50 MG 24 hr tablet TAKE 1 TABLET BY MOUTH ONCE EVERY EVENING 90 tablet 3  . predniSONE (DELTASONE) 1 MG tablet Take 3 mg by mouth every other day.      No current facility-administered medications for this visit.     Allergies as of 08/14/2019 - Review Complete 11/24/2018   Allergen Reaction Noted  . Pollen extract    . Levofloxacin Anxiety 04/25/2018    ROS:  General: Negative for anorexia, weight loss, fever, chills, fatigue, weakness. ENT: Negative for hoarseness, difficulty swallowing , nasal congestion. CV: Negative for chest pain, angina, palpitations, dyspnea on exertion, peripheral edema.  Respiratory: Negative for dyspnea at rest, dyspnea on exertion, cough, sputum, wheezing.  GI: See history of present illness. GU:  Negative for dysuria, hematuria, urinary incontinence, urinary frequency, nocturnal urination.  Endo: Negative for unusual weight change.    Physical Examination:  Vitals:   08/14/19 1522  BP: 137/60  Pulse: 80  Temp: 98.1 F (36.7 C)  TempSrc: Oral  Weight: 140 lb 2 oz (63.6 kg)     General: Well-nourished, well-developed in no acute distress.  Eyes: No icterus. Conjunctivae pink. Mouth: Oropharyngeal mucosa moist and pink , no lesions erythema or exudate. Neck: Supple, Trachea midline Abdomen: Bowel sounds are normal, nontender, nondistended, no hepatosplenomegaly or masses, no abdominal bruits or hernia , no rebound or guarding.   Extremities: No lower extremity edema. No clubbing or deformities. Neuro: Alert and oriented x 3.  Grossly intact. Skin: Warm and dry, no jaundice.   Psych: Alert and cooperative, normal mood and affect.   Labs: CMP     Component Value Date/Time   NA 136 08/12/2018 0846   K 4.4 08/12/2018  0846   CL 100 08/12/2018 0846   CO2 29 08/12/2018 0846   GLUCOSE 83 08/12/2018 0846   BUN 20 08/12/2018 0846   CREATININE 0.90 08/12/2018 0846   CALCIUM 9.3 08/12/2018 0846   PROT 7.0 08/12/2018 0846   ALBUMIN 4.1 08/12/2018 0846   AST 17 08/12/2018 0846   ALT 16 08/12/2018 0846   ALKPHOS 60 08/12/2018 0846   BILITOT 0.4 08/12/2018 0846   GFRNONAA 58 (L) 04/14/2016 1818   GFRAA >60 04/14/2016 1818   Lab Results  Component Value Date   WBC 7.5 08/12/2018   HGB 13.6 08/12/2018   HCT 40.4  08/12/2018   MCV 95.9 08/12/2018   PLT 284.0 08/12/2018    Imaging Studies: No results found.  Assessment and Plan:   [name] is a 85 y.o. y/o female with intermittent fecal incontinence  Patient likely has pelvic floor dysfunction causing her fecal incontinence.  There are no alarm symptoms present  She is not interested in a colonoscopy  I have recommended pelvic floor physical therapy.  Patient see the physical therapist otherwise, and would like to discuss with her first if she is able to offer pelvic floor physical therapy.  If not, she will contact us and we will refer her to a local pelvic floor physical therapist  In the meantime, I have asked her to stop her MiraLAX and start taking Metamucil to help bulk her stool.  She is willing to try this.      Dr [name]

## 2019-08-14 NOTE — Patient Instructions (Addendum)
Stop Miralax and start Metamucil Daily.   Please ask Physical Therapist about Pelvic Floor physical therapy

## 2019-08-15 ENCOUNTER — Telehealth: Payer: Self-pay | Admitting: Gastroenterology

## 2019-08-15 DIAGNOSIS — R159 Full incontinence of feces: Secondary | ICD-10-CM

## 2019-08-15 DIAGNOSIS — M6289 Other specified disorders of muscle: Secondary | ICD-10-CM

## 2019-08-15 NOTE — Telephone Encounter (Signed)
referral for pelvic floor physical therapy with diagnosis - pelvic floor dysfunction, and fecal incontinence. She can use and take it to her physical therapist.  Per Dr. Bonna Gains.   Patient verbalized understanding. Patient states she does not want to drive and pick this up but wants [place] to put it in the mail

## 2019-08-15 NOTE — Telephone Encounter (Addendum)
Patient is calling she states that she left a message for the Wellness director Evlyn Kanner cox to ask her if they would do Pelvic Floor Physical therapy.Patient lives in a apartment at [facility] in [place]  Patient states that before when she had this done she had to have a prescription for insurance to cover this. Patient would like this prescription written out for her with the Diagnosis code on it so she can have this done.

## 2019-08-15 NOTE — Addendum Note (Signed)
Addended by: Ulyess Blossom L on: 08/15/2019 02:17 PM   Modules accepted: Orders

## 2019-08-15 NOTE — Telephone Encounter (Signed)
Pt is calling she was seen yesterday by Dr. Bonna Gains and she wanted to give pt a prescription for Physical therapy please call pt

## 2019-08-16 ENCOUNTER — Other Ambulatory Visit: Payer: Self-pay

## 2019-08-16 DIAGNOSIS — Z20822 Contact with and (suspected) exposure to covid-19: Secondary | ICD-10-CM

## 2019-08-17 LAB — NOVEL CORONAVIRUS, NAA: SARS-CoV-2, NAA: NOT DETECTED

## 2019-08-24 DIAGNOSIS — S81802A Unspecified open wound, left lower leg, initial encounter: Secondary | ICD-10-CM | POA: Diagnosis not present

## 2019-09-15 ENCOUNTER — Ambulatory Visit: Payer: Medicare Other | Attending: Gastroenterology | Admitting: Physical Therapy

## 2019-09-15 ENCOUNTER — Other Ambulatory Visit: Payer: Self-pay

## 2019-09-15 ENCOUNTER — Encounter: Payer: Self-pay | Admitting: Physical Therapy

## 2019-09-15 DIAGNOSIS — M6281 Muscle weakness (generalized): Secondary | ICD-10-CM

## 2019-09-15 DIAGNOSIS — R159 Full incontinence of feces: Secondary | ICD-10-CM

## 2019-09-15 DIAGNOSIS — N3941 Urge incontinence: Secondary | ICD-10-CM | POA: Diagnosis not present

## 2019-09-15 DIAGNOSIS — R278 Other lack of coordination: Secondary | ICD-10-CM | POA: Diagnosis not present

## 2019-09-15 NOTE — Therapy (Signed)
[facility] [address] [place], [place], [address] Phone: [phone]   Fax:  [phone]  Physical Therapy Evaluation  Patient Details  Name: [name] MRN: [id number] Date of Birth: 06-29-1927 Referring Provider (PT): Dr. [name]   Encounter Date: 09/15/2019  PT End of Session - 09/15/19 1013    Visit Number  1    Date for PT Re-Evaluation  11/10/19    PT Start Time  0800    PT Stop Time  0840    PT Time Calculation (min)  40 min    Activity Tolerance  Patient tolerated treatment well    Behavior During Therapy  [facility] for tasks assessed/performed       Past Medical History:  Diagnosis Date  . Arthritis   . Carpal tunnel syndrome   . Gait abnormality    has for 54yr-no use of cane  . Hx of TIA (transient ischemic attack) and stroke   . Hypertension   . Hypothyroidism   . Osteoporosis   . Wears glasses   . Wears hearing aid     Past Surgical History:  Procedure Laterality Date  . APPENDECTOMY    . CARPAL TUNNEL RELEASE Left 07/11/2013   Procedure: CARPAL TUNNEL RELEASE;  Surgeon: Cammie Sickle., MD;  Location: [facility];  Service: Orthopedics;  Laterality: Left;  . COLONOSCOPY    . SUBDURAL HEMATOMA EVACUATION VIA CRANIOTOMY  2007  . TONSILLECTOMY      There were no vitals filed for this visit.   Subjective Assessment - 09/15/19 0803    Subjective  I am having strange bowel movements. When I have the urge, I have to have a bowel movement and need to get there. I have spells with going many times with pains in my stomach. It will stop when I have diarrhea. I may have explosive BM to empty my whole system no matter where I am. WEars a light pad all the time. When she sees a bathroom, she will get a strong urge to use the bathroom to urinate.    Patient Stated Goals  increase pelvic floor strength    Currently in Pain?  No/denies         [facility] PT Assessment -  09/15/19 0001      Assessment   Medical Diagnosis  R15.9 Incontinenc of feces, unspecified fecal incontinence type; M62.89 Pelvic floor dysfunction    Referring Provider (PT)  Dr. [name]    Onset Date/Surgical Date  --   chronic     Precautions   Precautions  Other (comment)    Precaution Comments  osteoporosis      Restrictions   Weight Bearing Restrictions  No      Balance Screen   Has the patient fallen in the past 6 months  No    Has the patient had a decrease in activity level because of a fear of falling?   No    Is the patient reluctant to leave their home because of a fear of falling?   No      Home Film/video editor residence      Prior Function   Level of Independence  Independent    Vocation  Retired    Leisure  exercises weekly      Cognition   Overall Cognitive Status  Within Functional Limits for tasks assessed      Posture/Postural Control   Posture/Postural  Control  No significant limitations      ROM / Strength   AROM / PROM / Strength  AROM;PROM;Strength      Strength   Overall Strength  Within functional limits for tasks performed    Overall Strength Comments  bilateral hips 5/5; abdominal 3/5                Objective measurements completed on examination: See above findings.    Pelvic Floor Special Questions - 09/15/19 0001    Diastasis Recti  1 finger width below umbilicus    Urinary Leakage  Yes    Pad use  1 pad per day    Urinary urgency  Yes   when sees a commode   Fecal incontinence  Yes    Pelvic Floor Internal Exam  Patient approves PT to assess pelvic floor and treatment    Exam Type  Rectal    Palpation  no forward contraction of the puborectalis muscles    Strength  weak squeeze, no lift   lift after several tries   Tone  low               PT Education - 09/15/19 0842    Education Details  instructed patient on pelvic floor contraction in sidely    Person(s) Educated   Patient    Methods  Explanation;Demonstration;Verbal cues;Handout    Comprehension  Returned demonstration;Verbalized understanding       PT Short Term Goals - 09/15/19 1211      PT SHORT TERM GOAL #1   Title  independent with initial HEP    Time  4    Period  Weeks    Status  New    Target Date  10/13/19      PT SHORT TERM GOAL #2   Title  able to demonstrate abdominal massage to improve peristalic motion of the intestines    Time  4    Period  Weeks    Status  New    Target Date  10/13/19      PT SHORT TERM GOAL #3   Title  able to demonstrate correct toileting technique to fully evacuate her bowels    Time  4    Period  Weeks    Status  New    Target Date  10/13/19      PT SHORT TERM GOAL #4   Title  fecal leakage decreased >/= 25% due to improved pelvic floor strength and coordination    Time  4    Period  Weeks    Status  New    Target Date  10/13/19        PT Long Term Goals - 09/15/19 1212      PT LONG TERM GOAL #1   Title  independent with advanced HEP    Time  8    Period  Weeks    Status  New    Target Date  11/10/19      PT LONG TERM GOAL #2   Title  fecal leakage decreased >/= 75% due to pelvic floor strength >/= 4/5 holding for 10 seconds    Time  8    Period  Weeks    Status  New    Target Date  11/10/19      PT LONG TERM GOAL #3   Title  ability to pull the puborectalis forward so she is able to reduce fecal leakage    Time  8  Period  Weeks    Status  New    Target Date  11/10/19      PT LONG TERM GOAL #4   Title  demostrate contraction of the lower abdominals to improve pelvic floor strength    Time  8    Period  Weeks    Status  New    Target Date  11/10/19             Plan - 09/15/19 0843    Clinical Impression Statement  Patient is a 85 year old female with fecal and urinary incontinence. Patient did not have her hearing aides on and made it difficult to for her to hear the therapist. Patient reports she will have to  urinate when she sees a commode and leak when she does not want to. Patient will have explosive fecal incontinence when she does not want to. Patient sometimes has to go many time to fully empty her bowels. Anal sphincter strength is 2/5 with lift after several contractions. Patient puborectalis does not come forward with pelvic floor contraction. At first she would use her gluteal but later was able to contract her external anal sphincter. Patient has a 1 finger width diastasis recti below the umbilicus. Abdominal strength is 2/5. Patient would benefit from skilled therapy to improve pelvic floor coordination and strength to reduce incontinence.    Personal Factors and Comorbidities  Age;Comorbidity 1;Comorbidity 2;Comorbidity 3+;Time since onset of injury/illness/exacerbation    Comorbidities  History of TIA; osteoporosis; Hypothyrodism ; Appendectomy    Examination-Activity Limitations  Toileting;Continence    Examination-Participation Restrictions  Community Activity;Interpersonal Relationship    Stability/Clinical Decision Making  Evolving/Moderate complexity    Clinical Decision Making  Low    Rehab Potential  Excellent    PT Frequency  1x / week    PT Duration  8 weeks    PT Treatment/Interventions  Biofeedback;Therapeutic activities;Therapeutic exercise;Patient/family education;Neuromuscular re-education;Manual techniques    PT Next Visit Plan  work on anal sphincter strength and educating the puborectalis muscle to come forward; bridges, abdominal massage, toileting techinque,    Consulted and Agree with Plan of Care  Patient       Patient will benefit from skilled therapeutic intervention in order to improve the following deficits and impairments:  Decreased coordination, Increased fascial restricitons, Decreased strength  Visit Diagnosis: Other lack of coordination - Plan: PT plan of care cert/re-cert  Muscle weakness (generalized) - Plan: PT plan of care cert/re-cert  Incontinence  of feces, unspecified fecal incontinence type - Plan: PT plan of care cert/re-cert  Urge incontinence - Plan: PT plan of care cert/re-cert     Problem List Patient Active Problem List   Diagnosis Date Noted  . Counseling regarding end of life decision making 11/27/2018  . Osteoporosis, post-menopausal 08/04/2018  . Community acquired pneumonia 04/26/2018  . Acquired hypothyroidism 11/30/2017  . Seronegative rheumatoid arthritis ([place]) 11/30/2017  . Constipation 04/14/2017  . Left anterior knee pain 11/03/2016  . TIA (transient ischemic attack) 04/15/2016  . Family history of breast cancer in first degree relative 03/10/2016  . Osteoarthritis, shoulder 11/30/2015  . Essential hypertension 11/30/2015  . Vitamin D deficiency 11/30/2015  . 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 Age-related cognitive decline 09/09/2014  . History of polymyalgia rheumatica 09/09/2014  . Long-term use of high-risk medication 09/09/2014    Earlie Counts, PT 09/15/19 12:18 PM    [facility] [address] [place], [place], [address] Phone: [phone]   Fax:  [phone]  Name: [name] MRN: [id number] Date of Birth: [birth year]

## 2019-09-15 NOTE — Patient Instructions (Addendum)
Slow Contraction: Gravity Eliminated (Side-Lying)    Lie on left side, hips and knees slightly bent. Slowly squeeze pelvic floor for _5__ seconds. Rest for __5_ seconds. Repeat _5__ times. Do _4__ times a day.   Copyright  VHI. All rights reserved.  [place] [address], [place], [place] [address] Phone # [phone] Fax [phone]

## 2019-09-19 DIAGNOSIS — Z20828 Contact with and (suspected) exposure to other viral communicable diseases: Secondary | ICD-10-CM | POA: Diagnosis not present

## 2019-09-28 ENCOUNTER — Ambulatory Visit: Payer: Medicare Other | Attending: Gastroenterology | Admitting: Physical Therapy

## 2019-09-28 ENCOUNTER — Other Ambulatory Visit: Payer: Self-pay

## 2019-09-28 ENCOUNTER — Encounter: Payer: Self-pay | Admitting: Physical Therapy

## 2019-09-28 DIAGNOSIS — M6281 Muscle weakness (generalized): Secondary | ICD-10-CM | POA: Insufficient documentation

## 2019-09-28 DIAGNOSIS — R159 Full incontinence of feces: Secondary | ICD-10-CM | POA: Diagnosis present

## 2019-09-28 DIAGNOSIS — N3941 Urge incontinence: Secondary | ICD-10-CM

## 2019-09-28 DIAGNOSIS — R278 Other lack of coordination: Secondary | ICD-10-CM

## 2019-09-28 NOTE — Therapy (Signed)
[facility] [address] [place], [place], [address] Phone: [phone]   Fax:  [phone]  Physical Therapy Treatment  Patient Details  Name: [name] MRN: [id number] Date of Birth: 1927/08/30 Referring Provider (PT): Dr. Vonda Vargas   Encounter Date: 09/28/2019  PT End of Session - 09/28/19 1225    Visit Number  2    Date for PT Re-Evaluation  11/10/19    Authorization Type  Medicare [employer]    PT Start Time  7096    PT Stop Time  1225    PT Time Calculation (min)  40 min    Activity Tolerance  Patient tolerated treatment well    Behavior During Therapy  [facility] for tasks assessed/performed       Past Medical History:  Diagnosis Date  . Arthritis   . Carpal tunnel syndrome   . Gait abnormality    has for 79yrno use of cane  . Hx of TIA (transient ischemic attack) and stroke   . Hypertension   . Hypothyroidism   . Osteoporosis   . Wears glasses   . Wears hearing aid     Past Surgical History:  Procedure Laterality Date  . APPENDECTOMY    . CARPAL TUNNEL RELEASE Left 07/11/2013   Procedure: CARPAL TUNNEL RELEASE;  Surgeon: Mary Sickle, MD;  [facility]  Service: Orthopedics;  Laterality: Left;  . COLONOSCOPY    . SUBDURAL HEMATOMA EVACUATION VIA CRANIOTOMY  2007  . TONSILLECTOMY      There were no vitals filed for this visit.  Subjective Assessment - 09/28/19 1149    Subjective  I have to push to have a bowel movement. It has been 3 weeks since I had the explosive BM. After I have a bowel movement, I will have to go again after several minutes due to stool leaking out that is a solid ball.    Patient Stated Goals  increase pelvic floor strength    Currently in Pain?  No/denies                       [facility] Adult PT Treatment/Exercise - 09/28/19 0001      Lumbar Exercises: Seated   Other Seated Lumbar Exercises  seated pillow squeeze holding  for 5 sec with pelvic floor contraciton with verbal cues to not over squeeze    Other Seated Lumbar Exercises  seated hip abduction with yellow band 10 times with pelvic floor contraction      Manual Therapy   Manual Therapy  Soft tissue mobilization    Manual therapy comments  instructed patient on how to perform on herselt    Soft tissue mobilization  circular massage to abdomen to promote peristalic motion of the intestines to improve stool               PT Short Term Goals - 09/28/19 1229      PT SHORT TERM GOAL #1   Title  independent with initial HEP    Time  4    Period  Weeks    Status  On-going      PT SHORT TERM GOAL #2   Title  able to demonstrate abdominal massage to improve peristalic motion of the intestines    Time  4    Period  Weeks    Status  On-going        PT Long Term Goals - 09/15/19 1212  PT LONG TERM GOAL #1   Title  independent with advanced HEP    Time  8    Period  Weeks    Status  New    Target Date  11/10/19      PT LONG TERM GOAL #2   Title  fecal leakage decreased >/= 75% due to pelvic floor strength >/= 4/5 holding for 10 seconds    Time  8    Period  Weeks    Status  New    Target Date  11/10/19      PT LONG TERM GOAL #3   Title  ability to pull the puborectalis forward so she is able to reduce fecal leakage    Time  8    Period  Weeks    Status  New    Target Date  11/10/19      PT LONG TERM GOAL #4   Title  demostrate contraction of the lower abdominals to improve pelvic floor strength    Time  8    Period  Weeks    Status  New    Target Date  11/10/19            Plan - 09/28/19 1226    Clinical Impression Statement  Patient has not met goals yet due to just starting therapy. Patient has a bowel movement then after she leaves a firm stool will leak out. Patient has not had explosive fecal incontinence for 3 weeks. Patient needs verbal cues to not overcontract her body with sitting pelvic floor exercises.  Patient abdomen was softer after manual work and had good bowel sounds. Patient will benefit from skilled therapy to improve pelvic floor coordination and strength to reduce incontinenc.    Personal Factors and Comorbidities  Age;Comorbidity 1;Comorbidity 2;Comorbidity 3+;Time since onset of injury/illness/exacerbation    Comorbidities  History of TIA; osteoporosis; Hypothyrodism ; Appendectomy    Examination-Activity Limitations  Toileting;Continence    Examination-Participation Restrictions  Community Activity;Interpersonal Relationship    Stability/Clinical Decision Making  Evolving/Moderate complexity    Rehab Potential  Excellent    PT Frequency  1x / week    PT Duration  8 weeks    PT Treatment/Interventions  Biofeedback;Therapeutic activities;Therapeutic exercise;Patient/family education;Neuromuscular re-education;Manual techniques    PT Next Visit Plan  toileting, diaphragmatic breathing, resisted hip flexion    Recommended Other Services  MD signed intial eval    Consulted and Agree with Plan of Care  Patient       Patient will benefit from skilled therapeutic intervention in order to improve the following deficits and impairments:  Decreased coordination, Increased fascial restricitons, Decreased strength  Visit Diagnosis: Other lack of coordination  Muscle weakness (generalized)  Incontinence of feces, unspecified fecal incontinence type  Urge incontinence     Problem List Patient Active Problem List   Diagnosis Date Noted  . Counseling regarding end of life decision making 11/27/2018  . Osteoporosis, post-menopausal 08/04/2018  . Community acquired pneumonia 04/26/2018  . Acquired hypothyroidism 11/30/2017  . Seronegative rheumatoid arthritis ([place]) 11/30/2017  . Constipation 04/14/2017  . Left anterior knee pain 11/03/2016  . TIA (transient ischemic attack) 04/15/2016  . Family history of breast cancer in first degree relative 03/10/2016  . Osteoarthritis,  shoulder 11/30/2015  . Essential hypertension 11/30/2015  . Vitamin D deficiency 11/30/2015  . 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 Age-related cognitive decline 09/09/2014  . History of polymyalgia rheumatica 09/09/2014  . Long-term use of high-risk medication 09/09/2014    [name]  Pearline Cables, PT 09/28/19 12:30 PM   [facility] [address] [place], [place], [address] Phone: [phone]   Fax:  [phone]  Name: [name] MRN: [id number] Date of Birth: [birth year]

## 2019-09-28 NOTE — Patient Instructions (Addendum)
About Abdominal Massage  Abdominal massage, also called external colon massage, is a self-treatment circular massage technique that can reduce and eliminate gas and ease constipation. The colon naturally contracts in waves in a clockwise direction starting from inside the right hip, moving up toward the ribs, across the belly, and down inside the left hip.  When you perform circular abdominal massage, you help stimulate your colon's normal wave pattern of movement called peristalsis.  It is most beneficial when done after eating.  Positioning You can practice abdominal massage with oil while lying down, or in the shower with soap.  Some people find that it is just as effective to do the massage through clothing while sitting or standing.  How to Massage Start by placing your finger tips or knuckles on your right side, just inside your hip bone.  . Make small circular movements while you move upward toward your rib cage.   . Once you reach the bottom right side of your rib cage, take your circular movements across to the left side of the bottom of your rib cage.  . Next, move downward until you reach the inside of your left hip bone.  This is the path your feces travel in your colon. . Continue to perform your abdominal massage in this pattern for 3 minutes each day.     You can apply as much pressure as is comfortable in your massage.  Start gently and build pressure as you continue to practice.  Notice any areas of pain as you massage; areas of slight pain may be relieved as you massage, but if you have areas of significant or intense pain, consult with your healthcare provider.  Other Considerations . General physical activity including bending and stretching can have a beneficial massage-like effect on the colon.  Deep breathing can also stimulate the colon because breathing deeply activates the same nervous system that supplies the colon.   . Abdominal massage should always be used in  combination with a bowel-conscious diet that is high in the proper type of fiber for you, fluids (primarily water), and a regular exercise program. Adduction: Hip - Knees Together With Pelvic Floor (Sitting)    Sit with towel roll between knees. Squeeze pelvic floor while pushing knees together. Hold for _5__ seconds. Rest for _5__ seconds. Repeat _10__ times. Do _1-2__ times a day.  Copyright  VHI. All rights reserved.  External Rotation: Hip - Knees Apart With Pelvic Floor (Sitting)    Sit, band tied just above knees. Squeeze pelvic floor while pulling knees apart. Hold for ___ seconds. Rest for _1__ seconds. Repeat __10_ times. Do __1-2_ times a day.  Copyright  VHI. All rights reserved.  [facility] [address], [place], [place] [address] Phone # [phone] Fax [phone]

## 2019-10-05 ENCOUNTER — Ambulatory Visit: Payer: Medicare Other | Admitting: Physical Therapy

## 2019-10-05 ENCOUNTER — Other Ambulatory Visit: Payer: Self-pay

## 2019-10-05 ENCOUNTER — Encounter: Payer: Self-pay | Admitting: Physical Therapy

## 2019-10-05 DIAGNOSIS — R278 Other lack of coordination: Secondary | ICD-10-CM | POA: Diagnosis not present

## 2019-10-05 DIAGNOSIS — N3941 Urge incontinence: Secondary | ICD-10-CM

## 2019-10-05 DIAGNOSIS — R159 Full incontinence of feces: Secondary | ICD-10-CM

## 2019-10-05 DIAGNOSIS — M6281 Muscle weakness (generalized): Secondary | ICD-10-CM

## 2019-10-05 NOTE — Therapy (Signed)
[facility] [address] [place], [place], [address] Phone: [phone]   Fax:  [phone]  Physical Therapy Treatment  Patient Details  Name: [name] MRN: [id number] Date of Birth: 03/06/1927 Referring Provider (PT): Dr. [name]   Encounter Date: 10/05/2019  PT End of Session - 10/05/19 1434    Visit Number  3    Date for PT Re-Evaluation  11/10/19    Authorization Type  Medicare [employer]    PT Start Time  1400    PT Stop Time  1440    PT Time Calculation (min)  40 min    Activity Tolerance  Patient tolerated treatment well    Behavior During Therapy  [facility] for tasks assessed/performed       Past Medical History:  Diagnosis Date  . Arthritis   . Carpal tunnel syndrome   . Gait abnormality    has for 79yr-no use of cane  . Hx of TIA (transient ischemic attack) and stroke   . Hypertension   . Hypothyroidism   . Osteoporosis   . Wears glasses   . Wears hearing aid     Past Surgical History:  Procedure Laterality Date  . APPENDECTOMY    . CARPAL TUNNEL RELEASE Left 07/11/2013   Procedure: CARPAL TUNNEL RELEASE;  Surgeon: Cammie Sickle., MD;  Location: [facility];  Service: Orthopedics;  Laterality: Left;  . COLONOSCOPY    . SUBDURAL HEMATOMA EVACUATION VIA CRANIOTOMY  2007  . TONSILLECTOMY      There were no vitals filed for this visit.  Subjective Assessment - 10/05/19 1409    Subjective  I am concerned about the coronavirus numbers. I have trouble doing the exercises and contracting the pelvic floor. The stool leakage is better but worried it may happen.    Patient Stated Goals  increase pelvic floor strength    Currently in Pain?  No/denies                    Pelvic Floor Special Questions - 10/05/19 0001    Biofeedback  sitting resting tone is 1.2 uv, sitting pelvic floor contraction,  sitting clam with yellow band with pelvic floor contractioin at 10  uv 1 sec and 5 sec is 6 uv, verbal cues to help pateint feel the contraction with lower abdominal contraction and feel the anus lift up. contract anus with slight hip flexion and relaxing the legs at 5 uv, eyes closed to feel the anal contraction better.     Biofeedback sensor type  Surface   rectal                  PT Short Term Goals - 10/05/19 1411      PT SHORT TERM GOAL #1   Title  independent with initial HEP    Time  4    Period  Weeks    Status  On-going      PT SHORT TERM GOAL #2   Title  able to demonstrate abdominal massage to improve peristalic motion of the intestines    Time  4    Period  Weeks    Status  Achieved      PT SHORT TERM GOAL #3   Title  able to demonstrate correct toileting technique to fully evacuate her bowels    Time  4    Period  Weeks    Status  On-going  PT SHORT TERM GOAL #4   Title  fecal leakage decreased >/= 25% due to improved pelvic floor strength and coordination    Time  4    Period  Weeks    Status  Achieved        PT Long Term Goals - 09/15/19 1212      PT LONG TERM GOAL #1   Title  independent with advanced HEP    Time  8    Period  Weeks    Status  New    Target Date  11/10/19      PT LONG TERM GOAL #2   Title  fecal leakage decreased >/= 75% due to pelvic floor strength >/= 4/5 holding for 10 seconds    Time  8    Period  Weeks    Status  New    Target Date  11/10/19      PT LONG TERM GOAL #3   Title  ability to pull the puborectalis forward so she is able to reduce fecal leakage    Time  8    Period  Weeks    Status  New    Target Date  11/10/19      PT LONG TERM GOAL #4   Title  demostrate contraction of the lower abdominals to improve pelvic floor strength    Time  8    Period  Weeks    Status  New    Target Date  11/10/19            Plan - 10/05/19 1434    Clinical Impression Statement  Patient pelvic floor resting level is 1.5 uv. Patient is able to contract the pelvic floor in  sitting with verbal cues to relax her legs and flex hip slightly. Patient was able to feel the anal contraction by the end of the treatment. Patient able to contract with clam in sitting to 6 uv and hold 5 seconds. Patient is doing her abdominal massage. Patient has not had fecal leakage since 08/31/2019. Patient will benefit from skilled therapy to improve pelvic floor coordination and strength to reduce felcal leakage.    Personal Factors and Comorbidities  Age;Comorbidity 1;Comorbidity 2;Comorbidity 3+;Time since onset of injury/illness/exacerbation    Comorbidities  History of TIA; osteoporosis; Hypothyrodism ; Appendectomy    Examination-Activity Limitations  Toileting;Continence    Examination-Participation Restrictions  Community Activity;Interpersonal Relationship    Stability/Clinical Decision Making  Evolving/Moderate complexity    Rehab Potential  Excellent    PT Frequency  1x / week    PT Duration  8 weeks    PT Treatment/Interventions  Biofeedback;Therapeutic activities;Therapeutic exercise;Patient/family education;Neuromuscular re-education;Manual techniques    PT Next Visit Plan  toileting, pelvic floor EMG, resisted hip flexion with pelvic floor contraction in supine    Consulted and Agree with Plan of Care  Patient       Patient will benefit from skilled therapeutic intervention in order to improve the following deficits and impairments:  Decreased coordination, Increased fascial restricitons, Decreased strength  Visit Diagnosis: Other lack of coordination  Muscle weakness (generalized)  Incontinence of feces, unspecified fecal incontinence type  Urge incontinence     Problem List Patient Active Problem List   Diagnosis Date Noted  . Counseling regarding end of life decision making 11/27/2018  . Osteoporosis, post-menopausal 08/04/2018  . Community acquired pneumonia 04/26/2018  . Acquired hypothyroidism 11/30/2017  . Seronegative rheumatoid arthritis ([place])  11/30/2017  . Constipation 04/14/2017  . Left anterior knee pain  11/03/2016  . TIA (transient ischemic attack) 04/15/2016  . Family history of breast cancer in first degree relative 03/10/2016  . Osteoarthritis, shoulder 11/30/2015  . Essential hypertension 11/30/2015  . Vitamin D deficiency 11/30/2015  . 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 Age-related cognitive decline 09/09/2014  . History of polymyalgia rheumatica 09/09/2014  . Long-term use of high-risk medication 09/09/2014    Earlie Counts, PT 10/05/19 2:41 PM   [facility] [address] [place], [place], [address] Phone: [phone]   Fax:  [phone]  Name: [name] MRN: [id number] Date of Birth: [birth year]

## 2019-10-09 ENCOUNTER — Ambulatory Visit: Payer: Medicare Other | Admitting: Physical Therapy

## 2019-10-11 ENCOUNTER — Other Ambulatory Visit: Payer: Medicare Other

## 2019-10-12 ENCOUNTER — Ambulatory Visit: Payer: Medicare Other

## 2019-10-12 ENCOUNTER — Ambulatory Visit: Payer: Medicare Other | Admitting: Internal Medicine

## 2019-10-24 ENCOUNTER — Ambulatory Visit: Payer: Medicare Other | Admitting: Physical Therapy

## 2019-10-25 ENCOUNTER — Other Ambulatory Visit: Payer: Self-pay | Admitting: Internal Medicine

## 2019-10-30 DIAGNOSIS — Z23 Encounter for immunization: Secondary | ICD-10-CM | POA: Diagnosis not present

## 2019-11-08 ENCOUNTER — Other Ambulatory Visit: Payer: Self-pay | Admitting: Internal Medicine

## 2019-11-09 ENCOUNTER — Encounter: Payer: Self-pay | Admitting: Physical Therapy

## 2019-11-09 ENCOUNTER — Ambulatory Visit: Payer: Medicare Other | Attending: Gastroenterology | Admitting: Physical Therapy

## 2019-11-09 ENCOUNTER — Other Ambulatory Visit: Payer: Self-pay

## 2019-11-09 DIAGNOSIS — R159 Full incontinence of feces: Secondary | ICD-10-CM | POA: Diagnosis not present

## 2019-11-09 DIAGNOSIS — N3941 Urge incontinence: Secondary | ICD-10-CM

## 2019-11-09 DIAGNOSIS — R278 Other lack of coordination: Secondary | ICD-10-CM | POA: Diagnosis not present

## 2019-11-09 DIAGNOSIS — M6281 Muscle weakness (generalized): Secondary | ICD-10-CM | POA: Insufficient documentation

## 2019-11-09 NOTE — Therapy (Signed)
[facility] [address], [place], [place], [address] Phone: [phone]   Fax:  [phone]  Physical Therapy Treatment  Patient Details  Name: [name] MRN: [id number] Date of Birth: Mar 12, 1927 Referring Provider (PT): Dr. [name]   Encounter Date: 11/09/2019  PT End of Session - 11/09/19 1520    Visit Number  4    Date for PT Re-Evaluation  11/10/19    Authorization Type  Medicare A[employer]    PT Start Time  3748    PT Stop Time  1415    PT Time Calculation (min)  1410 min    Activity Tolerance  Patient tolerated treatment well    Behavior During Therapy  [facility] for tasks assessed/performed       Past Medical History:  Diagnosis Date  . Arthritis   . Carpal tunnel syndrome   . Gait abnormality    has for 7yrno use of cane  . Hx of TIA (transient ischemic attack) and stroke   . Hypertension   . Hypothyroidism   . Osteoporosis   . Wears glasses   . Wears hearing aid     Past Surgical History:  Procedure Laterality Date  . APPENDECTOMY    . CARPAL TUNNEL RELEASE Left 07/11/2013   Procedure: CARPAL TUNNEL RELEASE;  Surgeon: RCammie Sickle, MD;  [place] [facility]  Service: Orthopedics;  Laterality: Left;  . COLONOSCOPY    . SUBDURAL HEMATOMA EVACUATION VIA CRANIOTOMY  2007  . TONSILLECTOMY      There were no vitals filed for this visit.  Subjective Assessment - 11/09/19 1450    Subjective  I am satisfied with how I am doing. I have regular bowel movements with mimimal difficulty.    Patient Stated Goals  increase pelvic floor strength    Currently in Pain?  No/denies         [facility] - 11/09/19 0001      Assessment   Medical Diagnosis  R15.9 Incontinenc of feces, unspecified fecal incontinence type; M62.89 Pelvic floor dysfunction    Referring Provider (PT)  Dr. VVonda Antigua   Onset Date/Surgical Date  --   chronic     Precautions    Precautions  Other (comment)    Precaution Comments  osteoporosis      Restrictions   Weight Bearing Restrictions  No      [place]      Prior Function   Level of I[place] Retired    Leisure  exercises weekly      Cognition   Overall Cognitive Status  Within Functional Limits for tasks assessed      Strength   Overall Strength  Within functional limits for tasks performed    Overall Strength Comments  bilateral hips 5/5; abdominal 3/5                Pelvic Floor Special Questions - 11/09/19 0001    Urinary Leakage  Yes   very little   Pad use  1 pad per day    Exam Type  Deferred        [facility] Adult PT Treatment/Exercise - 11/09/19 0001      Manual Therapy   Manual Therapy  Soft tissue mobilization    Soft tissue mobilization  circular massage to abdomen to promote peristalic motion of the intestines to improve stool; soft  tissue work to the diaphragm and lower abdomen where there was some restrictions               PT Short Term Goals - 11/09/19 1522      PT SHORT TERM GOAL #1   Title  independent with initial HEP    Time  4    Period  Weeks    Status  Achieved      PT SHORT TERM GOAL #2   Title  able to demonstrate abdominal massage to improve peristalic motion of the intestines    Time  4    Period  Weeks    Status  Achieved      PT SHORT TERM GOAL #3   Title  able to demonstrate correct toileting technique to fully evacuate her bowels    Time  4    Period  Weeks    Status  Achieved      PT SHORT TERM GOAL #4   Title  fecal leakage decreased >/= 25% due to improved pelvic floor strength and coordination    Time  4    Period  Weeks    Status  Achieved        PT Long Term Goals - 11/09/19 1454      PT LONG TERM GOAL #1   Title  independent with advanced HEP    Time  8    Period  Weeks    Status  Achieved      PT LONG TERM GOAL #2   Title  fecal  leakage decreased >/= 75% due to pelvic floor strength >/= 4/5 holding for 10 seconds    Baseline  unable to test strength due to patient deferring it    Time  8    Period  Weeks    Status  Achieved      PT LONG TERM GOAL #3   Title  ability to pull the puborectalis forward so she is able to reduce fecal leakage    Time  8    Period  Weeks    Status  Deferred      PT LONG TERM GOAL #4   Title  demostrate contraction of the lower abdominals to improve pelvic floor strength    Time  8    Period  Weeks    Status  Achieved            Plan - 11/09/19 1459    Clinical Impression Statement  Patient reports she has had no issues since last visit. Patient reports she is ready for discharge. Patient does the abodminal massage and does not do her pelvic floor exercises. Patient does do exercise in a class 2 times per week for 20 minutes. Patient feels she is ready for discharge.    Personal Factors and Comorbidities  Age;Comorbidity 1;Comorbidity 2;Comorbidity 3+;Time since onset of injury/illness/exacerbation    Comorbidities  History of TIA; osteoporosis; Hypothyrodism ; Appendectomy    Examination-Activity Limitations  Toileting;Continence    Examination-Participation Restrictions  Community Activity;Interpersonal Relationship    Stability/Clinical Decision Making  Evolving/Moderate complexity    Rehab Potential  Excellent    PT Treatment/Interventions  Biofeedback;Therapeutic activities;Therapeutic exercise;Patient/family education;Neuromuscular re-education;Manual techniques    PT Next Visit Plan  Discharge to HEP today    PT Home Exercise Plan  Current HEP    Consulted and Agree with Plan of Care  Patient       Patient will benefit from skilled therapeutic intervention in order to improve  the following deficits and impairments:  Decreased coordination, Increased fascial restricitons, Decreased strength  Visit Diagnosis: Other lack of coordination  Muscle weakness  (generalized)  Incontinence of feces, unspecified fecal incontinence type  Urge incontinence     Problem List Patient Active Problem List   Diagnosis Date Noted  . Counseling regarding end of life decision making 11/27/2018  . Osteoporosis, post-menopausal 08/04/2018  . Community acquired pneumonia 04/26/2018  . Acquired hypothyroidism 11/30/2017  . Seronegative rheumatoid arthritis ([place]) 11/30/2017  . Constipation 04/14/2017  . Left anterior knee pain 11/03/2016  . TIA (transient ischemic attack) 04/15/2016  . Family history of breast cancer in first degree relative 03/10/2016  . Osteoarthritis, shoulder 11/30/2015  . Essential hypertension 11/30/2015  . Vitamin D deficiency 11/30/2015  . 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 Age-related cognitive decline 09/09/2014  . History of polymyalgia rheumatica 09/09/2014  . Long-term use of high-risk medication 09/09/2014    Earlie Counts, PT 11/09/19 3:25 PM   [facility] [address] [place], [place], [address] Phone: [phone]   Fax:  [phone]  Name: [name] MRN: [id number] Date of Birth: March 30, 1927  PHYSICAL THERAPY DISCHARGE SUMMARY  Visits from Start of Care: 4  Current functional level related to goals / functional outcomes: See above.    Remaining deficits: See above.    Education / Equipment: HEP Plan: Patient agrees to discharge.  Patient goals were met. Patient is being discharged due to meeting the stated rehab goals. Thank you for the referral. Earlie Counts, PT 11/09/19 3:25 PM   ?????

## 2019-11-10 ENCOUNTER — Telehealth: Payer: Self-pay | Admitting: Internal Medicine

## 2019-11-10 NOTE — Telephone Encounter (Signed)
Patient is transferring care to a new Provider [facility] and ask not to attain Prolia verification she would be sending for records in February when she establishes with new PCP.

## 2019-11-10 NOTE — Telephone Encounter (Signed)
Insurance verification for Prolia filed on [employer] Portal. 

## 2019-11-23 DIAGNOSIS — Z840 Family history of diseases of the skin and subcutaneous tissue: Secondary | ICD-10-CM | POA: Diagnosis not present

## 2019-11-23 DIAGNOSIS — Z6824 Body mass index (BMI) 24.0-24.9, adult: Secondary | ICD-10-CM | POA: Diagnosis not present

## 2019-11-23 DIAGNOSIS — M810 Age-related osteoporosis without current pathological fracture: Secondary | ICD-10-CM | POA: Diagnosis not present

## 2019-11-23 DIAGNOSIS — R768 Other specified abnormal immunological findings in serum: Secondary | ICD-10-CM | POA: Diagnosis not present

## 2019-11-23 DIAGNOSIS — Z8739 Personal history of other diseases of the musculoskeletal system and connective tissue: Secondary | ICD-10-CM | POA: Diagnosis not present

## 2019-11-23 DIAGNOSIS — M25552 Pain in left hip: Secondary | ICD-10-CM | POA: Diagnosis not present

## 2019-11-23 DIAGNOSIS — M0600 Rheumatoid arthritis without rheumatoid factor, unspecified site: Secondary | ICD-10-CM | POA: Diagnosis not present

## 2019-11-23 DIAGNOSIS — E559 Vitamin D deficiency, unspecified: Secondary | ICD-10-CM | POA: Diagnosis not present

## 2019-11-27 DIAGNOSIS — Z23 Encounter for immunization: Secondary | ICD-10-CM | POA: Diagnosis not present

## 2019-12-12 DIAGNOSIS — M069 Rheumatoid arthritis, unspecified: Secondary | ICD-10-CM | POA: Diagnosis not present

## 2019-12-12 DIAGNOSIS — I10 Essential (primary) hypertension: Secondary | ICD-10-CM | POA: Diagnosis not present

## 2019-12-12 DIAGNOSIS — Z0000 Encounter for general adult medical examination without abnormal findings: Secondary | ICD-10-CM | POA: Diagnosis not present

## 2019-12-25 DIAGNOSIS — I10 Essential (primary) hypertension: Secondary | ICD-10-CM | POA: Diagnosis not present

## 2019-12-25 DIAGNOSIS — Z0000 Encounter for general adult medical examination without abnormal findings: Secondary | ICD-10-CM | POA: Diagnosis not present

## 2019-12-25 DIAGNOSIS — Z136 Encounter for screening for cardiovascular disorders: Secondary | ICD-10-CM | POA: Diagnosis not present

## 2020-01-12 DIAGNOSIS — I10 Essential (primary) hypertension: Secondary | ICD-10-CM | POA: Diagnosis not present

## 2020-01-12 DIAGNOSIS — M810 Age-related osteoporosis without current pathological fracture: Secondary | ICD-10-CM | POA: Diagnosis not present

## 2020-01-12 DIAGNOSIS — M069 Rheumatoid arthritis, unspecified: Secondary | ICD-10-CM | POA: Diagnosis not present

## 2020-01-12 DIAGNOSIS — E039 Hypothyroidism, unspecified: Secondary | ICD-10-CM | POA: Diagnosis not present

## 2020-01-22 ENCOUNTER — Telehealth: Payer: Self-pay

## 2020-01-22 NOTE — Telephone Encounter (Signed)
Patient left voicemail. Wants to schedule with Dr. Chuck Hint.Called patient. No answer. Left voicemail asking her to call us back

## 2020-01-24 ENCOUNTER — Ambulatory Visit (INDEPENDENT_AMBULATORY_CARE_PROVIDER_SITE_OTHER): Payer: Medicare Other | Admitting: Gastroenterology

## 2020-01-24 ENCOUNTER — Encounter: Payer: Self-pay | Admitting: Gastroenterology

## 2020-01-24 DIAGNOSIS — K5900 Constipation, unspecified: Secondary | ICD-10-CM

## 2020-01-24 NOTE — Progress Notes (Signed)
Mary Antigua, MD [address]  [place]  [place], [place] [address]  Main: [phone]  Fax: [phone]   Primary Care Physician: No primary care provider on file.  Virtual Visit via Telephone Note  I connected with patient on 01/24/20 at 11:15 AM EDT by telephone and verified that I am speaking with the correct person using two identifiers.   I discussed the limitations, risks, security and privacy concerns of performing an evaluation and management service by telephone and the availability of in person appointments. I also discussed with the patient that there may be a patient responsible charge related to this service. The patient expressed understanding and agreed to proceed.  Location of Patient: Home Location of Provider: Home Persons involved: Patient and provider only during the visit (nursing staff and front desk staff was involved in communicating with the patient prior to the appointment, reviewing medications and checking them in)   History of Present Illness: Chief Complaint  Patient presents with  . Abdominal Pain     HPI: [name] is a 86 y.o. female previously seen for constipation and fecal incontinence, presents for follow-up.  Symptoms get better when she takes her MiraLAX, but due to bloating she sometimes stops it for prolonged periods which leads to return of constipation and abdominal bloating.  Was previously also referred to pelvic floor physical therapy and she had good results with them as far as helping her bowel movements.  She states she completed her physical therapy to her satisfaction and will return to them as needed for fecal incontinence  No blood in stool.  Restarted her MiraLAX 5 days ago and had a small bowel movement yesterday, and a little bit of a larger one today but continues to have some abdominal bloating  Patient is above the age for screening colonoscopy, see previous notes for previous history of up-to-date  colonoscopies  Current Outpatient Medications  Medication Sig Dispense Refill  . aspirin EC 81 MG tablet Take 81 mg by mouth every evening.    . cholecalciferol (VITAMIN D) 1000 units tablet Take 1,000 Units by mouth 2 (two) times daily.    [name] levothyroxine (SYNTHROID) 100 MCG tablet TAKE 1 TABLET BY MOUTH ONCE DAILY ON AN EMPTY STOMACH. WAIT 30 MINUTES BEFORE TAKING OTHER MEDS. 90 tablet 1  . losartan (COZAAR) 100 MG tablet TAKE 1 TABLET ONCE DAILY. 90 tablet 0  . metoprolol succinate (TOPROL-XL) 50 MG 24 hr tablet TAKE 1 TABLET ONCE DAILY IN THE EVENING. 30 tablet 0  . predniSONE (DELTASONE) 1 MG tablet Take 3 mg by mouth every other day.     . diphenhydrAMINE (BENADRYL ALLERGY) 25 MG tablet Take 25 mg by mouth at bedtime. Pt states that she takes 1/2 tablet nightly.     No current facility-administered medications for this visit.    Allergies as of 01/24/2020 - Review Complete 01/24/2020  Allergen Reaction Noted  . Pollen extract    . Levofloxacin Anxiety 04/25/2018    Review of Systems:    All systems reviewed and negative except where noted in HPI.   Observations/Objective:  Labs: CMP     Component Value Date/Time   NA 136 08/12/2018 0846   K 4.4 08/12/2018 0846   CL 100 08/12/2018 0846   CO2 29 08/12/2018 0846   GLUCOSE 83 08/12/2018 0846   BUN 20 08/12/2018 0846   CREATININE 0.90 08/12/2018 0846   CALCIUM 9.3 08/12/2018 0846   PROT 7.0 08/12/2018 0846  ALBUMIN 4.1 08/12/2018 0846   AST 17 08/12/2018 0846   ALT 16 08/12/2018 0846   ALKPHOS 60 08/12/2018 0846   BILITOT 0.4 08/12/2018 0846   GFRNONAA 58 (L) 04/14/2016 1818   GFRAA >60 04/14/2016 1818   Lab Results  Component Value Date   WBC 7.5 08/12/2018   HGB 13.6 08/12/2018   HCT 40.4 08/12/2018   MCV 95.9 08/12/2018   PLT 284.0 08/12/2018    Imaging Studies: No results found.  Assessment and Plan:   [name] is a 86 y.o. y/o female here for follow-up for constipation, with no alarm symptoms  present  Assessment and Plan: Patient advised not to stop MiraLAX for prolonged periods and to take it once daily but okay to hold it for 1 to 2 days if more than 2 loose bowel movements in a day occur  As long as she is taking her MiraLAX regularly, her symptoms resolve  However, since she is reporting abdominal bloating and small bowel movements over the last few days, I have asked her to take Dulcolax 10 mg over-the-counter today and assess her bowel movement response today and tomorrow.  If she does not have a satisfactory large bowel movement with resolution of abdominal bloating, to repeat the dosage for the subsequent days for the next 3 to 4 days.  And then resume her MiraLAX daily from then on  If symptoms do not any better patient was advised to call us and she verbalized understanding  Follow Up Instructions:    I discussed the assessment and treatment plan with the patient. The patient was provided an opportunity to ask questions and all were answered. The patient agreed with the plan and demonstrated an understanding of the instructions.   The patient was advised to call back or seek an in-person evaluation if the symptoms worsen or if the condition fails to improve as anticipated.  I provided 12 minutes of non-face-to-face time during this encounter. Additional time was spent in reviewing patient's chart, placing orders etc.   [name], MD  Speech recognition software was used to dictate this note.

## 2020-02-02 DIAGNOSIS — M810 Age-related osteoporosis without current pathological fracture: Secondary | ICD-10-CM | POA: Diagnosis not present

## 2020-03-12 ENCOUNTER — Other Ambulatory Visit: Payer: Self-pay | Admitting: Internal Medicine

## 2020-04-02 DIAGNOSIS — C44319 Basal cell carcinoma of skin of other parts of face: Secondary | ICD-10-CM | POA: Diagnosis not present

## 2020-04-02 DIAGNOSIS — L821 Other seborrheic keratosis: Secondary | ICD-10-CM | POA: Diagnosis not present

## 2020-04-02 DIAGNOSIS — L720 Epidermal cyst: Secondary | ICD-10-CM | POA: Diagnosis not present

## 2020-04-26 DIAGNOSIS — M069 Rheumatoid arthritis, unspecified: Secondary | ICD-10-CM | POA: Diagnosis not present

## 2020-04-26 DIAGNOSIS — M810 Age-related osteoporosis without current pathological fracture: Secondary | ICD-10-CM | POA: Diagnosis not present

## 2020-04-26 DIAGNOSIS — E039 Hypothyroidism, unspecified: Secondary | ICD-10-CM | POA: Diagnosis not present

## 2020-04-26 DIAGNOSIS — I10 Essential (primary) hypertension: Secondary | ICD-10-CM | POA: Diagnosis not present

## 2020-05-22 DIAGNOSIS — M0600 Rheumatoid arthritis without rheumatoid factor, unspecified site: Secondary | ICD-10-CM | POA: Diagnosis not present

## 2020-05-22 DIAGNOSIS — R768 Other specified abnormal immunological findings in serum: Secondary | ICD-10-CM | POA: Diagnosis not present

## 2020-05-22 DIAGNOSIS — Z840 Family history of diseases of the skin and subcutaneous tissue: Secondary | ICD-10-CM | POA: Diagnosis not present

## 2020-05-22 DIAGNOSIS — E663 Overweight: Secondary | ICD-10-CM | POA: Diagnosis not present

## 2020-05-22 DIAGNOSIS — M25552 Pain in left hip: Secondary | ICD-10-CM | POA: Diagnosis not present

## 2020-05-22 DIAGNOSIS — E559 Vitamin D deficiency, unspecified: Secondary | ICD-10-CM | POA: Diagnosis not present

## 2020-05-22 DIAGNOSIS — Z6825 Body mass index (BMI) 25.0-25.9, adult: Secondary | ICD-10-CM | POA: Diagnosis not present

## 2020-05-22 DIAGNOSIS — Z8739 Personal history of other diseases of the musculoskeletal system and connective tissue: Secondary | ICD-10-CM | POA: Diagnosis not present

## 2020-05-22 DIAGNOSIS — M810 Age-related osteoporosis without current pathological fracture: Secondary | ICD-10-CM | POA: Diagnosis not present

## 2020-06-05 ENCOUNTER — Other Ambulatory Visit: Payer: Self-pay | Admitting: Internal Medicine

## 2020-07-15 DIAGNOSIS — M810 Age-related osteoporosis without current pathological fracture: Secondary | ICD-10-CM | POA: Diagnosis not present

## 2020-07-15 DIAGNOSIS — M069 Rheumatoid arthritis, unspecified: Secondary | ICD-10-CM | POA: Diagnosis not present

## 2020-07-15 DIAGNOSIS — I10 Essential (primary) hypertension: Secondary | ICD-10-CM | POA: Diagnosis not present

## 2020-07-15 DIAGNOSIS — E039 Hypothyroidism, unspecified: Secondary | ICD-10-CM | POA: Diagnosis not present

## 2020-07-22 DIAGNOSIS — Z23 Encounter for immunization: Secondary | ICD-10-CM | POA: Diagnosis not present

## 2020-08-21 DIAGNOSIS — Z23 Encounter for immunization: Secondary | ICD-10-CM | POA: Diagnosis not present

## 2020-08-27 DIAGNOSIS — M069 Rheumatoid arthritis, unspecified: Secondary | ICD-10-CM | POA: Diagnosis not present

## 2020-08-27 DIAGNOSIS — M810 Age-related osteoporosis without current pathological fracture: Secondary | ICD-10-CM | POA: Diagnosis not present

## 2020-08-27 DIAGNOSIS — E039 Hypothyroidism, unspecified: Secondary | ICD-10-CM | POA: Diagnosis not present

## 2020-08-27 DIAGNOSIS — I10 Essential (primary) hypertension: Secondary | ICD-10-CM | POA: Diagnosis not present

## 2020-08-27 DIAGNOSIS — J302 Other seasonal allergic rhinitis: Secondary | ICD-10-CM | POA: Diagnosis not present

## 2020-08-27 DIAGNOSIS — E559 Vitamin D deficiency, unspecified: Secondary | ICD-10-CM | POA: Diagnosis not present

## 2020-08-29 ENCOUNTER — Encounter: Payer: Self-pay | Admitting: Gastroenterology

## 2020-08-29 ENCOUNTER — Other Ambulatory Visit: Payer: Self-pay

## 2020-08-29 ENCOUNTER — Ambulatory Visit (INDEPENDENT_AMBULATORY_CARE_PROVIDER_SITE_OTHER): Payer: Medicare Other | Admitting: Gastroenterology

## 2020-08-29 ENCOUNTER — Telehealth: Payer: Self-pay

## 2020-08-29 VITALS — BP 133/66 | HR 81 | Temp 97.6°F | Wt 143.6 lb

## 2020-08-29 DIAGNOSIS — Z5329 Procedure and treatment not carried out because of patient's decision for other reasons: Secondary | ICD-10-CM

## 2020-08-29 NOTE — Telephone Encounter (Signed)
Patient was contacted to see if she would want to reschedule her procedure with Dr. Bonna Gains. Patient stated that she was fine and did not want to schedule an appointment at this time since she was fine.

## 2020-08-30 NOTE — Progress Notes (Signed)
Pt left without being seen. Pt was contacted by Herb Grays and she did not want to reschedule appointment

## 2020-10-02 DIAGNOSIS — Z85828 Personal history of other malignant neoplasm of skin: Secondary | ICD-10-CM | POA: Diagnosis not present

## 2020-10-02 DIAGNOSIS — L820 Inflamed seborrheic keratosis: Secondary | ICD-10-CM | POA: Diagnosis not present

## 2020-10-09 DIAGNOSIS — E039 Hypothyroidism, unspecified: Secondary | ICD-10-CM | POA: Diagnosis not present

## 2020-10-09 DIAGNOSIS — I10 Essential (primary) hypertension: Secondary | ICD-10-CM | POA: Diagnosis not present

## 2020-10-09 DIAGNOSIS — M810 Age-related osteoporosis without current pathological fracture: Secondary | ICD-10-CM | POA: Diagnosis not present

## 2020-10-09 DIAGNOSIS — M069 Rheumatoid arthritis, unspecified: Secondary | ICD-10-CM | POA: Diagnosis not present

## 2020-10-31 DIAGNOSIS — H6122 Impacted cerumen, left ear: Secondary | ICD-10-CM | POA: Diagnosis not present

## 2020-10-31 DIAGNOSIS — K219 Gastro-esophageal reflux disease without esophagitis: Secondary | ICD-10-CM | POA: Diagnosis not present

## 2020-10-31 DIAGNOSIS — R059 Cough, unspecified: Secondary | ICD-10-CM | POA: Diagnosis not present

## 2020-11-01 ENCOUNTER — Ambulatory Visit
Admission: RE | Admit: 2020-11-01 | Discharge: 2020-11-01 | Disposition: A | Discharge status: Home / Self Care | Payer: Medicare Other | Source: Ambulatory Visit | Attending: Unknown Physician Specialty | Admitting: Unknown Physician Specialty

## 2020-11-01 ENCOUNTER — Other Ambulatory Visit: Payer: Self-pay | Admitting: Unknown Physician Specialty

## 2020-11-01 DIAGNOSIS — R051 Acute cough: Secondary | ICD-10-CM

## 2020-11-01 DIAGNOSIS — R059 Cough, unspecified: Secondary | ICD-10-CM | POA: Diagnosis not present

## 2020-11-01 DIAGNOSIS — K449 Diaphragmatic hernia without obstruction or gangrene: Secondary | ICD-10-CM | POA: Diagnosis not present

## 2020-11-20 DIAGNOSIS — M0600 Rheumatoid arthritis without rheumatoid factor, unspecified site: Secondary | ICD-10-CM | POA: Diagnosis not present

## 2020-11-20 DIAGNOSIS — M810 Age-related osteoporosis without current pathological fracture: Secondary | ICD-10-CM | POA: Diagnosis not present

## 2020-11-20 DIAGNOSIS — Z840 Family history of diseases of the skin and subcutaneous tissue: Secondary | ICD-10-CM | POA: Diagnosis not present

## 2020-11-20 DIAGNOSIS — R768 Other specified abnormal immunological findings in serum: Secondary | ICD-10-CM | POA: Diagnosis not present

## 2020-11-20 DIAGNOSIS — Z6825 Body mass index (BMI) 25.0-25.9, adult: Secondary | ICD-10-CM | POA: Diagnosis not present

## 2020-11-20 DIAGNOSIS — M25552 Pain in left hip: Secondary | ICD-10-CM | POA: Diagnosis not present

## 2020-11-20 DIAGNOSIS — Z8739 Personal history of other diseases of the musculoskeletal system and connective tissue: Secondary | ICD-10-CM | POA: Diagnosis not present

## 2020-11-20 DIAGNOSIS — E559 Vitamin D deficiency, unspecified: Secondary | ICD-10-CM | POA: Diagnosis not present

## 2020-11-20 DIAGNOSIS — M150 Primary generalized (osteo)arthritis: Secondary | ICD-10-CM | POA: Diagnosis not present

## 2020-11-20 DIAGNOSIS — E663 Overweight: Secondary | ICD-10-CM | POA: Diagnosis not present

## 2020-11-21 ENCOUNTER — Other Ambulatory Visit: Payer: Self-pay | Admitting: Physician Assistant

## 2020-11-21 DIAGNOSIS — M810 Age-related osteoporosis without current pathological fracture: Secondary | ICD-10-CM

## 2020-11-24 DIAGNOSIS — J22 Unspecified acute lower respiratory infection: Secondary | ICD-10-CM | POA: Diagnosis not present

## 2020-11-24 DIAGNOSIS — M542 Cervicalgia: Secondary | ICD-10-CM | POA: Diagnosis not present

## 2020-11-24 DIAGNOSIS — R509 Fever, unspecified: Secondary | ICD-10-CM | POA: Diagnosis not present

## 2020-11-27 DIAGNOSIS — K219 Gastro-esophageal reflux disease without esophagitis: Secondary | ICD-10-CM | POA: Diagnosis not present

## 2020-11-27 DIAGNOSIS — J340 Abscess, furuncle and carbuncle of nose: Secondary | ICD-10-CM | POA: Diagnosis not present

## 2020-12-05 ENCOUNTER — Telehealth: Payer: Self-pay | Admitting: Gastroenterology

## 2020-12-05 NOTE — Telephone Encounter (Signed)
Patient called and is having constipation and "pencil size stools."  She has questions about OTC medications to take to help with her symptoms, please call to advise.

## 2020-12-06 DIAGNOSIS — R195 Other fecal abnormalities: Secondary | ICD-10-CM | POA: Diagnosis not present

## 2020-12-06 DIAGNOSIS — E039 Hypothyroidism, unspecified: Secondary | ICD-10-CM | POA: Diagnosis not present

## 2020-12-06 DIAGNOSIS — I10 Essential (primary) hypertension: Secondary | ICD-10-CM | POA: Diagnosis not present

## 2020-12-06 DIAGNOSIS — R140 Abdominal distension (gaseous): Secondary | ICD-10-CM | POA: Diagnosis not present

## 2020-12-07 ENCOUNTER — Emergency Department (HOSPITAL_COMMUNITY): Payer: Medicare Other

## 2020-12-07 ENCOUNTER — Inpatient Hospital Stay (HOSPITAL_COMMUNITY)
Admission: EM | Admit: 2020-12-07 | Discharge: 2020-12-11 | DRG: 641 | Disposition: A | Discharge status: Home Health | Payer: Medicare Other | Attending: Internal Medicine | Admitting: Internal Medicine

## 2020-12-07 ENCOUNTER — Encounter (HOSPITAL_COMMUNITY): Payer: Self-pay

## 2020-12-07 ENCOUNTER — Other Ambulatory Visit: Payer: Self-pay

## 2020-12-07 DIAGNOSIS — Z8673 Personal history of transient ischemic attack (TIA), and cerebral infarction without residual deficits: Secondary | ICD-10-CM

## 2020-12-07 DIAGNOSIS — M2669 Other specified disorders of temporomandibular joint: Secondary | ICD-10-CM | POA: Diagnosis present

## 2020-12-07 DIAGNOSIS — Z66 Do not resuscitate: Secondary | ICD-10-CM | POA: Diagnosis present

## 2020-12-07 DIAGNOSIS — Z974 Presence of external hearing-aid: Secondary | ICD-10-CM

## 2020-12-07 DIAGNOSIS — K5900 Constipation, unspecified: Secondary | ICD-10-CM | POA: Diagnosis present

## 2020-12-07 DIAGNOSIS — Z79890 Hormone replacement therapy (postmenopausal): Secondary | ICD-10-CM

## 2020-12-07 DIAGNOSIS — Z888 Allergy status to other drugs, medicaments and biological substances status: Secondary | ICD-10-CM

## 2020-12-07 DIAGNOSIS — D75839 Thrombocytosis, unspecified: Secondary | ICD-10-CM | POA: Diagnosis present

## 2020-12-07 DIAGNOSIS — H9190 Unspecified hearing loss, unspecified ear: Secondary | ICD-10-CM | POA: Diagnosis present

## 2020-12-07 DIAGNOSIS — Z20822 Contact with and (suspected) exposure to covid-19: Secondary | ICD-10-CM | POA: Diagnosis not present

## 2020-12-07 DIAGNOSIS — Z7982 Long term (current) use of aspirin: Secondary | ICD-10-CM

## 2020-12-07 DIAGNOSIS — E871 Hypo-osmolality and hyponatremia: Principal | ICD-10-CM | POA: Diagnosis present

## 2020-12-07 DIAGNOSIS — M810 Age-related osteoporosis without current pathological fracture: Secondary | ICD-10-CM | POA: Diagnosis present

## 2020-12-07 DIAGNOSIS — E861 Hypovolemia: Secondary | ICD-10-CM | POA: Diagnosis present

## 2020-12-07 DIAGNOSIS — Y929 Unspecified place or not applicable: Secondary | ICD-10-CM

## 2020-12-07 DIAGNOSIS — E875 Hyperkalemia: Secondary | ICD-10-CM | POA: Diagnosis present

## 2020-12-07 DIAGNOSIS — I10 Essential (primary) hypertension: Secondary | ICD-10-CM | POA: Diagnosis not present

## 2020-12-07 DIAGNOSIS — N179 Acute kidney failure, unspecified: Secondary | ICD-10-CM

## 2020-12-07 DIAGNOSIS — M069 Rheumatoid arthritis, unspecified: Secondary | ICD-10-CM | POA: Diagnosis present

## 2020-12-07 DIAGNOSIS — T380X5A Adverse effect of glucocorticoids and synthetic analogues, initial encounter: Secondary | ICD-10-CM | POA: Diagnosis present

## 2020-12-07 DIAGNOSIS — D72829 Elevated white blood cell count, unspecified: Secondary | ICD-10-CM | POA: Diagnosis present

## 2020-12-07 DIAGNOSIS — Z79899 Other long term (current) drug therapy: Secondary | ICD-10-CM

## 2020-12-07 DIAGNOSIS — E860 Dehydration: Secondary | ICD-10-CM | POA: Diagnosis present

## 2020-12-07 DIAGNOSIS — E039 Hypothyroidism, unspecified: Secondary | ICD-10-CM | POA: Diagnosis present

## 2020-12-07 DIAGNOSIS — Z7952 Long term (current) use of systemic steroids: Secondary | ICD-10-CM

## 2020-12-07 DIAGNOSIS — R109 Unspecified abdominal pain: Secondary | ICD-10-CM | POA: Diagnosis not present

## 2020-12-07 DIAGNOSIS — M0600 Rheumatoid arthritis without rheumatoid factor, unspecified site: Secondary | ICD-10-CM | POA: Diagnosis present

## 2020-12-07 LAB — CBC
HCT: 38.5 % (ref 36.0–46.0)
Hemoglobin: 13 g/dL (ref 12.0–15.0)
MCH: 29.5 pg (ref 26.0–34.0)
MCHC: 33.8 g/dL (ref 30.0–36.0)
MCV: 87.5 fL (ref 80.0–100.0)
Platelets: 468 10*3/uL — ABNORMAL HIGH (ref 150–400)
RBC: 4.4 MIL/uL (ref 3.87–5.11)
RDW: 12.1 % (ref 11.5–15.5)
WBC: 14.2 10*3/uL — ABNORMAL HIGH (ref 4.0–10.5)
nRBC: 0 % (ref 0.0–0.2)

## 2020-12-07 LAB — URINALYSIS, ROUTINE W REFLEX MICROSCOPIC
Bacteria, UA: NONE SEEN
Bilirubin Urine: NEGATIVE
Glucose, UA: NEGATIVE mg/dL
Hgb urine dipstick: NEGATIVE
Ketones, ur: NEGATIVE mg/dL
Nitrite: NEGATIVE
Protein, ur: NEGATIVE mg/dL
Specific Gravity, Urine: 1.019 (ref 1.005–1.030)
pH: 5 (ref 5.0–8.0)

## 2020-12-07 LAB — BASIC METABOLIC PANEL
Anion gap: 13 (ref 5–15)
BUN: 30 mg/dL — ABNORMAL HIGH (ref 8–23)
CO2: 19 mmol/L — ABNORMAL LOW (ref 22–32)
Calcium: 8.8 mg/dL — ABNORMAL LOW (ref 8.9–10.3)
Chloride: 89 mmol/L — ABNORMAL LOW (ref 98–111)
Creatinine, Ser: 1.15 mg/dL — ABNORMAL HIGH (ref 0.44–1.00)
GFR, Estimated: 45 mL/min — ABNORMAL LOW (ref >60)
Glucose, Bld: 116 mg/dL — ABNORMAL HIGH (ref 70–99)
Potassium: 4.9 mmol/L (ref 3.5–5.1)
Sodium: 121 mmol/L — ABNORMAL LOW (ref 135–145)

## 2020-12-07 MED ORDER — SODIUM CHLORIDE 0.9 % IV BOLUS
1000.0000 mL | Freq: Once | INTRAVENOUS | Status: AC
  Administered 2020-12-07: 1000 mL via INTRAVENOUS

## 2020-12-07 NOTE — ED Provider Notes (Signed)
[facility] DEPT Provider Note   CSN: [id number] Arrival date & time: 12/07/20  1819     History Chief Complaint  Patient presents with  . Weakness  . Abnormal Lab    [name] is a 87 y.o. female.  HPI   Patient is a 87 year old female who presents to the emergency department due to abnormal labs.  Patient had new labs obtained by her PCP yesterday.  They were notified today that her sodium was 122 and her potassium was 6.2.  Patient was also noted to have leukocytosis of 15.9.  Patient's daughter is a Equities trader and is at bedside.  She provides most of the history due to patient's hearing loss.  States that the patient has a history of rheumatoid arthritis and typically takes 3 mg of prednisone daily.  For the last couple of weeks she has been experiencing worsening pain in the jaw, mouth, as well as her neck.  They have a family friend who is an ENT who evaluated her.  She was started on Bactrim as well as an additional prednisone taper in addition to her chronic prednisone therapy.  Her daughter notes that due to her pain she has also had very poor p.o. intake for the past 2 weeks.  They also note a history of IBS.  Patient has been experiencing constipation for the past few months.  Intermittently takes MiraLAX.  Also started taking ClearLax about 1 week ago.  Denies any significant relief.  States she has been having pencillike stools.  They note that during her evaluation yesterday rectal exam was performed and patient was guaiac negative.     Past Medical History:  Diagnosis Date  . Arthritis   . Carpal tunnel syndrome   . Gait abnormality    has for 93yr-no use of cane  . Hx of TIA (transient ischemic attack) and stroke   . Hypertension   . Hypothyroidism   . Osteoporosis   . Wears glasses   . Wears hearing aid     Patient Active Problem List   Diagnosis Date Noted  . Forearm laceration, left, initial encounter 03/08/2019  .  Counseling regarding end of life decision making 11/27/2018  . Osteoporosis, post-menopausal 08/04/2018  . Community acquired pneumonia 04/26/2018  . Acquired hypothyroidism 11/30/2017  . Seronegative rheumatoid arthritis ([place]) 11/30/2017  . Constipation 04/14/2017  . Left anterior knee pain 11/03/2016  . TIA (transient ischemic attack) 04/15/2016  . Family history of breast cancer in first degree relative 03/10/2016  . Osteoarthritis, shoulder 11/30/2015  . Essential hypertension 11/30/2015  . Vitamin D deficiency 11/30/2015  . 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 Age-related cognitive decline 09/09/2014  . History of polymyalgia rheumatica 09/09/2014  . Long-term use of high-risk medication 09/09/2014    Past Surgical History:  Procedure Laterality Date  . APPENDECTOMY    . CARPAL TUNNEL RELEASE Left 07/11/2013   Procedure: CARPAL TUNNEL RELEASE;  Surgeon: Cammie Sickle., MD;  Location: [facility];  Service: Orthopedics;  Laterality: Left;  . COLONOSCOPY    . SUBDURAL HEMATOMA EVACUATION VIA CRANIOTOMY  2007  . TONSILLECTOMY       OB History   No obstetric history on file.     Family History  Problem Relation Age of Onset  . Cancer Mother 7       breast ca  . Cancer Maternal Aunt   . Cancer Brother        pancreatic ca  .  Breast cancer Sister   . Brain cancer Brother        astrocytoma?  . Kidney cancer Sister     Social History   Tobacco Use  . Smoking status: Never Smoker  . Smokeless tobacco: Never Used  Vaping Use  . Vaping Use: Never used  Substance Use Topics  . Alcohol use: Yes    Comment: occ  . Drug use: No    Home Medications Prior to Admission medications   Medication Sig Start Date End Date Taking? Authorizing Provider  aspirin EC 81 MG tablet Take 81 mg by mouth every evening.   Yes [provider]  cholecalciferol (VITAMIN D) 1000 units tablet Take 1,000 Units by mouth daily.   Yes [provider]  gentamicin ointment (GARAMYCIN) 0.1 % Apply 1 application topically 3 (three) times daily. 11/27/20  Yes [provider]  levothyroxine (SYNTHROID) 100 MCG tablet TAKE ONE TABLET ONCE A DAY ON AN EMPTY STOMACH. Patient needs appointment to receive further refills. 06/05/20  Yes Crecencio Mc, MD  losartan (COZAAR) 100 MG tablet TAKE 1 TABLET ONCE DAILY. 10/25/19  Yes Crecencio Mc, MD  metoprolol succinate (TOPROL-XL) 50 MG 24 hr tablet TAKE 1 TABLET ONCE DAILY IN THE EVENING. 11/08/19  Yes Crecencio Mc, MD  omeprazole (PRILOSEC) 40 MG capsule Take 40 mg by mouth every evening. 10/31/20  Yes [provider]  predniSONE (DELTASONE) 10 MG tablet Take 10-60 mg by mouth See admin instructions. Prednisone Taper  Take 6 tablets on Day 1 Take 5 tablets on Day 2 Take 4 tablets on Day 3 Take 3 tablets on Day 4 Take 2 tablets on Day 5 Take 1 tablet on Day 6 tablets 11/27/20  Yes [provider]  sulfamethoxazole-trimethoprim (BACTRIM DS) 800-160 MG tablet Take 1 tablet by mouth 2 (two) times daily. 11/27/20  Yes [provider]  diphenhydrAMINE (BENADRYL) 25 MG tablet Take 12.5 mg by mouth at bedtime as needed for sleep or allergies.    [provider]  predniSONE (DELTASONE) 1 MG tablet Take 3 mg by mouth daily. 05/12/17   [provider]    Allergies    Pollen extract and Levofloxacin  Review of Systems   Review of Systems  All other systems reviewed and are negative. Ten systems reviewed and are negative for acute change, except as noted in the HPI.   Physical Exam Updated Vital Signs BP (!) 181/68   Pulse 63   Temp (!) 97.4 F (36.3 C) (Oral)   Resp 15   Ht 5' 2.5" (1.588 m)   Wt 62.1 kg   SpO2 100%   BMI 24.66 kg/m   Physical Exam Vitals and nursing note reviewed.  Constitutional:      General: She is not in acute distress.    Appearance: Normal appearance. She is not ill-appearing, toxic-appearing or diaphoretic.  HENT:      Head: Normocephalic and atraumatic.     Right Ear: External ear normal.     Left Ear: External ear normal.     Nose: Nose normal.     Mouth/Throat:     Mouth: Mucous membranes are moist.     Pharynx: Oropharynx is clear. No oropharyngeal exudate or posterior oropharyngeal erythema.  Eyes:     Extraocular Movements: Extraocular movements intact.  Cardiovascular:     Rate and Rhythm: Normal rate and regular rhythm.     Pulses: Normal pulses.     Heart sounds: Normal heart sounds.  No murmur heard. No friction rub. No gallop.   Pulmonary:     Effort: Pulmonary effort is normal. No respiratory distress.     Breath sounds: Normal breath sounds. No stridor. No wheezing, rhonchi or rales.  Abdominal:     General: Abdomen is flat. Bowel sounds are normal. There is no distension.     Palpations: Abdomen is soft.     Tenderness: There is no abdominal tenderness.     Comments: Normoactive bowel sounds.  Abdomen is flat, soft, and nontender in all 4 quadrants.  Musculoskeletal:        General: Normal range of motion.     Cervical back: Normal range of motion and neck supple. No tenderness.     Right lower leg: No edema.     Left lower leg: No edema.  Skin:    General: Skin is warm and dry.  Neurological:     General: No focal deficit present.     Mental Status: She is alert and oriented to person, place, and time.  Psychiatric:        Mood and Affect: Mood normal.        Behavior: Behavior normal.    ED Results / Procedures / Treatments   Labs (all labs ordered are listed, but only abnormal results are displayed) Labs Reviewed  BASIC METABOLIC PANEL - Abnormal; Notable for the following components:      Result Value   Sodium 121 (*)    Chloride 89 (*)    CO2 19 (*)    Glucose, Bld 116 (*)    BUN 30 (*)    Creatinine, Ser 1.15 (*)    Calcium 8.8 (*)    GFR, Estimated 45 (*)    All other components within normal limits  CBC - Abnormal; Notable for the following components:    WBC 14.2 (*)    Platelets 468 (*)    All other components within normal limits  URINALYSIS, ROUTINE W REFLEX MICROSCOPIC - Abnormal; Notable for the following components:   APPearance TURBID (*)    Leukocytes,Ua MODERATE (*)    Non Squamous Epithelial 0-5 (*)    Crystals PRESENT (*)    All other components within normal limits  RESP PANEL BY RT-PCR (FLU A&B, COVID) [id number]   EKG None  Radiology DG Abd Portable 1 View  Result Date: 12/07/2020 CLINICAL DATA:  PCP called today to inform pt that sodium levels at 122, potassium at 6.9, and WBC at 15.9. Severe constipation recently. Denies any abdominal pain. EXAM: PORTABLE ABDOMEN - 1 VIEW COMPARISON:  12/06/2020 FINDINGS: There is no bowel dilation to suggest obstruction. Colonic stool appears mildly increased, similar to the prior radiographs. Soft tissues are unremarkable. No acute or significant skeletal abnormality. Lung bases are clear. IMPRESSION: 1. No acute findings. 2. Mild increased colonic stool burden similar to the previous day's study. Electronically Signed   By: Lajean Manes M.D.   On: 12/07/2020 21:07   Procedures Procedures   Medications Ordered in ED Medications - No data to display  ED Course  I have reviewed the triage vital signs and the nursing notes.  Pertinent labs & imaging results that were available during my care of the patient were reviewed by me and considered in my medical decision making (see chart for details).  Clinical Course as of 12/07/20 2313  Sat Dec 07, 2020  2129 Crystals(!): PRESENT [LJ]  2129 Non Squamous Epithelial(!): 0-5 [LJ]  2129 Chalmers Guest): MODERATE [LJ]  2129 WBCMarland Kitchen):  14.2 [LJ]  2129 Platelets(!): 468 [LJ]  2252 Sodium(!): 121 [LJ]    Clinical Course User Index [LJ] Rayna Sexton, PA-C   MDM Rules/Calculators/A&P                          Pt is a 87 y.o. female who presents the emergency department due to hyponatremia as well as hyperkalemia that was noted by her PCP  during a routine office visit yesterday.  Labs: CBC with leukocytosis of 14.2 as well as thrombocytosis of 468. UA showing moderate leukocytes, 21-50 white blood cells. BMP showing hyponatremia of 121, hypochloremia of 89, BUN of 30, creatinine of 1.15, calcium of 8.8, GFR 45.  Imaging: Abdominal x-ray showing mild increased colonic stool burden.  No acute findings.  I, Rayna Sexton, PA-C, personally reviewed and evaluated these images and lab results as part of my medical decision-making.  Patient has a history of chronic constipation and has been more constipated for the past few months.  Started taking ClearLax 1 week ago.  Otherwise, no complaints by the patient today.  No abdominal pain.  No chest pain or shortness of breath.  No increasing fatigue.  Unsure of the cause of patient's hyponatremia.  She does have a history of RA and takes 3 mg of prednisone at baseline and recently was started on a prednisone taper.  Sodium today is 121.  Will admit to the medicine team for further work-up and management.  Note: Portions of this report may have been transcribed using voice recognition software. Every effort was made to ensure accuracy; however, inadvertent computerized transcription errors may be present.   Final Clinical Impression(s) / ED Diagnoses Final diagnoses:  Hyponatremia    Rx / DC Orders ED Discharge Orders    None       Rayna Sexton, PA-C 12/07/20 2313    Charlesetta Shanks, MD 12/16/20 [phone]

## 2020-12-07 NOTE — ED Notes (Signed)
Pt provided w/labeled specimen cup for urine collection. [employer]

## 2020-12-07 NOTE — ED Notes (Signed)
Pt given graham crackers and peanut butter. Pt's daughter still at bedside.

## 2020-12-07 NOTE — ED Triage Notes (Addendum)
Pts daughter reports pt had bloodwork done at PCP yesterday. PCP called today and said her sodium was 122, potassium was 6.9, and WBC was 15.9. Pts daughter states pt has experienced some weakness recently and has been on tapered prednisone for neck pain. Pts daughter also states that pt has severe constipation. Pt denies chest pain, SHOB, and dizziness. Pt A&O x4.

## 2020-12-07 NOTE — ED Notes (Addendum)
Pt given water 

## 2020-12-08 DIAGNOSIS — I10 Essential (primary) hypertension: Secondary | ICD-10-CM | POA: Diagnosis not present

## 2020-12-08 DIAGNOSIS — N179 Acute kidney failure, unspecified: Secondary | ICD-10-CM | POA: Diagnosis not present

## 2020-12-08 DIAGNOSIS — K5900 Constipation, unspecified: Secondary | ICD-10-CM | POA: Diagnosis not present

## 2020-12-08 DIAGNOSIS — E871 Hypo-osmolality and hyponatremia: Secondary | ICD-10-CM | POA: Diagnosis not present

## 2020-12-08 DIAGNOSIS — M0600 Rheumatoid arthritis without rheumatoid factor, unspecified site: Secondary | ICD-10-CM

## 2020-12-08 DIAGNOSIS — E039 Hypothyroidism, unspecified: Secondary | ICD-10-CM | POA: Diagnosis not present

## 2020-12-08 LAB — BASIC METABOLIC PANEL
Anion gap: 6 (ref 5–15)
Anion gap: 7 (ref 5–15)
BUN: 24 mg/dL — ABNORMAL HIGH (ref 8–23)
BUN: 23 mg/dL (ref 8–23)
CO2: 24 mmol/L (ref 22–32)
CO2: 23 mmol/L (ref 22–32)
Calcium: 7.9 mg/dL — ABNORMAL LOW (ref 8.9–10.3)
Calcium: 8 mg/dL — ABNORMAL LOW (ref 8.9–10.3)
Chloride: 94 mmol/L — ABNORMAL LOW (ref 98–111)
Chloride: 91 mmol/L — ABNORMAL LOW (ref 98–111)
Creatinine, Ser: 1.01 mg/dL — ABNORMAL HIGH (ref 0.44–1.00)
Creatinine, Ser: 0.9 mg/dL (ref 0.44–1.00)
GFR, Estimated: 53 mL/min — ABNORMAL LOW (ref >60)
GFR, Estimated: >60 mL/min (ref >60)
Glucose, Bld: 97 mg/dL (ref 70–99)
Glucose, Bld: 169 mg/dL — ABNORMAL HIGH (ref 70–99)
Potassium: 4.8 mmol/L (ref 3.5–5.1)
Potassium: 4.9 mmol/L (ref 3.5–5.1)
Sodium: 124 mmol/L — ABNORMAL LOW (ref 135–145)
Sodium: 121 mmol/L — ABNORMAL LOW (ref 135–145)

## 2020-12-08 LAB — OSMOLALITY, URINE: Osmolality, Ur: 611 mOsm/kg (ref 300–900)

## 2020-12-08 LAB — CBC
HCT: 35.6 % — ABNORMAL LOW (ref 36.0–46.0)
Hemoglobin: 11.8 g/dL — ABNORMAL LOW (ref 12.0–15.0)
MCH: 29.4 pg (ref 26.0–34.0)
MCHC: 33.1 g/dL (ref 30.0–36.0)
MCV: 88.8 fL (ref 80.0–100.0)
Platelets: 356 10*3/uL (ref 150–400)
RBC: 4.01 MIL/uL (ref 3.87–5.11)
RDW: 12.2 % (ref 11.5–15.5)
WBC: 12.8 10*3/uL — ABNORMAL HIGH (ref 4.0–10.5)
nRBC: 0 % (ref 0.0–0.2)

## 2020-12-08 LAB — RESP PANEL BY RT-PCR (FLU A&B, COVID) ARPGX2
Influenza A by PCR: NEGATIVE
Influenza B by PCR: NEGATIVE
SARS Coronavirus 2 by RT PCR: NEGATIVE

## 2020-12-08 LAB — NA AND K (SODIUM & POTASSIUM), RAND UR
Potassium Urine: 80 mmol/L
Sodium, Ur: 37 mmol/L

## 2020-12-08 MED ORDER — METOPROLOL SUCCINATE ER 50 MG PO TB24
50.0000 mg | ORAL_TABLET | Freq: Every day | ORAL | Status: DC
Start: 2020-12-08 — End: 2020-12-11
  Administered 2020-12-08 – 2020-12-10 (×3): 50 mg via ORAL
  Filled 2020-12-08 (×4): qty 1

## 2020-12-08 MED ORDER — PANTOPRAZOLE SODIUM 40 MG PO TBEC
40.0000 mg | DELAYED_RELEASE_TABLET | Freq: Every day | ORAL | Status: DC
  Administered 2020-12-08 – 2020-12-11 (×4): 40 mg via ORAL
  Filled 2020-12-08 (×4): qty 1

## 2020-12-08 MED ORDER — SODIUM CHLORIDE 1 G PO TABS
1.0000 g | ORAL_TABLET | Freq: Three times a day (TID) | ORAL | Status: DC
  Administered 2020-12-08 – 2020-12-11 (×8): 1 g via ORAL
  Filled 2020-12-08 (×10): qty 1

## 2020-12-08 MED ORDER — POLYETHYLENE GLYCOL 3350 17 G PO PACK
17.0000 g | PACK | Freq: Every day | ORAL | Status: DC
  Administered 2020-12-08 – 2020-12-11 (×4): 17 g via ORAL
  Filled 2020-12-08 (×4): qty 1

## 2020-12-08 MED ORDER — ENOXAPARIN SODIUM 30 MG/0.3ML ~~LOC~~ SOLN
30.0000 mg | SUBCUTANEOUS | Status: DC
  Administered 2020-12-08: 30 mg via SUBCUTANEOUS
  Filled 2020-12-08: qty 0.3

## 2020-12-08 MED ORDER — ACETAMINOPHEN 325 MG PO TABS
650.0000 mg | ORAL_TABLET | Freq: Four times a day (QID) | ORAL | Status: DC | PRN
  Administered 2020-12-08 – 2020-12-10 (×3): 650 mg via ORAL
  Filled 2020-12-08 (×3): qty 2

## 2020-12-08 MED ORDER — PREDNISONE 1 MG PO TABS
3.0000 mg | ORAL_TABLET | Freq: Every day | ORAL | Status: DC
  Administered 2020-12-10 – 2020-12-11 (×2): 3 mg via ORAL
  Filled 2020-12-08 (×3): qty 3

## 2020-12-08 MED ORDER — LEVOTHYROXINE SODIUM 100 MCG PO TABS
100.0000 ug | ORAL_TABLET | Freq: Every day | ORAL | Status: DC
  Administered 2020-12-08 – 2020-12-11 (×4): 100 ug via ORAL
  Filled 2020-12-08 (×4): qty 1

## 2020-12-08 MED ORDER — VITAMIN D 25 MCG (1000 UNIT) PO TABS
1000.0000 [IU] | ORAL_TABLET | Freq: Every day | ORAL | Status: DC
  Administered 2020-12-08 – 2020-12-11 (×4): 1000 [IU] via ORAL
  Filled 2020-12-08 (×4): qty 1

## 2020-12-08 MED ORDER — ASPIRIN EC 81 MG PO TBEC
81.0000 mg | DELAYED_RELEASE_TABLET | Freq: Every evening | ORAL | Status: DC
  Administered 2020-12-08 – 2020-12-10 (×3): 81 mg via ORAL
  Filled 2020-12-08 (×3): qty 1

## 2020-12-08 MED ORDER — DOCUSATE SODIUM 100 MG PO CAPS
100.0000 mg | ORAL_CAPSULE | Freq: Every day | ORAL | Status: DC
  Administered 2020-12-08 – 2020-12-11 (×4): 100 mg via ORAL
  Filled 2020-12-08 (×4): qty 1

## 2020-12-08 MED ORDER — PREDNISONE 5 MG PO TABS
10.0000 mg | ORAL_TABLET | Freq: Once | ORAL | Status: AC
  Administered 2020-12-08: 10 mg via ORAL
  Filled 2020-12-08: qty 2

## 2020-12-08 MED ORDER — SODIUM CHLORIDE 0.9 % IV SOLN: INTRAVENOUS | Status: DC

## 2020-12-08 NOTE — Progress Notes (Signed)
PROGRESS NOTE    [name]  JKD:[id number] DOB: [birth year] DOA: 85/09[birth year]21 PCP: Marda Stalker, PA-C   Chief Complaint  Patient presents with  . Weakness  . Abnormal Lab  Brief Narrative: 87 year old female with RA on chronic prednisone 3 mg, TIA, HTN, osteoporosis presented with abnormal labs low sodium from her PCP office. As per the report has been having ongoing issues with constipation despite doing MiraLAX and drinking prune juice.  She presented to her primary physician with the symptoms and also had labs drawn.  Her labs returned with significant hyponatremia of 122, potassium of around 6.2 and elevated creatinine of 1.3 she was advised to present to the ED. patient has been having TMJ pain seen by ENT and was placed on Bactrim for possible sinusitis even higher dose of prednisone taper just about to finish and soft diet x2 weeks and has only been able to drink soup and soft foods. In the ED had leukocytosis, low sodium 121 and was admitted.  Her UA was abnormal but without urinary symptoms.  She was given normal saline.  X-ray showed increased colonic stool burden  Subjective: Seen and examined this morning.  Daughter at the bedside. No new complaints no dysuria urinary frequency fever Daughter at the bedside.  Patient is hard of hearing.  Assessment & Plan:  Hyponatremia, suspect multifactorial due to hypovolemia dehydration, also has been taking Bactrim.  With normal saline sodium has improved to 124. Added urine sodium and osmole on previous sample, urine sodium 37  (< 40), osmole pending.  We will continue with IV normal saline challenge respectable per hour repeat labs today pending.  AKI likely with poor oral intake dehydration/Bactrim:creatinine improved with IV fluids continue gentle fluid hydration.  Hold losartan for today Recent Labs  Lab 12/07/20 1925 12/08/20 0522  BUN 30* 24*  CREATININE 1.15* 1.01*   Leukocytosis likely from her steroid use.  She has  completed Bactrim follow-up on antibiotics.  Abnormal UA no urinary symptoms.  Culture pending likely asymptomatic bacteriuria  Essential hypertension: Blood pressure stable continue metoprolol  Constipation: Had good bowel movement this morning.  We will keep on MiraLAX  RA on chronic prednisone just finished her taper resume 3 mg from a.m.  Acquired hypothyroidism: Continue Synthroid  TMJ dysfunction/pain with history of RA: She finished her prednisone taper today.  Was also on Bactrim by ENT was finishing her course.  At this time no issues.  No further Bactrim given issue with hyponatremia and renal failure/hyperkalemia  Deconditioning will obtain PT OT evaluation. Nutrition: Diet Order            Diet Heart Room service appropriate? Yes; Fluid consistency: Thin  Diet effective now               Pt's Body mass index is 24.66 kg/m. DVT prophylaxis: enoxaparin (LOVENOX) injection 30 mg Start: 12/08/20 1000 Code Status:   Code Status: DNR  Family Communication: plan of care discussed with patient at bedside.  Status is: admitted as Observation Continues to require further hospitalization for ongoing management of her hyponatremia with IV fluids and monitoring of electrolytes  Dispo: The patient is from: Home              Anticipated d/c is to: Home              Anticipated d/c date is: 85 day              Patient currently is not medically stable to  d/c.   Difficult to place patient No  Consultants:see note  Procedures:see note  Unresulted Labs (From admission, onward)          Start     Ordered   12/09/20 6834  Basic metabolic panel  Daily,   R     Question:  Specimen collection method  Answer:  Lab=Lab collect   12/08/20 0829   12/09/20 0500  CBC  Daily,   R     Question:  Specimen collection method  Answer:  Lab=Lab collect   12/08/20 0829   12/08/20 1962  Basic metabolic panel  Once-Timed,   TIMED       Question:  Specimen collection method  Answer:  Lab=Lab  collect   12/08/20 0829   12/08/20 0828  Osmolality, urine  Add-on,   AD        12/08/20 0827   12/08/20 0822  Culture, Urine  Add-on,   AD        12/08/20 0821          Culture/Microbiology    Component Value Date/Time   SDES URINE, RANDOM 04/14/2016 1929   SPECREQUEST NONE 04/14/2016 1929   CULT MULTIPLE SPECIES PRESENT, SUGGEST RECOLLECTION (A) 04/14/2016 1929   REPTSTATUS 04/16/2016 FINAL 04/14/2016 1929    Other culture-see note  Medications: Scheduled Meds: . aspirin EC  81 mg Oral QPM  . cholecalciferol  1,000 Units Oral Daily  . docusate sodium  100 mg Oral Daily  . enoxaparin (LOVENOX) injection  30 mg Subcutaneous Q24H  . levothyroxine  100 mcg Oral Q0600  . metoprolol succinate  50 mg Oral Daily  . pantoprazole  40 mg Oral Daily  . polyethylene glycol  17 g Oral Daily  . [START ON 12/09/2020] predniSONE  3 mg Oral Q breakfast   Continuous Infusions: . sodium chloride 50 mL/hr at 12/08/20 2297    Antimicrobials: Anti-infectives (From admission, onward)   None     Objective: Vitals: Today's Vitals   12/08/20 0148 12/08/20 0621 12/08/20 0810 12/08/20 1002  BP:  (!) 145/56  (!) 137/57  Pulse:  (!) 58  62  Resp:  18  16  Temp:  (!) 97.4 F (36.3 C)  97.7 F (36.5 C)  TempSrc:  Oral  Oral  SpO2:  100%  97%  Weight:      Height:      PainSc: 0-No pain  2      Intake/Output Summary (Last 24 hours) at 12/08/2020 1051 Last data filed at 12/08/2020 1000 Gross per 24 hour  Intake 1360 ml  Output 0 ml  Net 1360 ml   Filed Weights   12/07/20 1837  Weight: 62.1 kg   Weight change:   Intake/Output from previous day: 02/12 0701 - 02/13 0700 In: 1240 [P.O.:240; IV Piggyback:1000] Out: -  Intake/Output this shift: Total I/O In: 120 [P.O.:120] Out: 0  Filed Weights   12/07/20 1837  Weight: 62.1 kg    Examination: General exam: AAOx3, ill looking, 87elderly, frail. HEENT:Oral mucosa appears dry, decreased skin turgor, Ear/Nose WNL  grossly,dentition normal. Respiratory system: bilaterally diminished,no wheezing or crackles,no use of accessory muscle, non tender. Cardiovascular system: S1 & S2 +, regular, No JVD. Gastrointestinal system: Abdomen soft, NT,ND, BS+. Nervous System:Alert, awake, moving extremities and grossly nonfocal Extremities: No edema, distal peripheral pulses palpable.  Skin: No rashes,no icterus. MSK: Normal muscle bulk,tone, power   Data Reviewed: I have personally reviewed following labs and imaging studies CBC: Recent Labs  Lab 12/07/20  1925 12/08/20 0522  WBC 14.2* 12.8*  HGB 13.0 11.8*  HCT 38.5 35.6*  MCV 87.5 88.8  PLT 468* 132   Basic Metabolic Panel: Recent Labs  Lab 12/07/20 1925 12/08/20 0522  NA 121* 124*  K 4.9 4.8  CL 89* 94*  CO2 19* 24  GLUCOSE 116* 97  BUN 30* 24*  CREATININE 1.15* 1.01*  CALCIUM 8.8* 7.9*   GFR: Estimated Creatinine Clearance: 31.8 mL/min (A) (by C-G formula based on SCr of 1.01 mg/dL (H)). Liver Function Tests: No results for input(s): AST, ALT, ALKPHOS, BILITOT, PROT, ALBUMIN in the last 168 hours. No results for input(s): LIPASE, AMYLASE in the last 168 hours. No results for input(s): AMMONIA in the last 168 hours. Coagulation Profile: No results for input(s): INR, PROTIME in the last 168 hours. Cardiac Enzymes: No results for input(s): CKTOTAL, CKMB, CKMBINDEX, TROPONINI in the last 168 hours. BNP (last 3 results) No results for input(s): PROBNP in the last 8760 hours. HbA1C: No results for input(s): HGBA1C in the last 72 hours. CBG: No results for input(s): GLUCAP in the last 168 hours. Lipid Profile: No results for input(s): CHOL, HDL, LDLCALC, TRIG, CHOLHDL, LDLDIRECT in the last 72 hours. Thyroid Function Tests: No results for input(s): TSH, T4TOTAL, FREET4, T3FREE, THYROIDAB in the last 72 hours. Anemia Panel: No results for input(s): VITAMINB12, FOLATE, FERRITIN, TIBC, IRON, RETICCTPCT in the last 72 hours. Sepsis Labs: No  results for input(s): PROCALCITON, LATICACIDVEN in the last 168 hours.  Recent Results (from the past 240 hour(s))  Resp Panel by RT-PCR (Flu A&B, Covid) Nasopharyngeal Swab     Status: None   Collection Time: 12/07/20 11:13 PM   Specimen: Nasopharyngeal Swab; Nasopharyngeal(NP) swabs in vial transport medium  Result Value Ref Range Status   SARS Coronavirus 2 by RT PCR NEGATIVE NEGATIVE Final    Comment: (NOTE) SARS-CoV-2 target nucleic acids are NOT DETECTED.  The SARS-CoV-2 RNA is generally detectable in upper respiratory specimens during the acute phase of infection. The lowest concentration of SARS-CoV-2 viral copies this assay can detect is 138 copies/mL. A negative result does not preclude SARS-Cov-2 infection and should not be used as the sole basis for treatment or other patient management decisions. A negative result may occur with  improper specimen collection/handling, submission of specimen other than nasopharyngeal swab, presence of viral mutation(s) within the areas targeted by this assay, and inadequate number of viral copies(<138 copies/mL). A negative result must be combined with clinical observations, patient history, and epidemiological information. The expected result is Negative.  Fact Sheet for Patients:  EntrepreneurPulse.com.au  Fact Sheet for Healthcare Providers:  IncredibleEmployment.be  This test is no t yet approved or cleared by the [place] FDA and  has been authorized for detection and/or diagnosis of SARS-CoV-2 by FDA under an Emergency Use Authorization (EUA). This EUA will remain  in effect (meaning this test can be used) for the duration of the COVID-19 declaration under Section 564(b)(1) of the Act, 21 U.S.C.section 360bbb-3(b)(1), unless the authorization is terminated  or revoked sooner.       Influenza A by PCR NEGATIVE NEGATIVE Final   Influenza B by PCR NEGATIVE NEGATIVE Final    Comment:  (NOTE) The Xpert Xpress SARS-CoV-2/FLU/RSV plus assay is intended as an aid in the diagnosis of influenza from Nasopharyngeal swab specimens and should not be used as a sole basis for treatment. Nasal washings and aspirates are unacceptable for Xpert Xpress SARS-CoV-2/FLU/RSV testing.  Fact Sheet for Patients: EntrepreneurPulse.com.au  Fact  Sheet for Healthcare Providers: IncredibleEmployment.be  This test is not yet approved or cleared by the [place] and has been authorized for detection and/or diagnosis of SARS-CoV-2 by FDA under an Emergency Use Authorization (EUA). This EUA will remain in effect (meaning this test can be used) for the duration of the COVID-19 declaration under Section 564(b)(1) of the Act, 21 U.S.C. section 360bbb-3(b)(1), unless the authorization is terminated or revoked.  Performed at [facility], [place] [address] [place], [place] [address]      Radiology Studies: DG Abd Portable 1 View  Result Date: 12/07/2020 CLINICAL DATA:  PCP called today to inform pt that sodium levels at 122, potassium at 6.9, and WBC at 15.9. Severe constipation recently. Denies any abdominal pain. EXAM: PORTABLE ABDOMEN - 1 VIEW COMPARISON:  12/06/2020 FINDINGS: There is no bowel dilation to suggest obstruction. Colonic stool appears mildly increased, similar to the prior radiographs. Soft tissues are unremarkable. No acute or significant skeletal abnormality. Lung bases are clear. IMPRESSION: 1. No acute findings. 2. Mild increased colonic stool burden similar to the previous day's study. Electronically Signed   By: Lajean Manes M.D.   On: 12/07/2020 21:07     LOS: 0 days   Antonieta Pert, MD [facility]  12/08/2020, 10:51 AM

## 2020-12-08 NOTE — ED Notes (Signed)
Report called to [facility].

## 2020-12-08 NOTE — H&P (Signed)
History and Physical    SISSY GOETZKE NWG:[id number] DOB: 1927/08/31 DOA: 12/07/2020  PCP: Marda Stalker, PA-C  Patient coming from: Home, daughter at bedside  I have personally briefly reviewed patient's old medical records in [place]  Chief Complaint: Abnormal labs  HPI: [name] is a 87 y.o. female with medical history significant for rheumatoid arthritis on chronic prednisone 3 mg, TIA, hypertension, osteoporosis who presents with concerns of abnormal labs.  Patient has been having ongoing issues with constipation despite doing MiraLAX and drinking prune juice.  She presented to her primary physician with the symptoms and also had labs drawn.  Her labs returned today with significant hyponatremia of 122, potassium of around 6.2 and elevated creatinine of 1.3 she was advised to present to the ED.  Patient reports that recently she has been having TMJ pain issues and has seen an ENT and was prescribed Bactrim for possible sinus issue and placed on a higher dose prednisone taper.  She was also told to do a soft diet so for the past 2 weeks she has only been able to drink soup and soft foods.  ED Course: She was afebrile and mildly intermittently hypertensive up to systolic 578I.  She has mild leukocytosis of 14.2 and thrombocytosis of 468.  Sodium of 121 and normal potassium 4.9.  Creatinine is also improved from prior outpatient lab down to 1.15.  Abdominal x-ray shows mild increased colonic stool burden.  Review of Systems:  Constitutional: No Weight Change, No Fever ENT/Mouth: No sore throat, No Rhinorrhea Eyes: No Eye Pain, No Vision Changes Cardiovascular: No Chest Pain, no SOB Respiratory: No Cough, No Sputum, No Wheezing, no Dyspnea  Gastrointestinal: No Nausea, No Vomiting, No Diarrhea, + Constipation, No Pain Genitourinary: no Urinary Incontinence, No Urgency, No Flank Pain Musculoskeletal: No Arthralgias, No Myalgias Skin: No Skin Lesions, No  Pruritus, Neuro: no Weakness, No Numbness,  No Loss of Consciousness, No Syncope Psych: No Anxiety/Panic, No Depression, no decrease appetite Heme/Lymph: No Bruising, No Bleeding  Past Medical History:  Diagnosis Date  . Arthritis   . Carpal tunnel syndrome   . 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 Hx of TIA (transient ischemic attack) and stroke   . Hypertension   . Hypothyroidism   . Osteoporosis   . Wears glasses   . Wears hearing aid     Past Surgical History:  Procedure Laterality Date  . APPENDECTOMY    . CARPAL TUNNEL RELEASE Left 07/11/2013   Procedure: CARPAL TUNNEL RELEASE;  Surgeon: Cammie Sickle., MD;  Location: [facility];  Service: Orthopedics;  Laterality: Left;  . COLONOSCOPY    . SUBDURAL HEMATOMA EVACUATION VIA CRANIOTOMY  2007  . TONSILLECTOMY       reports that she has never smoked. She has never used smokeless tobacco. She reports current alcohol use. She reports that she does not use drugs. Social History  Allergies  Allergen Reactions  . Pollen Extract   . Levofloxacin Anxiety    Agitation , nocturnal    Family History  Problem Relation Age of Onset  . Cancer Mother 21       breast ca  . Cancer Maternal Aunt   . Cancer Brother        pancreatic ca  . Breast cancer Sister   . Brain cancer Brother        astrocytoma?  . Kidney cancer Sister  Prior to Admission medications   Medication Sig Start Date End Date Taking? Authorizing Provider  aspirin EC 81 MG tablet Take 81 mg by mouth every evening.   Yes [provider]  cholecalciferol (VITAMIN D) 1000 units tablet Take 1,000 Units by mouth daily.   Yes [provider]  gentamicin ointment (GARAMYCIN) 0.1 % Apply 1 application topically 3 (three) times daily. 11/27/20  Yes [provider]  levothyroxine (SYNTHROID) 100 MCG tablet TAKE ONE TABLET ONCE A DAY ON AN EMPTY STOMACH. Patient needs appointment to receive further refills.  06/05/20  Yes Crecencio Mc, MD  losartan (COZAAR) 100 MG tablet TAKE 1 TABLET ONCE DAILY. 10/25/19  Yes Crecencio Mc, MD  metoprolol succinate (TOPROL-XL) 50 MG 24 hr tablet TAKE 1 TABLET ONCE DAILY IN THE EVENING. 11/08/19  Yes Crecencio Mc, MD  omeprazole (PRILOSEC) 40 MG capsule Take 40 mg by mouth every evening. 10/31/20  Yes [provider]  predniSONE (DELTASONE) 10 MG tablet Take 10-60 mg by mouth See admin instructions. Prednisone Taper  Take 6 tablets on Day 1 Take 5 tablets on Day 2 Take 4 tablets on Day 3 Take 3 tablets on Day 4 Take 2 tablets on Day 5 Take 1 tablet on Day 6 tablets 11/27/20  Yes [provider]  sulfamethoxazole-trimethoprim (BACTRIM DS) 800-160 MG tablet Take 1 tablet by mouth 2 (two) times daily. 11/27/20  Yes [provider]  diphenhydrAMINE (BENADRYL) 25 MG tablet Take 12.5 mg by mouth at bedtime as needed for sleep or allergies.    [provider]  predniSONE (DELTASONE) 1 MG tablet Take 3 mg by mouth daily. 05/12/17   [provider]    Physical Exam: Vitals:   12/07/20 2300 12/07/20 2330 12/08/20 0004 12/08/20 0100  BP: (!) 181/68 134/73 (!) 130/48 (!) 139/51  Pulse: 63 65 65 62  Resp: 15 14 (!) 21 14  Temp:      TempSrc:      SpO2: 100% 99% 100% 97%  Weight:      Height:        Constitutional: NAD, calm, comfortable, pleasant elderly female laying at 20 degree incline in bed Vitals:   12/07/20 2300 12/07/20 2330 12/08/20 0004 12/08/20 0100  BP: (!) 181/68 134/73 (!) 130/48 (!) 139/51  Pulse: 63 65 65 62  Resp: 15 14 (!) 21 14  Temp:      TempSrc:      SpO2: 100% 99% 100% 97%  Weight:      Height:       Eyes: PERRL, lids and conjunctivae normal ENMT: Mucous membranes are moist.  Neck: normal, supple Respiratory: clear to auscultation bilaterally, no wheezing, no crackles. Normal respiratory effort. No accessory muscle use.  Cardiovascular: Regular rate and rhythm, no murmurs / rubs /  gallops. No extremity edema.  Abdomen: no tenderness, no masses palpated.  Bowel sounds positive.  Musculoskeletal: no clubbing / cyanosis. No joint deformity upper and lower extremities. Good ROM, no contractures. Normal muscle tone.  Skin: no rashes, lesions, ulcers. No induration Neurologic: CN 2-12 grossly intact. Sensation intact,  Strength 5/5 in all 4.  Psychiatric: Normal judgment and insight. Alert and oriented x 3. Normal mood.     Labs on Admission: I have personally reviewed following labs and imaging studies  CBC: Recent Labs  Lab 12/07/20 1925  WBC 14.2*  HGB 13.0  HCT 38.5  MCV 87.5  PLT 301*   Basic Metabolic Panel: Recent Labs  Lab  12/07/20 1925  NA 121*  K 4.9  CL 89*  CO2 19*  GLUCOSE 116*  BUN 30*  CREATININE 1.15*  CALCIUM 8.8*   GFR: Estimated Creatinine Clearance: 28 mL/min (A) (by C-G formula based on SCr of 1.15 mg/dL (H)). Liver Function Tests: No results for input(s): AST, ALT, ALKPHOS, BILITOT, PROT, ALBUMIN in the last 168 hours. No results for input(s): LIPASE, AMYLASE in the last 168 hours. No results for input(s): AMMONIA in the last 168 hours. Coagulation Profile: No results for input(s): INR, PROTIME in the last 168 hours. Cardiac Enzymes: No results for input(s): CKTOTAL, CKMB, CKMBINDEX, TROPONINI in the last 168 hours. BNP (last 3 results) No results for input(s): PROBNP in the last 8760 hours. HbA1C: No results for input(s): HGBA1C in the last 72 hours. CBG: No results for input(s): GLUCAP in the last 168 hours. Lipid Profile: No results for input(s): CHOL, HDL, LDLCALC, TRIG, CHOLHDL, LDLDIRECT in the last 72 hours. Thyroid Function Tests: No results for input(s): TSH, T4TOTAL, FREET4, T3FREE, THYROIDAB in the last 72 hours. Anemia Panel: No results for input(s): VITAMINB12, FOLATE, FERRITIN, TIBC, IRON, RETICCTPCT in the last 72 hours. Urine analysis:    Component Value Date/Time   COLORURINE YELLOW 12/07/2020 1925    APPEARANCEUR TURBID (A) 12/07/2020 1925   APPEARANCEUR Clear 05/01/2016 1209   LABSPEC 1.019 12/07/2020 1925   PHURINE 5.0 12/07/2020 1925   GLUCOSEU NEGATIVE 12/07/2020 1925   GLUCOSEU NEGATIVE 04/17/2016 1353   HGBUR NEGATIVE 12/07/2020 1925   BILIRUBINUR NEGATIVE 12/07/2020 1925   BILIRUBINUR Negative 05/01/2016 1209   KETONESUR NEGATIVE 12/07/2020 1925   PROTEINUR NEGATIVE 12/07/2020 1925   UROBILINOGEN 0.2 04/17/2016 1404   UROBILINOGEN 0.2 04/17/2016 1353   NITRITE NEGATIVE 12/07/2020 1925   LEUKOCYTESUR MODERATE (A) 12/07/2020 1925    Radiological Exams on Admission: DG Abd Portable 1 View  Result Date: 12/07/2020 CLINICAL DATA:  PCP called today to inform pt that sodium levels at 122, potassium at 6.9, and WBC at 15.9. Severe constipation recently. Denies any abdominal pain. EXAM: PORTABLE ABDOMEN - 1 VIEW COMPARISON:  12/06/2020 FINDINGS: There is no bowel dilation to suggest obstruction. Colonic stool appears mildly increased, similar to the prior radiographs. Soft tissues are unremarkable. No acute or significant skeletal abnormality. Lung bases are clear. IMPRESSION: 1. No acute findings. 2. Mild increased colonic stool burden similar to the previous day's study. Electronically Signed   By: Lajean Manes M.D.   On: 12/07/2020 21:07      Assessment/Plan  Hyponatremia Likely secondary to hypovolemia since patient has had decreased p.o. intake due to TMJ pain Give 1 L normal saline fluid and a repeat in the morning with BMP  AKI Patient had outpatient creatinine of 1.31 this is already improving down to 1.15 Follow with morning BMP following fluids Avoid nephrotoxic agent  Constipation Patient reports long history of constipation/diarrhea and takes MiraLAX as well as prune juice frequently.  She has plans to see GI outpatient. Patient declines a suppository at this time Continue with MiraLAX and add Colace daily  Hypertension Continue metoprolol Hold losartan due  to AKI  TMJ dysfunction/pain with history of RA Continue prednisone taper Patient also prescribed Bactrim by ENT although unclear treatment indication.  She is already completed 10 days so will not continue at this time as it could also exacerbate her hyponatremia.  RA Patient on chronic 3 mg of prednisone but currently on higher taper for TMJ pain prescribed by ENT outpatient  Hypothyroidism Continue levothyroxine  DVT prophylaxis:.Lovenox Code Status: DNR  Family Communication: Plan discussed with patient and daughter at bedside  disposition Plan: Home with observation Consults called:  Admission status: Observation  Level of care: Med-Surg  Status is: Observation  The patient remains OBS appropriate and will d/c before 2 midnights.  Dispo: The patient is from: Home              Anticipated d/c is to: Home              Anticipated d/c date is: 1 day              Patient currently is not medically stable to d/c.   Difficult to place patient No         Orene Desanctis DO [facility]   If 7PM-7AM, please contact night-coverage [email or web address]   12/08/2020, 1:30 AM

## 2020-12-08 NOTE — Evaluation (Signed)
Physical Therapy Evaluation Patient Details Name: [name] MRN: [id number] DOB: December 06, 1926 Today's Date: 12/08/2020   History of Present Illness  87 yo female admitted with hyponatremia.  Clinical Impression  On eval, pt was Min assist for mobility. She walked ~200 feet around the unit. Pt was very unsteady throughout session. She required assistance from therapist to prevent falling. High risk for falls currently. Pt denied dizziness. Will continue to follow and progress activity as tolerated.     Follow Up Recommendations Home health PT;Supervision for mobility/OOB    Equipment Recommendations   (continuing to assess)    Recommendations for Other Services       Precautions / Restrictions Precautions Precautions: Fall Restrictions Weight Bearing Restrictions: No      Mobility  Bed Mobility Overal bed mobility: Modified Independent                  Transfers Overall transfer level: Modified independent                  Ambulation/Gait Ambulation/Gait assistance: Min assist Gait Distance (Feet): 200 Feet Assistive device:  (hallway handrail/"cruising") Gait Pattern/deviations: Step-through pattern;Decreased stride length     General Gait Details: Very unsteady. LOB multiple times requiring assistance from therapist to prevent fall. Pt denied dizziness.  Stairs            Wheelchair Mobility    Modified Rankin (Stroke Patients Only)       Balance Overall balance assessment: Needs assistance           Standing balance-Leahy Scale: Poor                               Pertinent Vitals/Pain Pain Assessment: No/denies pain    Home Living Family/patient expects to be discharged to:: Private residence Living Arrangements: Alone   Type of Home: Independent living facility Home Access: Level entry     Home Layout: One level Home Equipment: None      Prior Function Level of Independence: Independent                Hand Dominance        Extremity/Trunk Assessment   Upper Extremity Assessment Upper Extremity Assessment: Overall WFL for tasks assessed    Lower Extremity Assessment Lower Extremity Assessment: Generalized weakness    Cervical / Trunk Assessment Cervical / Trunk Assessment: Normal  Communication   Communication: No difficulties  Cognition Arousal/Alertness: Awake/alert Behavior During Therapy: WFL for tasks assessed/performed Overall Cognitive Status: Within Functional Limits for tasks assessed                                        General Comments      Exercises     Assessment/Plan    PT Assessment Patient needs continued PT services  PT Problem List Decreased mobility;Decreased balance       PT Treatment Interventions Gait training;DME instruction;Therapeutic activities;Therapeutic exercise;Patient/family education;Balance training;Functional mobility training    PT Goals (Current goals can be found in the Care Plan section)  Acute Rehab PT Goals Patient Stated Goal: back to Ind Living PT Goal Formulation: With patient Time For Goal Achievement: 12/22/20 Potential to Achieve Goals: Good    Frequency Min 3X/week   Barriers to discharge        Co-evaluation  AM-PAC PT "6 Clicks" Mobility  Outcome Measure Help needed turning from your back to your side while in a flat bed without using bedrails?: None Help needed moving from lying on your back to sitting on the side of a flat bed without using bedrails?: None Help needed moving to and from a bed to a chair (including a wheelchair)?: A Little Help needed standing up from a chair using your arms (e.g., wheelchair or bedside chair)?: None Help needed to walk in hospital room?: A Little Help needed climbing 3-5 steps with a railing? : A Little 6 Click Score: 21    End of Session Equipment Utilized During Treatment: Gait belt Activity Tolerance: Patient tolerated  treatment well Patient left: in chair;with call bell/phone within reach;with chair alarm set   PT Visit Diagnosis: Unsteadiness on feet (R26.81)    Time: 9012-2241 PT Time Calculation (min) (ACUTE ONLY): 16 min   Charges:   PT Evaluation $PT Eval Low Complexity: [place], PT Acute Rehabilitation  Office: [phone] Pager: [phone]

## 2020-12-09 ENCOUNTER — Telehealth: Payer: Self-pay

## 2020-12-09 DIAGNOSIS — M810 Age-related osteoporosis without current pathological fracture: Secondary | ICD-10-CM | POA: Diagnosis present

## 2020-12-09 DIAGNOSIS — E039 Hypothyroidism, unspecified: Secondary | ICD-10-CM | POA: Diagnosis present

## 2020-12-09 DIAGNOSIS — Z66 Do not resuscitate: Secondary | ICD-10-CM | POA: Diagnosis present

## 2020-12-09 DIAGNOSIS — H9190 Unspecified hearing loss, unspecified ear: Secondary | ICD-10-CM | POA: Diagnosis present

## 2020-12-09 DIAGNOSIS — Z7982 Long term (current) use of aspirin: Secondary | ICD-10-CM | POA: Diagnosis not present

## 2020-12-09 DIAGNOSIS — D72829 Elevated white blood cell count, unspecified: Secondary | ICD-10-CM | POA: Diagnosis present

## 2020-12-09 DIAGNOSIS — Z20822 Contact with and (suspected) exposure to covid-19: Secondary | ICD-10-CM | POA: Diagnosis present

## 2020-12-09 DIAGNOSIS — N179 Acute kidney failure, unspecified: Secondary | ICD-10-CM | POA: Diagnosis present

## 2020-12-09 DIAGNOSIS — Z8673 Personal history of transient ischemic attack (TIA), and cerebral infarction without residual deficits: Secondary | ICD-10-CM | POA: Diagnosis not present

## 2020-12-09 DIAGNOSIS — M2669 Other specified disorders of temporomandibular joint: Secondary | ICD-10-CM | POA: Diagnosis present

## 2020-12-09 DIAGNOSIS — T380X5A Adverse effect of glucocorticoids and synthetic analogues, initial encounter: Secondary | ICD-10-CM | POA: Diagnosis present

## 2020-12-09 DIAGNOSIS — M069 Rheumatoid arthritis, unspecified: Secondary | ICD-10-CM | POA: Diagnosis present

## 2020-12-09 DIAGNOSIS — Z888 Allergy status to other drugs, medicaments and biological substances status: Secondary | ICD-10-CM | POA: Diagnosis not present

## 2020-12-09 DIAGNOSIS — I10 Essential (primary) hypertension: Secondary | ICD-10-CM | POA: Diagnosis present

## 2020-12-09 DIAGNOSIS — Z7952 Long term (current) use of systemic steroids: Secondary | ICD-10-CM | POA: Diagnosis not present

## 2020-12-09 DIAGNOSIS — Y929 Unspecified place or not applicable: Secondary | ICD-10-CM | POA: Diagnosis not present

## 2020-12-09 DIAGNOSIS — K5900 Constipation, unspecified: Secondary | ICD-10-CM | POA: Diagnosis present

## 2020-12-09 DIAGNOSIS — E875 Hyperkalemia: Secondary | ICD-10-CM | POA: Diagnosis present

## 2020-12-09 DIAGNOSIS — D75839 Thrombocytosis, unspecified: Secondary | ICD-10-CM | POA: Diagnosis present

## 2020-12-09 DIAGNOSIS — Z79890 Hormone replacement therapy (postmenopausal): Secondary | ICD-10-CM | POA: Diagnosis not present

## 2020-12-09 DIAGNOSIS — Z974 Presence of external hearing-aid: Secondary | ICD-10-CM | POA: Diagnosis not present

## 2020-12-09 DIAGNOSIS — E860 Dehydration: Secondary | ICD-10-CM | POA: Diagnosis present

## 2020-12-09 DIAGNOSIS — E861 Hypovolemia: Secondary | ICD-10-CM | POA: Diagnosis present

## 2020-12-09 DIAGNOSIS — E871 Hypo-osmolality and hyponatremia: Secondary | ICD-10-CM | POA: Diagnosis present

## 2020-12-09 DIAGNOSIS — M0600 Rheumatoid arthritis without rheumatoid factor, unspecified site: Secondary | ICD-10-CM | POA: Diagnosis not present

## 2020-12-09 DIAGNOSIS — Z79899 Other long term (current) drug therapy: Secondary | ICD-10-CM | POA: Diagnosis not present

## 2020-12-09 LAB — BASIC METABOLIC PANEL
Anion gap: 7 (ref 5–15)
Anion gap: 7 (ref 5–15)
BUN: 18 mg/dL (ref 8–23)
BUN: 18 mg/dL (ref 8–23)
CO2: 25 mmol/L (ref 22–32)
CO2: 25 mmol/L (ref 22–32)
Calcium: 8.2 mg/dL — ABNORMAL LOW (ref 8.9–10.3)
Calcium: 8 mg/dL — ABNORMAL LOW (ref 8.9–10.3)
Chloride: 96 mmol/L — ABNORMAL LOW (ref 98–111)
Chloride: 95 mmol/L — ABNORMAL LOW (ref 98–111)
Creatinine, Ser: 0.77 mg/dL (ref 0.44–1.00)
Creatinine, Ser: 0.75 mg/dL (ref 0.44–1.00)
GFR, Estimated: >60 mL/min (ref >60)
GFR, Estimated: >60 mL/min (ref >60)
Glucose, Bld: 95 mg/dL (ref 70–99)
Glucose, Bld: 100 mg/dL — ABNORMAL HIGH (ref 70–99)
Potassium: 4.3 mmol/L (ref 3.5–5.1)
Potassium: 3.8 mmol/L (ref 3.5–5.1)
Sodium: 128 mmol/L — ABNORMAL LOW (ref 135–145)
Sodium: 127 mmol/L — ABNORMAL LOW (ref 135–145)

## 2020-12-09 LAB — CBC
HCT: 37 % (ref 36.0–46.0)
Hemoglobin: 12.1 g/dL (ref 12.0–15.0)
MCH: 29.4 pg (ref 26.0–34.0)
MCHC: 32.7 g/dL (ref 30.0–36.0)
MCV: 89.8 fL (ref 80.0–100.0)
Platelets: 354 10*3/uL (ref 150–400)
RBC: 4.12 MIL/uL (ref 3.87–5.11)
RDW: 12.2 % (ref 11.5–15.5)
WBC: 11.5 10*3/uL — ABNORMAL HIGH (ref 4.0–10.5)
nRBC: 0 % (ref 0.0–0.2)

## 2020-12-09 LAB — URINE CULTURE: Culture: <10000 — AB

## 2020-12-09 MED ORDER — ENOXAPARIN SODIUM 40 MG/0.4ML ~~LOC~~ SOLN
40.0000 mg | SUBCUTANEOUS | Status: DC
  Administered 2020-12-09 – 2020-12-11 (×3): 40 mg via SUBCUTANEOUS
  Filled 2020-12-09 (×3): qty 0.4

## 2020-12-09 MED ORDER — ONDANSETRON HCL 4 MG/2ML IJ SOLN: 4.0000 mg | Freq: Four times a day (QID) | INTRAMUSCULAR | Status: DC | PRN

## 2020-12-09 NOTE — Progress Notes (Signed)
PROGRESS NOTE    [name]  VZD:[id number] DOB: 1927-08-04 DOA: 12/07/2020 PCP: Marda Stalker, PA-C   Chief Complaint  Patient presents with  . Weakness  . Abnormal Lab  Brief Narrative: 87 year old female with RA on chronic prednisone 3 mg, TIA, HTN, osteoporosis presented with abnormal labs low sodium from her PCP office. as per the report has been having ongoing issues with constipation despite doing MiraLAX and drinking prune juice. She presented to her primary physician with the symptoms and also had labs drawn. Her labs returned with significant hyponatremia of 122, potassium of around 6.2 and elevated creatinine of 1.3 she was advised to present to the ED. patient has been having TMJ pain seen by ENT and was placed on Bactrim for possible sinusitis even higher dose of prednisone taper just about to finish and soft diet x2 weeks and has only been able to drink soup and soft foods. In the ED had leukocytosis, low sodium 121 and was admitted.  Her UA was abnormal but without urinary symptoms.  She was given normal saline.  X-ray showed increased colonic stool burden Patient was admitted given trial of normal saline sodium improved some indicating degree of dehydration. On further hydration sodium was down to 121 so placed on salt tablets and start IV fluids and sodium has improved to 124 Mental status improved alert awake did work with PT  Subjective: AAOX3, hard of hearing, feeling much better today, mild nausea this am.Daughter at bedside  Assessment & Plan:  Hyponatremia, suspect multifactorial due to hypovolemia dehydration, also has been taking Bactrim. Continue salt tablets, fluid restriction 1200 ml, reepeat bmp still low a 127- Discussed w/ Dr Posey Pronto, will keep one more day with bmp in am. Daughter/patient is agreeable. Recent Labs  Lab 12/07/20 1925 12/08/20 0522 12/08/20 1624 12/09/20 0521 12/09/20 1222  NA 121* 124* 121* 128* 127*    AKI likely with poor  oral intake dehydration/Bactrim: Creatinine improved to 0.7 baseline. And BUN improved. Off ivf. Hold losartan. Recent Labs  Lab 12/07/20 1925 12/08/20 0522 12/08/20 1624 12/09/20 0521 12/09/20 1222  BUN 30* 24* 23 18 18   CREATININE 1.15* 1.01* 0.90 0.77 0.75   Leukocytosis likely from her steroid use.  She has completed Bactrim.  WBC count is improving with 1.5K.   Abnormal UA no urinary symptoms.  Urine culture less than 10,000 colonies.  Essential hypertension: Blood pressure stable continue metoprolol resume losartan on discharge  Constipation:  Having good bowel movement continue MiraLAX daily can hold for loose stool frequent BM   RA on chronic prednisone  back on 3 mg prednisone continue the same. finished steroid taper.  Acquired hypothyroidism: Continue Synthroid  TMJ dysfunction/pain with history of RA: She finished her prednisone taper and completing her Bactrim.    Nutrition: Diet Order            Diet Heart Room service appropriate? Yes; Fluid consistency: Thin; Fluid restriction: 1200 mL Fluid  Diet effective now               Pt's Body mass index is 24.66 kg/m. Patient has the following signs/symptoms consistent with PCM (fat loss, cellulitis, Celestin, cachexia)  DVT prophylaxis: enoxaparin (LOVENOX) injection 40 mg Start: 12/09/20 1000 Code Status:   Code Status: DNR  Family Communication: plan of care discussed with patient at bedside.  Status is: admitted as Observation  Remains hospitalized for ongoing management of low sodium and will need at-least 2 midnights stay Dispo: The [facility] is from: [facility]              Anticipated d/c is to: [facility]              Anticipated d/c date is: 1 day              Patient currently is not medically stable to d/c.   Difficult to place patient No  Consultants:see note  Procedures:see note  Unresulted Labs (From admission, onward)          Start     Ordered   12/10/20 6283  Basic metabolic  panel  Daily,   R     Question:  Specimen collection method  Answer:  Lab=Lab collect   12/09/20 1319   12/09/20 1319  TSH  Add-on,   AD       Question:  Specimen collection method  Answer:  Lab=Lab collect   12/09/20 1319   12/09/20 1517  Basic metabolic panel  Daily,   R     Question:  Specimen collection method  Answer:  Lab=Lab collect   12/08/20 0829   12/09/20 0500  CBC  Daily,   R     Question:  Specimen collection method  Answer:  Lab=Lab collect   12/08/20 0829          Culture/Microbiology    Component Value Date/Time   SDES  12/07/2020 1925    URINE, RANDOM Performed at [facility], [place] [address], [place], [place] [address]    SPECREQUEST  12/07/2020 1925    NONE Performed at [facility], [place] [address] [place], [place] [address]    CULT (A) 12/07/2020 1925    <10,000 COLONIES/mL INSIGNIFICANT GROWTH Performed at [facility] [address], [place], [place] [address]    REPTSTATUS 12/09/2020 FINAL 12/07/2020 1925    Other culture-see note  Medications: Scheduled Meds: . aspirin EC  81 mg Oral QPM  . cholecalciferol  1,000 Units Oral Daily  . docusate sodium  100 mg Oral Daily  . enoxaparin (LOVENOX) injection  40 mg Subcutaneous Q24H  . levothyroxine  100 mcg Oral Q0600  . metoprolol succinate  50 mg Oral Daily  . pantoprazole  40 mg Oral Daily  . polyethylene glycol  17 g Oral Daily  . predniSONE  3 mg Oral Q breakfast  . sodium chloride  1 g Oral TID WC   Continuous Infusions:  Antimicrobials: Anti-infectives (From admission, onward)   None     Objective: Vitals: Today's Vitals   12/08/20 2114 12/09/20 0536 12/09/20 0900 12/09/20 0926  BP: (!) 161/61 (!) 143/63 (!) 113/102   Pulse: 64 (!) 59    Resp:  18    Temp: 97.6 F (36.4 C) (!) 97.5 F (36.4 C)    TempSrc: Oral Oral    SpO2: 99% 99%    Weight:      Height:      PainSc: 0-No pain   0-No pain    Intake/Output Summary (Last 24  hours) at 12/09/2020 1331 Last data filed at 12/09/2020 1011 Gross per 24 hour  Intake 1313.23 ml  Output 1700 ml  Net -386.77 ml   Filed Weights   12/07/20 1837  Weight: 62.1 kg   Weight change:   Intake/Output from previous day: 02/13 0701 - 02/14 0700 In: 1193.2 [P.O.:720; I.V.:473.2] Out: 1400 [Urine:1400] Intake/Output this shift: Total I/O In: 240 [P.O.:240] Out: 300 [Urine:300] Filed Weights   12/07/20 1837  Weight: 62.1  kg    Examination: General exam: AAOx3 ,NAD, weak appearing. HEENT:Oral mucosa moist, Ear/Nose WNL grossly,dentition normal. Respiratory system: bilaterally clear,no wheezing or crackles,no use of accessory muscle, non tender. Cardiovascular system: S1 & S2 +, regular, No JVD. Gastrointestinal system: Abdomen soft, NT,ND, BS+. Nervous System:Alert, awake, moving extremities and grossly nonfocal Extremities: No edema, distal peripheral pulses palpable.  Skin: No rashes,no icterus. MSK: Normal muscle bulk,tone, power   Data Reviewed: I have personally reviewed following labs and imaging studies CBC: Recent Labs  Lab 12/07/20 1925 12/08/20 0522 12/09/20 0521  WBC 14.2* 12.8* 11.5*  HGB 13.0 11.8* 12.1  HCT 38.5 35.6* 37.0  MCV 87.5 88.8 89.8  PLT 468* 356 983   Basic Metabolic Panel: Recent Labs  Lab 12/07/20 1925 12/08/20 0522 12/08/20 1624 12/09/20 0521 12/09/20 1222  NA 121* 124* 121* 128* 127*  K 4.9 4.8 4.9 4.3 3.8  CL 89* 94* 91* 96* 95*  CO2 19* 24 23 25 25   GLUCOSE 116* 97 169* 95 100*  BUN 30* 24* 23 18 18   CREATININE 1.15* 1.01* 0.90 0.77 0.75  CALCIUM 8.8* 7.9* 8.0* 8.2* 8.0*   GFR: Estimated Creatinine Clearance: 40.2 mL/min (by C-G formula based on SCr of 0.75 mg/dL). Liver Function Tests: No results for input(s): AST, ALT, ALKPHOS, BILITOT, PROT, ALBUMIN in the last 168 hours. No results for input(s): LIPASE, AMYLASE in the last 168 hours. No results for input(s): AMMONIA in the last 168 hours. Coagulation  Profile: No results for input(s): INR, PROTIME in the last 168 hours. Cardiac Enzymes: No results for input(s): CKTOTAL, CKMB, CKMBINDEX, TROPONINI in the last 168 hours. BNP (last 3 results) No results for input(s): PROBNP in the last 8760 hours. HbA1C: No results for input(s): HGBA1C in the last 72 hours. CBG: No results for input(s): GLUCAP in the last 168 hours. Lipid Profile: No results for input(s): CHOL, HDL, LDLCALC, TRIG, CHOLHDL, LDLDIRECT in the last 72 hours. Thyroid Function Tests: No results for input(s): TSH, T4TOTAL, FREET4, T3FREE, THYROIDAB in the last 72 hours. Anemia Panel: No results for input(s): VITAMINB12, FOLATE, FERRITIN, TIBC, IRON, RETICCTPCT in the last 72 hours. Sepsis Labs: No results for input(s): PROCALCITON, LATICACIDVEN in the last 168 hours.  Recent Results (from the past 240 hour(s))  Culture, Urine     Status: Abnormal   Collection Time: 12/07/20  7:25 PM   Specimen: Urine, Random  Result Value Ref Range Status   Specimen Description   Final    URINE, RANDOM Performed at [facility] [address], [place], [place] [address]    Special Requests   Final    NONE Performed at [facility], [place] [address] [place], [place] [address]    Culture (A)  Final    <10,000 COLONIES/mL INSIGNIFICANT GROWTH Performed at [facility] [address], [place], [place] [address]    Report Status 12/09/2020 FINAL  Final  Resp Panel by RT-PCR (Flu A&B, Covid) Nasopharyngeal Swab     Status: None   Collection Time: 12/07/20 11:13 PM   Specimen: Nasopharyngeal Swab; Nasopharyngeal(NP) swabs in vial transport medium  Result Value Ref Range Status   SARS Coronavirus 2 by RT PCR NEGATIVE NEGATIVE Final    Comment: (NOTE) SARS-CoV-2 target nucleic acids are NOT DETECTED.  The SARS-CoV-2 RNA is generally detectable in upper respiratory specimens during the acute phase of infection. The lowest concentration of  SARS-CoV-2 viral copies this assay can detect is 138 copies/mL. A negative result  does not preclude SARS-Cov-2 infection and should not be used as the sole basis for treatment or other patient management decisions. A negative result may occur with  improper specimen collection/handling, submission of specimen other than nasopharyngeal swab, presence of viral mutation(s) within the areas targeted by this assay, and inadequate number of viral copies(<138 copies/mL). A negative result must be combined with clinical observations, patient history, and epidemiological information. The expected result is Negative.  Fact Sheet for Patients:  EntrepreneurPulse.com.au  Fact Sheet for Healthcare Providers:  IncredibleEmployment.be  This test is no t yet approved or cleared by the [place] FDA and  has been authorized for detection and/or diagnosis of SARS-CoV-2 by FDA under an Emergency Use Authorization (EUA). This EUA will remain  in effect (meaning this test can be used) for the duration of the COVID-19 declaration under Section 564(b)(1) of the Act, 21 U.S.C.section 360bbb-3(b)(1), unless the authorization is terminated  or revoked sooner.       Influenza A by PCR NEGATIVE NEGATIVE Final   Influenza B by PCR NEGATIVE NEGATIVE Final    Comment: (NOTE) The Xpert Xpress SARS-CoV-2/FLU/RSV plus assay is intended as an aid in the diagnosis of influenza from Nasopharyngeal swab specimens and should not be used as a sole basis for treatment. Nasal washings and aspirates are unacceptable for Xpert Xpress SARS-CoV-2/FLU/RSV testing.  Fact Sheet for Patients: EntrepreneurPulse.com.au  Fact Sheet for Healthcare Providers: IncredibleEmployment.be  This test is not yet approved or cleared by the [place] FDA and has been authorized for detection and/or diagnosis of SARS-CoV-2 by FDA under an Emergency Use  Authorization (EUA). This EUA will remain in effect (meaning this test can be used) for the duration of the COVID-19 declaration under Section 564(b)(1) of the Act, 21 U.S.C. section 360bbb-3(b)(1), unless the authorization is terminated or revoked.  Performed at [facility], [place] [address], [place],  [address]      Radiology Studies: DG Abd Portable 1 View  Result Date: 12/07/2020 CLINICAL DATA:  PCP called today to inform pt that sodium levels at 122, potassium at 6.9, and WBC at 15.9. Severe constipation recently. Denies any abdominal pain. EXAM: PORTABLE ABDOMEN - 1 VIEW COMPARISON:  12/06/2020 FINDINGS: There is no bowel dilation to suggest obstruction. Colonic stool appears mildly increased, similar to the prior radiographs. Soft tissues are unremarkable. No acute or significant skeletal abnormality. Lung bases are clear. IMPRESSION: 1. No acute findings. 2. Mild increased colonic stool burden similar to the previous day's study. Electronically Signed   By: Lajean Manes M.D.   On: 12/07/2020 21:07     LOS: 0 days   Antonieta Pert, MD [facility]  12/09/2020, 1:31 PM

## 2020-12-09 NOTE — Progress Notes (Signed)
Physical Therapy Treatment Patient Details Name: [name] MRN: [id number] DOB: July 28, 1927 Today's Date: 12/09/2020    History of Present Illness 87 yo female admitted with hyponatremia.    PT Comments    Progressing with mobility.    Follow Up Recommendations  Home health PT;Supervision for mobility/OOB     Equipment Recommendations   (continuing to assess)    Recommendations for Other Services       Precautions / Restrictions Precautions Precautions: Fall Restrictions Weight Bearing Restrictions: No    Mobility  Bed Mobility Overal bed mobility: Modified Independent                  Transfers Overall transfer level: Modified independent                  Ambulation/Gait Ambulation/Gait assistance: Min guard;Min assist Gait Distance (Feet): 400 Feet   Gait Pattern/deviations: Step-through pattern;Decreased stride length     General Gait Details: Intermittently unsteady. Improved stability compared to yesterday.   Stairs             Wheelchair Mobility    Modified Rankin (Stroke Patients Only)       Balance Overall balance assessment: Needs assistance Performed side stepping,backwards walking, 180 degree turns- int A to steady           Standing balance-Leahy Scale: Fair                              Cognition Arousal/Alertness: Awake/alert Behavior During Therapy: WFL for tasks assessed/performed Overall Cognitive Status: Within Functional Limits for tasks assessed                                        Exercises      General Comments        Pertinent Vitals/Pain Pain Assessment: No/denies pain    Home Living                      Prior Function            PT Goals (current goals can now be found in the care plan section) Progress towards PT goals: Progressing toward goals    Frequency    Min 3X/week      PT Plan Current plan remains appropriate     Co-evaluation              AM-PAC PT "6 Clicks" Mobility   Outcome Measure  Help needed turning from your back to your side while in a flat bed without using bedrails?: None Help needed moving from lying on your back to sitting on the side of a flat bed without using bedrails?: None Help needed moving to and from a bed to a chair (including a wheelchair)?: None Help needed standing up from a chair using your arms (e.g., wheelchair or bedside chair)?: None Help needed to walk in hospital room?: A Little Help needed climbing 3-5 steps with a railing? : A Little 6 Click Score: 22    End of Session Equipment Utilized During Treatment: Gait belt Activity Tolerance: Patient tolerated treatment well Patient left: in bed;with call bell/phone within reach;with family/visitor present   PT Visit Diagnosis: Unsteadiness on feet (R26.81)     Time: 4098-1191 PT Time Calculation (min) (ACUTE ONLY): 11 min  Charges:  $Gait Training: 8-22  mins              Doreatha Massed, PT Acute Rehabilitation  Office: [phone] Pager: [phone]

## 2020-12-09 NOTE — Evaluation (Signed)
Occupational Therapy Evaluation Patient Details Name: [name] MRN: [id number] DOB: 11-14-1926 Today's Date: 12/09/2020    History of Present Illness 87 yo female admitted with hyponatremia.   Clinical Impression   Patient evaluated by Occupational Therapy with no further acute OT needs identified. All education has been completed and the patient has no further questions.  See below for any follow-up Occupational Therapy or equipment needs. OT is signing off. Thank you for this referral.     Follow Up Recommendations  No OT follow up    Equipment Recommendations  Tub/shower seat (Long bath brush or sponge)    Recommendations for Other Services       Precautions / Restrictions Precautions Precautions: Fall Restrictions Weight Bearing Restrictions: No      Mobility Bed Mobility Overal bed mobility: Modified Independent                  Transfers Overall transfer level: Modified independent               General transfer comment: Per pt request, pt ambulated in hallway 200' with 3 episodes of scissoring with Min guard assist to correct. Pt was chatting during ambulation, and cued to focus on walking only with improved balance noted. Pt educated not to allow distractions when ambulating at her ILF, and to focus only on walking to avoid losses of balance.    Balance             Standing balance-Leahy Scale: Fair Standing balance comment: Good static standing balance. Fair dynamic with episodes of poor when distrtacted during ambulation. No AD used for gait.                           ADL either performed or assessed with clinical judgement   ADL Overall ADL's : At baseline                                       General ADL Comments: Pt able to demonstrate donning and doffing socks at bedside, sink grooming, toilet hygiene (simulated at bedside) at her baseline.     Vision Baseline Vision/History: Wears glasses Wears  Glasses: At all times Patient Visual Report: No change from baseline       Perception     Praxis      Pertinent Vitals/Pain Pain Assessment: No/denies pain     Hand Dominance Right   Extremity/Trunk Assessment Upper Extremity Assessment Upper Extremity Assessment: Overall WFL for tasks assessed   Lower Extremity Assessment Lower Extremity Assessment: Defer to PT evaluation   Cervical / Trunk Assessment Cervical / Trunk Assessment: Normal   Communication Communication Communication: No difficulties   Cognition Arousal/Alertness: Awake/alert Behavior During Therapy: WFL for tasks assessed/performed Overall Cognitive Status: Within Functional Limits for tasks assessed                                     General Comments  Pt completed 30 sec sit to stand test, scoring 14 total, but counting 9 due to a few incomplete stands and uncontrolled sits. Pt eduucated to work on slow, controlled descents when preparing to sit to avoid future injury.    Exercises     Shoulder Instructions      Home Living Family/patient expects to be  discharged to:: Private residence Living Arrangements: Alone Available Help at Discharge: Family;Available PRN/intermittently Type of Home: Independent living facility Home Access: Level entry     Home Layout: One level     Bathroom Shower/Tub: Occupational psychologist: Handicapped height     Home Equipment: Grab bars - tub/shower;Grab bars - toilet   Additional Comments: Hurry-cane      Prior Functioning/Environment Level of Independence: Independent        Comments: Pt has a housekeeping service but does not use as she prefers to perform her own housekeeping. Pt performs all ADLs, IADLs and medication management independently. Pt goes to the exercise classes at her ILF and stays very active. Pt denies any falls in the last 12 months.        OT Problem List: Impaired balance (sitting and/or standing)       OT Treatment/Interventions:      OT Goals(Current goals can be found in the care plan section) Acute Rehab OT Goals Patient Stated Goal: Get back to ILF and stay active. OT Goal Formulation: With patient/family Time For Goal Achievement: 12/09/20 Potential to Achieve Goals: Good ADL Goals Additional ADL Goal #1: Pt will identify at least 3 fall prevention strategies to use at home in order to avoid injury and rehospitalization.  OT Frequency:     Barriers to D/C:            Co-evaluation              AM-PAC OT "6 Clicks" Daily Activity     Outcome Measure Help from another person eating meals?: None Help from another person taking care of personal grooming?: None Help from another person toileting, which includes using toliet, bedpan, or urinal?: None Help from another person bathing (including washing, rinsing, drying)?: A Little Help from another person to put on and taking off regular upper body clothing?: None Help from another person to put on and taking off regular lower body clothing?: None 6 Click Score: 23   End of Session Equipment Utilized During Treatment: Gait belt  Activity Tolerance: Patient tolerated treatment well Patient left: in chair;Other (comment);with family/visitor present;with chair alarm set;with call bell/phone within reach (MD and daughter in room)  OT Visit Diagnosis: Unsteadiness on feet (R26.81)                Time: 3009-2330 OT Time Calculation (min): 27 min Charges:  OT General Charges $OT Visit: 1 Visit OT Evaluation $OT Eval Low Complexity: 1 Low OT Treatments $Self Care/Home Management : 8-22 mins  Anderson Malta, OT Acute Rehab Services Office: [phone] 12/09/2020  Julien Girt 12/09/2020, 10:00 AM

## 2020-12-09 NOTE — Telephone Encounter (Signed)
[facility] sent [place] a medical release form for all office visit notes, labs and procedures. Faxed this to them per request

## 2020-12-10 DIAGNOSIS — E871 Hypo-osmolality and hyponatremia: Secondary | ICD-10-CM | POA: Diagnosis not present

## 2020-12-10 LAB — CBC
HCT: 35.9 % — ABNORMAL LOW (ref 36.0–46.0)
Hemoglobin: 11.9 g/dL — ABNORMAL LOW (ref 12.0–15.0)
MCH: 29.2 pg (ref 26.0–34.0)
MCHC: 33.1 g/dL (ref 30.0–36.0)
MCV: 88 fL (ref 80.0–100.0)
Platelets: 331 10*3/uL (ref 150–400)
RBC: 4.08 MIL/uL (ref 3.87–5.11)
RDW: 12.3 % (ref 11.5–15.5)
WBC: 12.6 10*3/uL — ABNORMAL HIGH (ref 4.0–10.5)
nRBC: 0 % (ref 0.0–0.2)

## 2020-12-10 LAB — BASIC METABOLIC PANEL
Anion gap: 8 (ref 5–15)
BUN: 13 mg/dL (ref 8–23)
CO2: 22 mmol/L (ref 22–32)
Calcium: 8.1 mg/dL — ABNORMAL LOW (ref 8.9–10.3)
Chloride: 97 mmol/L — ABNORMAL LOW (ref 98–111)
Creatinine, Ser: 0.66 mg/dL (ref 0.44–1.00)
GFR, Estimated: >60 mL/min (ref >60)
Glucose, Bld: 99 mg/dL (ref 70–99)
Potassium: 4.1 mmol/L (ref 3.5–5.1)
Sodium: 127 mmol/L — ABNORMAL LOW (ref 135–145)

## 2020-12-10 LAB — TSH: TSH: 1.783 u[IU]/mL (ref 0.350–4.500)

## 2020-12-10 MED ORDER — DIPHENHYDRAMINE HCL 25 MG PO CAPS
25.0000 mg | ORAL_CAPSULE | Freq: Every evening | ORAL | Status: DC | PRN
  Administered 2020-12-10: 25 mg via ORAL
  Filled 2020-12-10: qty 1

## 2020-12-10 MED ORDER — FUROSEMIDE 20 MG PO TABS
10.0000 mg | ORAL_TABLET | Freq: Every day | ORAL | Status: DC
  Administered 2020-12-10 – 2020-12-11 (×2): 10 mg via ORAL
  Filled 2020-12-10 (×2): qty 1

## 2020-12-10 NOTE — Progress Notes (Signed)
PT Cancellation Note  Patient Details Name: [name] MRN: [id number] DOB: 1927/09/17   Cancelled Treatment:    Reason Eval/Treat Not Completed: Patient declined, no reason specified Pt requesting to rest this morning stating she didn't sleep well.  Pt sleeping this afternoon upon return.  Will check back as schedule permits.   Waylyn Tenbrink,KATHrine E 12/10/2020, 1:45 PM Arlyce Dice, DPT Acute Rehabilitation Services Pager: [phone] Office: [phone]

## 2020-12-10 NOTE — TOC Initial Note (Signed)
Transition of Care [facility]) - Initial/Assessment Note    Patient Details  Name: [name] MRN: [id number] Date of Birth: Jan 31, 1927  Transition of Care [facility]) CM/SW Contact:    Lia Hopping, [place] Phone Number: 12/10/2020, 1:12 PM  Clinical Narrative: Patient admitted for Hyponatremia.   JP:ETKK/ dme needs.               CSW met with the patient and her daughter at bedside to discuss disposition. Patient lives in the independent living at [facility]. Patient agreeable to recommendation for Home Health physical therapy follow up. The facility is active with [facility] for [facility] Health services. CSW made a referral to liaison [place]. Home Health PT orders to be faxed to [phone].  Shower chair was ordered through [facility] however patient received call from [facility] stating they were out of stock. Daughter reports she will purchase a shower chair.  TOC staff will continue to follow this patient.   Expected Discharge Plan: [place] Barriers to Discharge: No Barriers Identified   Patient Goals and CMS Choice        Expected Discharge Plan and Services Expected Discharge Plan: [place] In-house Referral: Clinical Social Work Discharge Planning Services: CM Consult Post Acute Care Choice: Durable Medical Equipment Living arrangements for the past 2 months: [place]                 DME Arranged: Shower stool   Date DME Agency Contacted: 12/09/20 Time DME Agency Contacted: 1119 Representative spoke with at DME Agency: Freda Munro [place]: PT,OT [place] Agency:  Secondary school teacher) Date [place]: 12/10/20 Time [place]: [place] Representative spoke with at [place]: [place] Arrangements/Services Living arrangements for the past 2 months: Materials engineer Lives with:: Self Patient language and need for interpreter reviewed:: No Do you feel safe going back to the place where  you live?: Yes      Need for Family Participation in Patient Care: Yes (Comment) Care giver support system in place?: Yes (comment) Current home services: DME Criminal Activity/Legal Involvement Pertinent to Current Situation/Hospitalization: No - Comment as needed  Activities of Daily Living Home Assistive Devices/Equipment: None ADL Screening (condition at time of admission) Patient's cognitive ability adequate to safely complete daily activities?: Yes Is the patient deaf or have difficulty hearing?: Yes Does the patient have difficulty seeing, even when wearing glasses/contacts?: No Does the patient have difficulty concentrating, remembering, or making decisions?: No Patient able to express need for assistance with ADLs?: Yes Does the patient have difficulty dressing or bathing?: Yes Independently performs ADLs?: Yes (appropriate for developmental age) Does the patient have difficulty walking or climbing stairs?: No Weakness of Legs: None Weakness of Arms/Hands: None  Permission Sought/Granted Permission sought to share information with : Family Supports Permission granted to share information with : Yes, Verbal Permission Granted  Share Information with NAME: Steelman,Margaret  Permission granted to share info w AGENCY: [facility]  Permission granted to share info w Relationship: Daughter  Permission granted to share info w Contact Information: [phone]  Emotional Assessment Appearance:: Appears stated age Attitude/Demeanor/Rapport: Engaged Affect (typically observed): Accepting Orientation: : Oriented to Self,Oriented to Place,Oriented to  Time,Oriented to Situation Alcohol / Substance Use: Not Applicable Psych Involvement: No (comment)  Admission diagnosis:  Hyponatremia [E87.1] Patient Active Problem List   Diagnosis Date Noted  . Hyponatremia 12/08/2020  . AKI (acute kidney injury) ([place]) 12/08/2020  . Forearm laceration,  left, initial encounter 03/08/2019  .  Counseling regarding end of life decision making 11/27/2018  . Osteoporosis, post-menopausal 08/04/2018  . Community acquired pneumonia 04/26/2018  . Acquired hypothyroidism 11/30/2017  . Seronegative rheumatoid arthritis ([place]) 11/30/2017  . Constipation 04/14/2017  . Left anterior knee pain 11/03/2016  . TIA (transient ischemic attack) 04/15/2016  . Family history of breast cancer in first degree relative 03/10/2016  . Osteoarthritis, shoulder 11/30/2015  . Essential hypertension 11/30/2015  . Vitamin D deficiency 11/30/2015  . 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 Age-related cognitive decline 09/09/2014  . History of polymyalgia rheumatica 09/09/2014  . Long-term use of high-risk medication 09/09/2014   PCP:  Marda Stalker, PA-C Pharmacy:   [place], [place] [address] Phone: [phone] Fax: [phone]  [place], [place] [address] Phone: [phone] Fax: [phone]     Social Determinants of Health (SDOH) Interventions    Readmission Risk Interventions No flowsheet data found.

## 2020-12-10 NOTE — Progress Notes (Signed)
PROGRESS NOTE    [name]  DGL:[id number] DOB: May 14, 1927 DOA: 12/07/2020 PCP: Marda Stalker, PA-C   Chief Complaint  Patient presents with  . Weakness  . Abnormal Lab  Brief Narrative: 87 year old female with RA on chronic prednisone 3 mg, TIA, HTN, osteoporosis presented with abnormal labs low sodium from her PCP office. as per the report has been having ongoing issues with constipation despite doing MiraLAX and drinking prune juice. She presented to her primary physician with the symptoms and also had labs drawn. Her labs returned with significant hyponatremia of 122, potassium of around 6.2 and elevated creatinine of 1.3 she was advised to present to the ED. patient has been having TMJ pain seen by ENT and was placed on Bactrim for possible sinusitis even higher dose of prednisone taper just about to finish and soft diet x2 weeks and has only been able to drink soup and soft foods. In the ED had leukocytosis, low sodium 121 and was admitted.  Her UA was abnormal but without urinary symptoms.  She was given normal saline.  X-ray showed increased colonic stool burden  Subjective: Resting, aao3, did not sleep well. Daughter at bedside Sodium still at 127. Drinking okay.  Assessment & Plan:  Hyponatremia, suspect multifactorial due to hypovolemia/dehydration, as well as from meds-Bactrim,  [place] osmol high at 600- Sodium still low, discussed w/ Dr Posey Pronto nephro- on salt tablets, fluid restriction 1200 ml, adding lasix 10 mg daily. Nephrology will be seeing her today. Once sodium at least >130 we can d//c her home w/ outpatient follow up Recent Labs  Lab 12/08/20 0522 12/08/20 1624 12/09/20 0521 12/09/20 1222 12/10/20 0506  NA 124* 121* 128* 127* 127*    AKI likely with poor oral intake dehydration/Bactrim: Improved to 0.6.Holdin off losartan for now. Recent Labs  Lab 12/08/20 0522 12/08/20 1624 12/09/20 0521 12/09/20 1222 12/10/20 0506  BUN 24* 23 18 18 13    CREATININE 1.01* 0.90 0.77 0.75 0.66   Leukocytosis likely from her steroid use.At 2.6K. She afebrile. She has recently completed Bactrim.   Abnormal UA no urinary symptoms.  Urine culture less than 10,000 colonies.  Essential hypertension:  BP stable on metoprolol. Holding losartan for now.  Constipation:  Continue on MiraLAX daily\  RA on chronic prednisone  back on 3 mg prednisone continue the same. finished steroid taper.  Acquired hypothyroidism: On Synthroid  TMJ dysfunction/pain with history of RA: She finished her prednisone taper and completed her Bactrim. On soft diet but wants to have [place]-placed on regular diet, as tolerated  Nutrition: Diet Order            Diet regular Room service appropriate? Yes; Fluid consistency: Thin; Fluid restriction: 1200 mL Fluid  Diet effective now               Pt's Body mass index is 24.66 kg/m. Patient has the following signs/symptoms consistent with PCM (fat loss, cellulitis, Celestin, cachexia)  DVT prophylaxis: enoxaparin (LOVENOX) injection 40 mg Start: 12/09/20 1000 Code Status:   Code Status: DNR  Family Communication: plan of care discussed with patient at bedside.  Status is: Inpatient  Remains hospitalized for ongoing management of low sodium, further evaluation by nephrology. Dispo: The patient is from: [facility]              Anticipated d/c is to: [facility]              Anticipated d/c date is: 1 day once sodium stabilizes and  okay with nephrology              Patient currently is not medically stable to d/c.   Difficult to place patient No  Consultants:see note  Procedures:see note  Unresulted Labs (From admission, onward)          Start     Ordered   12/10/20 2426  Basic metabolic panel  Daily,   R     Question:  Specimen collection method  Answer:  Lab=Lab collect   12/09/20 1319   12/09/20 8341  Basic metabolic panel  Daily,   R     Question:  Specimen collection method  Answer:   Lab=Lab collect   12/08/20 0829   12/09/20 0500  CBC  Daily,   R     Question:  Specimen collection method  Answer:  Lab=Lab collect   12/08/20 0829          Culture/Microbiology    Component Value Date/Time   SDES  12/07/2020 1925    URINE, RANDOM Performed at [facility], [place] [address], [place], [place] [address]    SPECREQUEST  12/07/2020 1925    NONE Performed at [facility], [place] [address], [place], [place] [address]    CULT (A) 12/07/2020 1925    <10,000 COLONIES/mL INSIGNIFICANT GROWTH Performed at [facility] [address] [place], [place] [address]    REPTSTATUS 12/09/2020 FINAL 12/07/2020 1925    Other culture-see note  Medications: Scheduled Meds: . aspirin EC  81 mg Oral QPM  . cholecalciferol  1,000 Units Oral Daily  . docusate sodium  100 mg Oral Daily  . enoxaparin (LOVENOX) injection  40 mg Subcutaneous Q24H  . furosemide  10 mg Oral Daily  . levothyroxine  100 mcg Oral Q0600  . metoprolol succinate  50 mg Oral Daily  . pantoprazole  40 mg Oral Daily  . polyethylene glycol  17 g Oral Daily  . predniSONE  3 mg Oral Q breakfast  . sodium chloride  1 g Oral TID WC   Continuous Infusions:  Antimicrobials: Anti-infectives (From admission, onward)   None     Objective: Vitals: Today's Vitals   12/09/20 2216 12/10/20 0626 12/10/20 0821 12/10/20 1337  BP: (!) 137/46 111/61  (!) 128/59  Pulse: 65 72  70  Resp: 16 18  14   Temp: 98 F (36.7 C) 98.1 F (36.7 C)  97.8 F (36.6 C)  TempSrc: Oral Oral  Oral  SpO2: 98% 100%  98%  Weight:      Height:      PainSc:   3      Intake/Output Summary (Last 24 hours) at 12/10/2020 1354 Last data filed at 12/10/2020 1000 Gross per 24 hour  Intake 1220 ml  Output 1000 ml  Net 220 ml   Filed Weights   12/07/20 1837  Weight: 62.1 kg   Weight change:   Intake/Output from previous day: 02/14 0701 - 02/15 0700 In: 1280 [P.O.:1280] Out: 1300  [Urine:1300] Intake/Output this shift: Total I/O In: 300 [P.O.:300] Out: 0  Filed Weights   12/07/20 1837  Weight: 62.1 kg    Examination: General exam: AAOx3, elderly slightly hard of hearing, NAD, weak appearing. HEENT:Oral mucosa moist, Ear/Nose WNL grossly, dentition normal. Respiratory system: bilaterally diminished,no wheezing or crackles,no use of accessory muscle Cardiovascular system: S1 & S2 +, No JVD,. Gastrointestinal system: Abdomen soft, NT,ND, BS+ Nervous System:Alert, awake, moving extremities and grossly nonfocal Extremities: No edema, distal  peripheral pulses palpable.  Skin: No rashes,no icterus. MSK: Normal muscle bulk,tone, powerr   Data Reviewed: I have personally reviewed following labs and imaging studies CBC: Recent Labs  Lab 12/07/20 1925 12/08/20 0522 12/09/20 0521 12/10/20 0506  WBC 14.2* 12.8* 11.5* 12.6*  HGB 13.0 11.8* 12.1 11.9*  HCT 38.5 35.6* 37.0 35.9*  MCV 87.5 88.8 89.8 88.0  PLT 468* 356 354 703   Basic Metabolic Panel: Recent Labs  Lab 12/08/20 0522 12/08/20 1624 12/09/20 0521 12/09/20 1222 12/10/20 0506  NA 124* 121* 128* 127* 127*  K 4.8 4.9 4.3 3.8 4.1  CL 94* 91* 96* 95* 97*  CO2 24 23 25 25 22   GLUCOSE 97 169* 95 100* 99  BUN 24* 23 18 18 13   CREATININE 1.01* 0.90 0.77 0.75 0.66  CALCIUM 7.9* 8.0* 8.2* 8.0* 8.1*   GFR: Estimated Creatinine Clearance: 40.2 mL/min (by C-G formula based on SCr of 0.66 mg/dL). Liver Function Tests: No results for input(s): AST, ALT, ALKPHOS, BILITOT, PROT, ALBUMIN in the last 168 hours. No results for input(s): LIPASE, AMYLASE in the last 168 hours. No results for input(s): AMMONIA in the last 168 hours. Coagulation Profile: No results for input(s): INR, PROTIME in the last 168 hours. Cardiac Enzymes: No results for input(s): CKTOTAL, CKMB, CKMBINDEX, TROPONINI in the last 168 hours. BNP (last 3 results) No results for input(s): PROBNP in the last 8760 hours. HbA1C: No results  for input(s): HGBA1C in the last 72 hours. CBG: No results for input(s): GLUCAP in the last 168 hours. Lipid Profile: No results for input(s): CHOL, HDL, LDLCALC, TRIG, CHOLHDL, LDLDIRECT in the last 72 hours. Thyroid Function Tests: Recent Labs    12/10/20 0506  TSH 1.783   Anemia Panel: No results for input(s): VITAMINB12, FOLATE, FERRITIN, TIBC, IRON, RETICCTPCT in the last 72 hours. Sepsis Labs: No results for input(s): PROCALCITON, LATICACIDVEN in the last 168 hours.  Recent Results (from the past 240 hour(s))  Culture, Urine     Status: Abnormal   Collection Time: 12/07/20  7:25 PM   Specimen: Urine, Random  Result Value Ref Range Status   Specimen Description   Final    URINE, RANDOM Performed at [facility] [address], [place], [place] [address]    Special Requests   Final    NONE Performed at [facility], [place] [address], [place], [place] [address]    Culture (A)  Final    <10,000 COLONIES/mL INSIGNIFICANT GROWTH Performed at [facility] [address], [place], [place] [address]    Report Status 12/09/2020 FINAL  Final  Resp Panel by RT-PCR (Flu A&B, Covid) Nasopharyngeal Swab     Status: None   Collection Time: 12/07/20 11:13 PM   Specimen: Nasopharyngeal Swab; Nasopharyngeal(NP) swabs in vial transport medium  Result Value Ref Range Status   SARS Coronavirus 2 by RT PCR NEGATIVE NEGATIVE Final    Comment: (NOTE) SARS-CoV-2 target nucleic acids are NOT DETECTED.  The SARS-CoV-2 RNA is generally detectable in upper respiratory specimens during the acute phase of infection. The lowest concentration of SARS-CoV-2 viral copies this assay can detect is 138 copies/mL. A negative result does not preclude SARS-Cov-2 infection and should not be used as the sole basis for treatment or other patient management decisions. A negative result may occur with  improper specimen collection/handling, submission of specimen  other than nasopharyngeal swab, presence of viral mutation(s) within the areas targeted by this assay, and inadequate number of  viral copies(<138 copies/mL). A negative result must be combined with clinical observations, patient history, and epidemiological information. The expected result is Negative.  Fact Sheet for Patients:  EntrepreneurPulse.com.au  Fact Sheet for Healthcare Providers:  IncredibleEmployment.be  This test is no t yet approved or cleared by the [place] FDA and  has been authorized for detection and/or diagnosis of SARS-CoV-2 by FDA under an Emergency Use Authorization (EUA). This EUA will remain  in effect (meaning this test can be used) for the duration of the COVID-19 declaration under Section 564(b)(1) of the Act, 21 U.S.C.section 360bbb-3(b)(1), unless the authorization is terminated  or revoked sooner.       Influenza A by PCR NEGATIVE NEGATIVE Final   Influenza B by PCR NEGATIVE NEGATIVE Final    Comment: (NOTE) The Xpert Xpress SARS-CoV-2/FLU/RSV plus assay is intended as an aid in the diagnosis of influenza from Nasopharyngeal swab specimens and should not be used as a sole basis for treatment. Nasal washings and aspirates are unacceptable for Xpert Xpress SARS-CoV-2/FLU/RSV testing.  Fact Sheet for Patients: EntrepreneurPulse.com.au  Fact Sheet for Healthcare Providers: IncredibleEmployment.be  This test is not yet approved or cleared by the [place] FDA and has been authorized for detection and/or diagnosis of SARS-CoV-2 by FDA under an Emergency Use Authorization (EUA). This EUA will remain in effect (meaning this test can be used) for the duration of the COVID-19 declaration under Section 564(b)(1) of the Act, 21 U.S.C. section 360bbb-3(b)(1), unless the authorization is terminated or revoked.  Performed at [facility], [place] [address], [place], [place] [address]      Radiology Studies: No results found.   LOS: 1 day   Antonieta Pert, MD [facility]  12/10/2020, 1:54 PM

## 2020-12-10 NOTE — Consult Note (Signed)
Reason for Consult: Hyponatremia Referring Physician: Antonieta Pert MD [facility])  HPI:  87 year old Caucasian woman with past medical history significant for rheumatoid arthritis on chronic prednisone therapy, hypothyroidism, hypertension, history of TIA, osteoporosis and historically baseline sodium level of 137-139.  She was admitted to the hospital 2 days ago with acute kidney injury, hyponatremia and hyperkalemia in the setting of poor oral intake (due to TMJ dysfunction) and Bactrim/losartan use.  Her renal function has improved with intravenous fluids/supportive measures but she remains hyponatremic with a sodium of 127.  Her TSH was 1.78 and urine osmolality inappropriately high at 611.  She denies any nausea or vomiting and suffers chronic/intermittent constipation.  She denies any dysuria, urgency or frequency and does not have any flank pain, fever or chills.  Her losartan remains on hold.  She is on fluid restriction and salt tablets supplementation.  Past Medical History:  Diagnosis Date  . Arthritis   . Carpal tunnel syndrome   . Gait abnormality    has for 69yr-no use of cane  . Hx of TIA (transient ischemic attack) and stroke   . Hypertension   . Hypothyroidism   . Osteoporosis   . Wears glasses   . Wears hearing aid     Past Surgical History:  Procedure Laterality Date  . APPENDECTOMY    . CARPAL TUNNEL RELEASE Left 07/11/2013   Procedure: CARPAL TUNNEL RELEASE;  Surgeon: Cammie Sickle., MD;  Location: [facility];  Service: Orthopedics;  Laterality: Left;  . COLONOSCOPY    . SUBDURAL HEMATOMA EVACUATION VIA CRANIOTOMY  2007  . TONSILLECTOMY      Family History  Problem Relation Age of Onset  . Cancer Mother 11       breast ca  . Cancer Maternal Aunt   . Cancer Brother        pancreatic ca  . Breast cancer Sister   . Brain cancer Brother        astrocytoma?  . Kidney cancer Sister     Social History:  reports that she has never smoked. She has  never used smokeless tobacco. She reports current alcohol use. She reports that she does not use drugs.  Allergies:  Allergies  Allergen Reactions  . Pollen Extract   . Levofloxacin Anxiety    Agitation , nocturnal    Medications:  Scheduled: . aspirin EC  81 mg Oral QPM  . cholecalciferol  1,000 Units Oral Daily  . docusate sodium  100 mg Oral Daily  . enoxaparin (LOVENOX) injection  40 mg Subcutaneous Q24H  . furosemide  10 mg Oral Daily  . levothyroxine  100 mcg Oral Q0600  . metoprolol succinate  50 mg Oral Daily  . pantoprazole  40 mg Oral Daily  . polyethylene glycol  17 g Oral Daily  . predniSONE  3 mg Oral Q breakfast  . sodium chloride  1 g Oral TID WC    BMP Latest Ref Rng & Units 12/10/2020 12/09/2020 12/09/2020  Glucose 70 - 99 mg/dL 99 100(H) 95  BUN 8 - 23 mg/dL 13 18 18   Creatinine 0.44 - 1.00 mg/dL 0.66 0.75 0.77  Sodium 135 - 145 mmol/L 127(L) 127(L) 128(L)  Potassium 3.5 - 5.1 mmol/L 4.1 3.8 4.3  Chloride 98 - 111 mmol/L 97(L) 95(L) 96(L)  CO2 22 - 32 mmol/L 22 25 25   Calcium 8.9 - 10.3 mg/dL 8.1(L) 8.0(L) 8.2(L)   CBC Latest Ref Rng & Units 12/10/2020 12/09/2020 12/08/2020  WBC 4.0 -  10.5 K/uL 12.6(H) 11.5(H) 12.8(H)  Hemoglobin 12.0 - 15.0 g/dL 11.9(L) 12.1 11.8(L)  Hematocrit 36.0 - 46.0 % 35.9(L) 37.0 35.6(L)  Platelets 150 - 400 K/uL 331 354 356   Urinalysis    Component Value Date/Time   COLORURINE YELLOW 12/07/2020 1925   APPEARANCEUR TURBID (A) 12/07/2020 1925   APPEARANCEUR Clear 05/01/2016 1209   LABSPEC 1.019 12/07/2020 1925   PHURINE 5.0 12/07/2020 1925   GLUCOSEU NEGATIVE 12/07/2020 1925   GLUCOSEU NEGATIVE 04/17/2016 1353   HGBUR NEGATIVE 12/07/2020 1925   BILIRUBINUR NEGATIVE 12/07/2020 1925   BILIRUBINUR Negative 05/01/2016 1209   KETONESUR NEGATIVE 12/07/2020 1925   PROTEINUR NEGATIVE 12/07/2020 1925   UROBILINOGEN 0.2 04/17/2016 1404   UROBILINOGEN 0.2 04/17/2016 1353   NITRITE NEGATIVE 12/07/2020 1925   LEUKOCYTESUR MODERATE  (A) 12/07/2020 1925    No results found.  Review of Systems  Constitutional: Positive for appetite change. Negative for chills, fatigue and fever.  HENT: Positive for dental problem. Negative for ear discharge, ear pain, nosebleeds, sinus pressure and tinnitus.        TMJ dysfunction  Eyes: Negative for pain and redness.  Respiratory: Negative for chest tightness, shortness of breath and wheezing.   Cardiovascular: Negative for chest pain and leg swelling.  Gastrointestinal: Positive for constipation. Negative for abdominal pain, blood in stool, diarrhea, nausea and vomiting.  Genitourinary: Negative for flank pain, frequency, hematuria and urgency.  Musculoskeletal: Positive for arthralgias. Negative for myalgias and neck stiffness.  Skin: Negative for rash and wound.  Neurological: Negative for speech difficulty, weakness and light-headedness.   Blood pressure 111/61, pulse 72, temperature 98.1 F (36.7 C), temperature source Oral, resp. rate 18, height 5' 2.5" (1.588 m), weight 62.1 kg, SpO2 100 %. Physical Exam Vitals and nursing note reviewed.  Constitutional:      General: She is not in acute distress.    Appearance: Normal appearance. She is obese. She is not ill-appearing.  HENT:     Head: Normocephalic and atraumatic.     Right Ear: External ear normal.     Left Ear: External ear normal.     Nose: Nose normal.     Mouth/Throat:     Mouth: Mucous membranes are moist.     Pharynx: No posterior oropharyngeal erythema.  Eyes:     General: No scleral icterus.    Conjunctiva/sclera: Conjunctivae normal.     Pupils: Pupils are equal, round, and reactive to light.  Cardiovascular:     Rate and Rhythm: Normal rate and regular rhythm.     Heart sounds: Normal heart sounds. No murmur heard.   Pulmonary:     Effort: Pulmonary effort is normal.     Breath sounds: Normal breath sounds. No wheezing or rales.  Abdominal:     General: Abdomen is flat.     Palpations: Abdomen is  soft.     Tenderness: There is no abdominal tenderness. There is no guarding.  Musculoskeletal:     Cervical back: Normal range of motion and neck supple.     Right lower leg: No edema.     Left lower leg: No edema.  Skin:    General: Skin is warm and dry.     Coloration: Skin is not jaundiced.  Neurological:     General: No focal deficit present.     Mental Status: She is alert and oriented to person, place, and time.  Psychiatric:        Mood and Affect: Mood normal.  Assessment/Plan: 1.  Hyponatremia: She is euvolemic and with inappropriately elevated urine osmolality reflective of inappropriate elevation of ADH.  I suspect that this might be in part from her recent acute kidney injury and impaired free water handling as well as preceding use of Bactrim.  Sodium level essentially unchanged overnight with fluid restriction and salt tablets and today has been started on furosemide 10 mg daily (this will be temporary use to try and improve her hyponatremia).  She does not have any neurological manifestations and should be suitable enough for discharge when sodium level is >130 to follow-up with labs with her primary care provider. 2.  Hypertension: Blood pressure within acceptable range, she remains off of losartan and will get furosemide today. 3.  Rheumatoid arthritis: Chronic, on low-dose prednisone after brief burst of prednisone taper for TMJ dysfunction. 4.  Leukocytosis: Without focal signs of infection although had some leukocytes in her urine earlier.  This may indeed be from recent prednisone burst but would maintain close vigilance for possible UTI given initial UA findings.  Recommend urine culture/repeat urinalysis if WBC rises or she has fever. 5.  Hypothyroidism: She appears to be well controlled on current supplementation with levothyroxine with TSH within normal range.  Mukund Weinreb K. 12/10/2020, 1:36 PM

## 2020-12-11 DIAGNOSIS — E871 Hypo-osmolality and hyponatremia: Secondary | ICD-10-CM | POA: Diagnosis not present

## 2020-12-11 LAB — BASIC METABOLIC PANEL
Anion gap: 8 (ref 5–15)
BUN: 15 mg/dL (ref 8–23)
CO2: 25 mmol/L (ref 22–32)
Calcium: 8 mg/dL — ABNORMAL LOW (ref 8.9–10.3)
Chloride: 97 mmol/L — ABNORMAL LOW (ref 98–111)
Creatinine, Ser: 0.96 mg/dL (ref 0.44–1.00)
GFR, Estimated: 56 mL/min — ABNORMAL LOW (ref >60)
Glucose, Bld: 101 mg/dL — ABNORMAL HIGH (ref 70–99)
Potassium: 4 mmol/L (ref 3.5–5.1)
Sodium: 130 mmol/L — ABNORMAL LOW (ref 135–145)

## 2020-12-11 LAB — CBC
HCT: 34.1 % — ABNORMAL LOW (ref 36.0–46.0)
Hemoglobin: 11.4 g/dL — ABNORMAL LOW (ref 12.0–15.0)
MCH: 29.8 pg (ref 26.0–34.0)
MCHC: 33.4 g/dL (ref 30.0–36.0)
MCV: 89 fL (ref 80.0–100.0)
Platelets: 292 10*3/uL (ref 150–400)
RBC: 3.83 MIL/uL — ABNORMAL LOW (ref 3.87–5.11)
RDW: 12.4 % (ref 11.5–15.5)
WBC: 10.6 10*3/uL — ABNORMAL HIGH (ref 4.0–10.5)
nRBC: 0 % (ref 0.0–0.2)

## 2020-12-11 MED ORDER — FUROSEMIDE 20 MG PO TABS: 10.0000 mg | ORAL_TABLET | Freq: Every day | ORAL | 0 refills | Status: DC

## 2020-12-11 MED ORDER — POLYETHYLENE GLYCOL 3350 17 G PO PACK: 17.0000 g | PACK | Freq: Every day | ORAL | 0 refills | Status: DC

## 2020-12-11 MED ORDER — SODIUM CHLORIDE 1 G PO TABS: 1.0000 g | ORAL_TABLET | Freq: Three times a day (TID) | ORAL | 0 refills | Status: AC

## 2020-12-11 NOTE — Progress Notes (Signed)
Patient and daughter were given discharge instructions, and all questions were answered.  Patient was stable for discharged and was taken to the main exit by wheelchair.

## 2020-12-11 NOTE — Discharge Summary (Signed)
Physician Discharge Summary  [name] KCL:[id number] DOB: [birth year] DOA: 12/07/2020  PCP: Marda Stalker, PA-C  Admit date: 12/07/2020 Discharge date: 12/11/2020  Admitted From: ILF Disposition:ILF  Recommendations for Outpatient Follow-up:  1. Follow up with PCP in 1-2 weeks 2. Please obtain BMP/CBC in one week 3. Please follow up on the following pending results:  Home Health:HHPT  Equipment/Devices: DME-Shower chair  Discharge Condition: Stable Code Status:   Code Status: DNR Diet recommendation:  Diet Order            Diet general           Diet regular Room service appropriate? Yes; Fluid consistency: Thin; Fluid restriction: 1200 mL Fluid  Diet effective now                  Brief/Interim Summary: 87 year old female with RA on chronic prednisone 3 mg, TIA, HTN, osteoporosis presented with abnormal labs low sodium from her PCP office. as per the reporthas been having ongoing issues with constipation despite doing MiraLAX and drinking prune juice. She presented to her primary physician with the symptoms and also had labs drawn. Her labs returned with significant hyponatremia of 122, potassium of around 6.2 and elevated creatinine of 1.3 she was advised to present to the ED.patient has been having TMJ pain seen by ENT and was placed on Bactrim for possible sinusitis even higher dose of prednisone taper just about to finish and soft diet x2 weeks andhas only been able to drink soup and soft foods. In the ED had leukocytosis, low sodium 121 and was admitted. Her UA was abnormal but without urinary symptoms. She was given normal saline. X-ray showed increased colonic stool burden She was managed with stool softener Sodium did improve some with IV fluids then he started to downtrend placed on salt tablets. 1200 ML FLUID Restriction seen by nephrology and also had Lasix 10 mg started with improved sodium to 130.  Patient remains clinically stable and at this time is  stable for discharge.  Discussed with nephrologist.  Okay to discharge home on 3 more days of fluid restriction salt tablets and oral Lasix with follow-up BMP on Monday or Tuesday. discussed in detail with patient and patient's daughter at the bedside.  Discharge Diagnoses:  Hyponatremia,suspect multifactorial due to hypovolemia/dehydration,as well as from meds-Bactrim,  [place] osmol high at 600-Sodium did improve some with IV fluids then he started to downtrend placed on salt tablets. 1200 ML FLUID Restriction seen by nephrology and also had Lasix 10 mg started with improved sodium to 130.  Patient remains clinically stable and at this time is stable for discharge.  Discussed with nephrologist.  Okay to discharge home on 3 more days of fluid restriction salt tablets and oral Lasix with follow-up BMP on Monday or Tuesday  AKIlikely with poor oral intake dehydration/Bactrim:  Renal function has stabilized.  BMP on Monday. Leukocytosislikely from her steroid use.At 2.6K. She afebrile. She has recently completed Bactrim. Abnormal UAno urinary symptoms. Urine culture less than 10,000 colonies. Essential hypertension: BP stable on metoprolol resume losartan on discharge. Constipation: Continue on MiraLAX daily/PRN. RA on chronic prednisone back on 3 mg prednisone continue the same. finished steroid taper. Acquired hypothyroidism: On Synthroid TMJ dysfunction/painwith history of RA: With recent burst of prednisone now on home dose.  Continue ENT follow-up she has been taking soft diet.   Consults:  nephrology  Subjective: Aaox3, on beside chair and having her meal. upbeat abt going home today daughter at bedside  Discharge Exam: Vitals:   12/10/20 2027 12/11/20 0535  BP: (!) 149/56 (!) 136/57  Pulse: 64 65  Resp: 16 16  Temp: (!) 97.3 F (36.3 C) 97.8 F (36.6 C)  SpO2: 98% 98%   General: Pt is alert, awake, not in acute distress Cardiovascular: RRR, S1/S2 +, no rubs, no  gallops Respiratory: CTA bilaterally, no wheezing, no rhonchi Abdominal: Soft, NT, ND, bowel sounds + Extremities: no edema, no cyanosis  Discharge Instructions  Discharge Instructions    Diet general   Complete by: As directed    1200 ml/40 OZ  fluid restriction   Discharge instructions   Complete by: As directed    Check CBC BMP coming Monday or Tuesday. Cont salt tab, lasix and fluid restriction for 3 more days and resume regualr diet on Sunday  Please call call MD or return to ER for similar or worsening recurring problem that brought you to hospital or if any fever,nausea/vomiting,abdominal pain, uncontrolled pain, chest pain,  shortness of breath or any other alarming symptoms.  Please follow-up your doctor as instructed in a week time and call the office for appointment.  Please avoid alcohol, smoking, or any other illicit substance and maintain healthy habits including taking your regular medications as prescribed.  You were cared for by a hospitalist during your hospital stay. If you have any questions about your discharge medications or the care you received while you were in the hospital after you are discharged, you can call the unit and ask to speak with the hospitalist on call if the hospitalist that took care of you is not available.  Once you are discharged, your primary care physician will handle any further medical issues. Please note that NO REFILLS for any discharge medications will be authorized once you are discharged, as it is imperative that you return to your primary care physician (or establish a relationship with a primary care physician if you do not have one) for your aftercare needs so that they can reassess your need for medications and monitor your lab values   Increase activity slowly   Complete by: As directed      Allergies as of 12/11/2020      Reactions   Pollen Extract    Levofloxacin Anxiety   Agitation , nocturnal      Medication List     STOP taking these medications   sulfamethoxazole-trimethoprim 800-160 MG tablet Commonly known as: BACTRIM DS     TAKE these medications   aspirin EC 81 MG tablet Take 81 mg by mouth every evening.   cholecalciferol 1000 units tablet Commonly known as: VITAMIN D Take 1,000 Units by mouth daily.   diphenhydrAMINE 25 MG tablet Commonly known as: BENADRYL Take 12.5 mg by mouth at bedtime as needed for sleep or allergies.   furosemide 20 MG tablet Commonly known as: LASIX Take 0.5 tablets (10 mg total) by mouth daily for 3 days.   gentamicin ointment 0.1 % Commonly known as: GARAMYCIN Apply 1 application topically 3 (three) times daily.   levothyroxine 100 MCG tablet Commonly known as: SYNTHROID TAKE ONE TABLET ONCE A DAY ON AN EMPTY STOMACH. Patient needs appointment to receive further refills.   losartan 100 MG tablet Commonly known as: COZAAR TAKE 1 TABLET ONCE DAILY.   metoprolol succinate 50 MG 24 hr tablet Commonly known as: TOPROL-XL TAKE 1 TABLET ONCE DAILY IN THE EVENING.   omeprazole 40 MG capsule Commonly known as: PRILOSEC Take 40 mg by mouth every evening.  polyethylene glycol 17 g packet Commonly known as: MIRALAX / GLYCOLAX Take 17 g by mouth daily.   predniSONE 1 MG tablet Commonly known as: DELTASONE Take 3 mg by mouth daily. What changed: Another medication with the same name was removed. Continue taking this medication, and follow the directions you see here.   sodium chloride 1 g tablet Take 1 tablet (1 g total) by mouth 3 (three) times daily with meals for 3 days.       Follow-up Information    Marda Stalker, PA-C Follow up.   Specialty: Family Medicine Why: CBC BMP check in 5 to 7 days Contact information: [place] [address] [phone]              Allergies  Allergen Reactions  . Pollen Extract   . Levofloxacin Anxiety    Agitation , nocturnal    The results of significant diagnostics from  this hospitalization (including imaging, microbiology, ancillary and laboratory) are listed below for reference.    Microbiology: Recent Results (from the past 240 hour(s))  Culture, Urine     Status: Abnormal   Collection Time: 12/07/20  7:25 PM   Specimen: Urine, Random  Result Value Ref Range Status   Specimen Description   Final    URINE, RANDOM Performed at [facility] [address] [place], [place] [address]    Special Requests   Final    NONE Performed at [facility], [place] [address], [place], [place] [address]    Culture (A)  Final    <10,000 COLONIES/mL INSIGNIFICANT GROWTH Performed at [facility] [address] [place], [place] [address]    Report Status 12/09/2020 FINAL  Final  Resp Panel by RT-PCR (Flu A&B, Covid) Nasopharyngeal Swab     Status: None   Collection Time: 12/07/20 11:13 PM   Specimen: Nasopharyngeal Swab; Nasopharyngeal(NP) swabs in vial transport medium  Result Value Ref Range Status   SARS Coronavirus 2 by RT PCR NEGATIVE NEGATIVE Final    Comment: (NOTE) SARS-CoV-2 target nucleic acids are NOT DETECTED.  The SARS-CoV-2 RNA is generally detectable in upper respiratory specimens during the acute phase of infection. The lowest concentration of SARS-CoV-2 viral copies this assay can detect is 138 copies/mL. A negative result does not preclude SARS-Cov-2 infection and should not be used as the sole basis for treatment or other patient management decisions. A negative result may occur with  improper specimen collection/handling, submission of specimen other than nasopharyngeal swab, presence of viral mutation(s) within the areas targeted by this assay, and inadequate number of viral copies(<138 copies/mL). A negative result must be combined with clinical observations, patient history, and epidemiological information. The expected result is Negative.  Fact Sheet for Patients:   EntrepreneurPulse.com.au  Fact Sheet for Healthcare Providers:  IncredibleEmployment.be  This test is no t yet approved or cleared by the [place] FDA and  has been authorized for detection and/or diagnosis of SARS-CoV-2 by FDA under an Emergency Use Authorization (EUA). This EUA will remain  in effect (meaning this test can be used) for the duration of the COVID-19 declaration under Section 564(b)(1) of the Act, 21 U.S.C.section 360bbb-3(b)(1), unless the authorization is terminated  or revoked sooner.       Influenza A by PCR NEGATIVE NEGATIVE Final   Influenza B by PCR NEGATIVE NEGATIVE Final    Comment: (NOTE) The Xpert Xpress SARS-CoV-2/FLU/RSV plus assay is intended as an aid in the diagnosis of  influenza from Nasopharyngeal swab specimens and should not be used as a sole basis for treatment. Nasal washings and aspirates are unacceptable for Xpert Xpress SARS-CoV-2/FLU/RSV testing.  Fact Sheet for Patients: EntrepreneurPulse.com.au  Fact Sheet for Healthcare Providers: IncredibleEmployment.be  This test is not yet approved or cleared by the [place] FDA and has been authorized for detection and/or diagnosis of SARS-CoV-2 by FDA under an Emergency Use Authorization (EUA). This EUA will remain in effect (meaning this test can be used) for the duration of the COVID-19 declaration under Section 564(b)(1) of the Act, 21 U.S.C. section 360bbb-3(b)(1), unless the authorization is terminated or revoked.  Performed at [facility], [place] [address], [place], [place] [address]     Procedures/Studies: DG Abd Portable 1 View  Result Date: 12/07/2020 CLINICAL DATA:  PCP called today to inform pt that sodium levels at 122, potassium at 6.9, and WBC at 15.9. Severe constipation recently. Denies any abdominal pain. EXAM: PORTABLE ABDOMEN - 1 VIEW COMPARISON:  12/06/2020 FINDINGS:  There is no bowel dilation to suggest obstruction. Colonic stool appears mildly increased, similar to the prior radiographs. Soft tissues are unremarkable. No acute or significant skeletal abnormality. Lung bases are clear. IMPRESSION: 1. No acute findings. 2. Mild increased colonic stool burden similar to the previous day's study. Electronically Signed   By: Lajean Manes M.D.   On: 12/07/2020 21:07    Labs: BNP (last 3 results) No results for input(s): BNP in the last 8760 hours. Basic Metabolic Panel: Recent Labs  Lab 12/08/20 1624 12/09/20 0521 12/09/20 1222 12/10/20 0506 12/11/20 0507  NA 121* 128* 127* 127* 130*  K 4.9 4.3 3.8 4.1 4.0  CL 91* 96* 95* 97* 97*  CO2 23 25 25 22 25   GLUCOSE 169* 95 100* 99 101*  BUN 23 18 18 13 15   CREATININE 0.90 0.77 0.75 0.66 0.96  CALCIUM 8.0* 8.2* 8.0* 8.1* 8.0*   Liver Function Tests: No results for input(s): AST, ALT, ALKPHOS, BILITOT, PROT, ALBUMIN in the last 168 hours. No results for input(s): LIPASE, AMYLASE in the last 168 hours. No results for input(s): AMMONIA in the last 168 hours. CBC: Recent Labs  Lab 12/07/20 1925 12/08/20 0522 12/09/20 0521 12/10/20 0506 12/11/20 0507  WBC 14.2* 12.8* 11.5* 12.6* 10.6*  HGB 13.0 11.8* 12.1 11.9* 11.4*  HCT 38.5 35.6* 37.0 35.9* 34.1*  MCV 87.5 88.8 89.8 88.0 89.0  PLT 468* 356 354 331 292   Cardiac Enzymes: No results for input(s): CKTOTAL, CKMB, CKMBINDEX, TROPONINI in the last 168 hours. BNP: Invalid input(s): POCBNP CBG: No results for input(s): GLUCAP in the last 168 hours. D-Dimer No results for input(s): DDIMER in the last 72 hours. Hgb A1c No results for input(s): HGBA1C in the last 72 hours. Lipid Profile No results for input(s): CHOL, HDL, LDLCALC, TRIG, CHOLHDL, LDLDIRECT in the last 72 hours. Thyroid function studies Recent Labs    12/10/20 0506  TSH 1.783   Anemia work up No results for input(s): VITAMINB12, FOLATE, FERRITIN, TIBC, IRON, RETICCTPCT in the  last 72 hours. Urinalysis    Component Value Date/Time   COLORURINE YELLOW 12/07/2020 1925   APPEARANCEUR TURBID (A) 12/07/2020 1925   APPEARANCEUR Clear 05/01/2016 1209   LABSPEC 1.019 12/07/2020 1925   PHURINE 5.0 12/07/2020 1925   GLUCOSEU NEGATIVE 12/07/2020 1925   GLUCOSEU NEGATIVE 04/17/2016 1353   HGBUR NEGATIVE 12/07/2020 1925   BILIRUBINUR NEGATIVE 12/07/2020 1925   BILIRUBINUR Negative 05/01/2016 [place] 12/07/2020 1925  PROTEINUR NEGATIVE 12/07/2020 1925   UROBILINOGEN 0.2 04/17/2016 1404   UROBILINOGEN 0.2 04/17/2016 1353   NITRITE NEGATIVE 12/07/2020 1925   LEUKOCYTESUR MODERATE (A) 12/07/2020 1925   Sepsis Labs Invalid input(s): PROCALCITONIN,  WBC,  LACTICIDVEN Microbiology Recent Results (from the past 240 hour(s))  Culture, Urine     Status: Abnormal   Collection Time: 12/07/20  7:25 PM   Specimen: Urine, Random  Result Value Ref Range Status   Specimen Description   Final    URINE, RANDOM Performed at [facility], [place] [address], [place], [place] [address]    Special Requests   Final    NONE Performed at [facility], [place] [address] [place], [place] [address]    Culture (A)  Final    <10,000 COLONIES/mL INSIGNIFICANT GROWTH Performed at [facility] [address] [place], [place] [address]    Report Status 12/09/2020 FINAL  Final  Resp Panel by RT-PCR (Flu A&B, Covid) Nasopharyngeal Swab     Status: None   Collection Time: 12/07/20 11:13 PM   Specimen: Nasopharyngeal Swab; Nasopharyngeal(NP) swabs in vial transport medium  Result Value Ref Range Status   SARS Coronavirus 2 by RT PCR NEGATIVE NEGATIVE Final    Comment: (NOTE) SARS-CoV-2 target nucleic acids are NOT DETECTED.  The SARS-CoV-2 RNA is generally detectable in upper respiratory specimens during the acute phase of infection. The lowest concentration of SARS-CoV-2 viral copies this assay can detect is 138 copies/mL. A  negative result does not preclude SARS-Cov-2 infection and should not be used as the sole basis for treatment or other patient management decisions. A negative result may occur with  improper specimen collection/handling, submission of specimen other than nasopharyngeal swab, presence of viral mutation(s) within the areas targeted by this assay, and inadequate number of viral copies(<138 copies/mL). A negative result must be combined with clinical observations, patient history, and epidemiological information. The expected result is Negative.  Fact Sheet for Patients:  EntrepreneurPulse.com.au  Fact Sheet for Healthcare Providers:  IncredibleEmployment.be  This test is no t yet approved or cleared by the [place] FDA and  has been authorized for detection and/or diagnosis of SARS-CoV-2 by FDA under an Emergency Use Authorization (EUA). This EUA will remain  in effect (meaning this test can be used) for the duration of the COVID-19 declaration under Section 564(b)(1) of the Act, 21 U.S.C.section 360bbb-3(b)(1), unless the authorization is terminated  or revoked sooner.       Influenza A by PCR NEGATIVE NEGATIVE Final   Influenza B by PCR NEGATIVE NEGATIVE Final    Comment: (NOTE) The Xpert Xpress SARS-CoV-2/FLU/RSV plus assay is intended as an aid in the diagnosis of influenza from Nasopharyngeal swab specimens and should not be used as a sole basis for treatment. Nasal washings and aspirates are unacceptable for Xpert Xpress SARS-CoV-2/FLU/RSV testing.  Fact Sheet for Patients: EntrepreneurPulse.com.au  Fact Sheet for Healthcare Providers: IncredibleEmployment.be  This test is not yet approved or cleared by the [place] FDA and has been authorized for detection and/or diagnosis of SARS-CoV-2 by FDA under an Emergency Use Authorization (EUA). This EUA will remain in effect (meaning this test can  be used) for the duration of the COVID-19 declaration under Section 564(b)(1) of the Act, 21 U.S.C. section 360bbb-3(b)(1), unless the authorization is terminated or revoked.  Performed at [facility], [place] [address], [place], [place] [address]     Time coordinating discharge: 25 minutes  SIGNED: Antonieta Pert,  MD  [facility] 12/11/2020, 10:12 AM  If 7PM-7AM, please contact night-coverage [email or web address]

## 2020-12-12 NOTE — Telephone Encounter (Signed)
Patient states she is feeling better since she called. She states that she lives in [place] now and does not want to come to our office unless she has to.

## 2020-12-16 DIAGNOSIS — R899 Unspecified abnormal finding in specimens from other organs, systems and tissues: Secondary | ICD-10-CM | POA: Diagnosis not present

## 2020-12-16 DIAGNOSIS — K929 Disease of digestive system, unspecified: Secondary | ICD-10-CM | POA: Diagnosis not present

## 2020-12-16 DIAGNOSIS — E871 Hypo-osmolality and hyponatremia: Secondary | ICD-10-CM | POA: Diagnosis not present

## 2020-12-16 DIAGNOSIS — I10 Essential (primary) hypertension: Secondary | ICD-10-CM | POA: Diagnosis not present

## 2020-12-17 DIAGNOSIS — R2681 Unsteadiness on feet: Secondary | ICD-10-CM | POA: Diagnosis not present

## 2020-12-17 DIAGNOSIS — E878 Other disorders of electrolyte and fluid balance, not elsewhere classified: Secondary | ICD-10-CM | POA: Diagnosis not present

## 2020-12-30 DIAGNOSIS — M26609 Unspecified temporomandibular joint disorder, unspecified side: Secondary | ICD-10-CM | POA: Diagnosis not present

## 2020-12-30 DIAGNOSIS — R1310 Dysphagia, unspecified: Secondary | ICD-10-CM | POA: Diagnosis not present

## 2020-12-30 DIAGNOSIS — J340 Abscess, furuncle and carbuncle of nose: Secondary | ICD-10-CM | POA: Diagnosis not present

## 2021-01-06 ENCOUNTER — Other Ambulatory Visit (HOSPITAL_COMMUNITY): Payer: Self-pay

## 2021-01-06 DIAGNOSIS — R1310 Dysphagia, unspecified: Secondary | ICD-10-CM

## 2021-01-06 DIAGNOSIS — R059 Cough, unspecified: Secondary | ICD-10-CM

## 2021-01-13 DIAGNOSIS — I10 Essential (primary) hypertension: Secondary | ICD-10-CM | POA: Diagnosis not present

## 2021-01-13 DIAGNOSIS — M069 Rheumatoid arthritis, unspecified: Secondary | ICD-10-CM | POA: Diagnosis not present

## 2021-01-13 DIAGNOSIS — M810 Age-related osteoporosis without current pathological fracture: Secondary | ICD-10-CM | POA: Diagnosis not present

## 2021-01-13 DIAGNOSIS — E039 Hypothyroidism, unspecified: Secondary | ICD-10-CM | POA: Diagnosis not present

## 2021-01-15 DIAGNOSIS — R194 Change in bowel habit: Secondary | ICD-10-CM | POA: Diagnosis not present

## 2021-01-15 DIAGNOSIS — K5900 Constipation, unspecified: Secondary | ICD-10-CM | POA: Diagnosis not present

## 2021-01-16 ENCOUNTER — Other Ambulatory Visit: Payer: Self-pay

## 2021-01-16 ENCOUNTER — Ambulatory Visit (HOSPITAL_COMMUNITY)
Admission: RE | Admit: 2021-01-16 | Discharge: 2021-01-16 | Disposition: A | Discharge status: Home / Self Care | Payer: Medicare Other | Source: Ambulatory Visit | Attending: Unknown Physician Specialty | Admitting: Unknown Physician Specialty

## 2021-01-16 DIAGNOSIS — R1310 Dysphagia, unspecified: Secondary | ICD-10-CM | POA: Diagnosis not present

## 2021-01-16 DIAGNOSIS — R059 Cough, unspecified: Secondary | ICD-10-CM | POA: Diagnosis not present

## 2021-02-18 DIAGNOSIS — Z23 Encounter for immunization: Secondary | ICD-10-CM | POA: Diagnosis not present

## 2021-02-25 DIAGNOSIS — M810 Age-related osteoporosis without current pathological fracture: Secondary | ICD-10-CM | POA: Diagnosis not present

## 2021-02-27 DIAGNOSIS — K5900 Constipation, unspecified: Secondary | ICD-10-CM | POA: Diagnosis not present

## 2021-03-04 DIAGNOSIS — M069 Rheumatoid arthritis, unspecified: Secondary | ICD-10-CM | POA: Diagnosis not present

## 2021-03-04 DIAGNOSIS — E039 Hypothyroidism, unspecified: Secondary | ICD-10-CM | POA: Diagnosis not present

## 2021-03-04 DIAGNOSIS — M810 Age-related osteoporosis without current pathological fracture: Secondary | ICD-10-CM | POA: Diagnosis not present

## 2021-03-04 DIAGNOSIS — I10 Essential (primary) hypertension: Secondary | ICD-10-CM | POA: Diagnosis not present

## 2021-03-12 DIAGNOSIS — M810 Age-related osteoporosis without current pathological fracture: Secondary | ICD-10-CM | POA: Diagnosis not present

## 2021-03-12 DIAGNOSIS — E559 Vitamin D deficiency, unspecified: Secondary | ICD-10-CM | POA: Diagnosis not present

## 2021-03-12 DIAGNOSIS — Z0000 Encounter for general adult medical examination without abnormal findings: Secondary | ICD-10-CM | POA: Diagnosis not present

## 2021-03-12 DIAGNOSIS — I10 Essential (primary) hypertension: Secondary | ICD-10-CM | POA: Diagnosis not present

## 2021-03-12 DIAGNOSIS — E039 Hypothyroidism, unspecified: Secondary | ICD-10-CM | POA: Diagnosis not present

## 2021-03-12 DIAGNOSIS — M069 Rheumatoid arthritis, unspecified: Secondary | ICD-10-CM | POA: Diagnosis not present

## 2021-03-13 ENCOUNTER — Other Ambulatory Visit: Payer: Self-pay | Admitting: Family Medicine

## 2021-03-13 DIAGNOSIS — M810 Age-related osteoporosis without current pathological fracture: Secondary | ICD-10-CM

## 2021-03-18 DIAGNOSIS — E559 Vitamin D deficiency, unspecified: Secondary | ICD-10-CM | POA: Diagnosis not present

## 2021-03-18 DIAGNOSIS — M150 Primary generalized (osteo)arthritis: Secondary | ICD-10-CM | POA: Diagnosis not present

## 2021-03-18 DIAGNOSIS — M0600 Rheumatoid arthritis without rheumatoid factor, unspecified site: Secondary | ICD-10-CM | POA: Diagnosis not present

## 2021-03-18 DIAGNOSIS — M810 Age-related osteoporosis without current pathological fracture: Secondary | ICD-10-CM | POA: Diagnosis not present

## 2021-03-18 DIAGNOSIS — Z6825 Body mass index (BMI) 25.0-25.9, adult: Secondary | ICD-10-CM | POA: Diagnosis not present

## 2021-03-18 DIAGNOSIS — Z840 Family history of diseases of the skin and subcutaneous tissue: Secondary | ICD-10-CM | POA: Diagnosis not present

## 2021-03-18 DIAGNOSIS — M25552 Pain in left hip: Secondary | ICD-10-CM | POA: Diagnosis not present

## 2021-03-18 DIAGNOSIS — R768 Other specified abnormal immunological findings in serum: Secondary | ICD-10-CM | POA: Diagnosis not present

## 2021-03-18 DIAGNOSIS — Z8739 Personal history of other diseases of the musculoskeletal system and connective tissue: Secondary | ICD-10-CM | POA: Diagnosis not present

## 2021-03-18 DIAGNOSIS — E663 Overweight: Secondary | ICD-10-CM | POA: Diagnosis not present

## 2021-03-27 DIAGNOSIS — R531 Weakness: Secondary | ICD-10-CM | POA: Diagnosis not present

## 2021-03-27 DIAGNOSIS — N3001 Acute cystitis with hematuria: Secondary | ICD-10-CM | POA: Diagnosis not present

## 2021-03-27 DIAGNOSIS — R5383 Other fatigue: Secondary | ICD-10-CM | POA: Diagnosis not present

## 2021-04-15 DIAGNOSIS — M25552 Pain in left hip: Secondary | ICD-10-CM | POA: Diagnosis not present

## 2021-04-15 DIAGNOSIS — Z840 Family history of diseases of the skin and subcutaneous tissue: Secondary | ICD-10-CM | POA: Diagnosis not present

## 2021-04-15 DIAGNOSIS — M0600 Rheumatoid arthritis without rheumatoid factor, unspecified site: Secondary | ICD-10-CM | POA: Diagnosis not present

## 2021-04-15 DIAGNOSIS — E663 Overweight: Secondary | ICD-10-CM | POA: Diagnosis not present

## 2021-04-15 DIAGNOSIS — E559 Vitamin D deficiency, unspecified: Secondary | ICD-10-CM | POA: Diagnosis not present

## 2021-04-15 DIAGNOSIS — M810 Age-related osteoporosis without current pathological fracture: Secondary | ICD-10-CM | POA: Diagnosis not present

## 2021-04-15 DIAGNOSIS — K5904 Chronic idiopathic constipation: Secondary | ICD-10-CM | POA: Diagnosis not present

## 2021-04-15 DIAGNOSIS — R768 Other specified abnormal immunological findings in serum: Secondary | ICD-10-CM | POA: Diagnosis not present

## 2021-04-15 DIAGNOSIS — M150 Primary generalized (osteo)arthritis: Secondary | ICD-10-CM | POA: Diagnosis not present

## 2021-04-15 DIAGNOSIS — Z8739 Personal history of other diseases of the musculoskeletal system and connective tissue: Secondary | ICD-10-CM | POA: Diagnosis not present

## 2021-04-15 DIAGNOSIS — Z6825 Body mass index (BMI) 25.0-25.9, adult: Secondary | ICD-10-CM | POA: Diagnosis not present

## 2021-04-21 DIAGNOSIS — M25519 Pain in unspecified shoulder: Secondary | ICD-10-CM | POA: Diagnosis not present

## 2021-04-21 DIAGNOSIS — I447 Left bundle-branch block, unspecified: Secondary | ICD-10-CM | POA: Diagnosis not present

## 2021-04-22 DIAGNOSIS — L821 Other seborrheic keratosis: Secondary | ICD-10-CM | POA: Diagnosis not present

## 2021-04-22 DIAGNOSIS — D239 Other benign neoplasm of skin, unspecified: Secondary | ICD-10-CM | POA: Diagnosis not present

## 2021-04-22 DIAGNOSIS — C44329 Squamous cell carcinoma of skin of other parts of face: Secondary | ICD-10-CM | POA: Diagnosis not present

## 2021-04-22 DIAGNOSIS — D485 Neoplasm of uncertain behavior of skin: Secondary | ICD-10-CM | POA: Diagnosis not present

## 2021-04-22 DIAGNOSIS — L814 Other melanin hyperpigmentation: Secondary | ICD-10-CM | POA: Diagnosis not present

## 2021-04-22 DIAGNOSIS — Z85828 Personal history of other malignant neoplasm of skin: Secondary | ICD-10-CM | POA: Diagnosis not present

## 2021-04-23 DIAGNOSIS — M25511 Pain in right shoulder: Secondary | ICD-10-CM | POA: Diagnosis not present

## 2021-04-25 ENCOUNTER — Ambulatory Visit: Payer: Medicare Other | Admitting: Cardiology

## 2021-04-25 ENCOUNTER — Encounter: Payer: Self-pay | Admitting: Cardiology

## 2021-04-25 ENCOUNTER — Other Ambulatory Visit: Payer: Self-pay

## 2021-04-25 VITALS — BP 177/75 | HR 70 | Temp 97.6°F | Resp 16 | Ht 62.5 in | Wt 141.6 lb

## 2021-04-25 DIAGNOSIS — E7800 Pure hypercholesterolemia, unspecified: Secondary | ICD-10-CM | POA: Diagnosis not present

## 2021-04-25 DIAGNOSIS — I10 Essential (primary) hypertension: Secondary | ICD-10-CM

## 2021-04-25 DIAGNOSIS — I209 Angina pectoris, unspecified: Secondary | ICD-10-CM | POA: Diagnosis not present

## 2021-04-25 DIAGNOSIS — I447 Left bundle-branch block, unspecified: Secondary | ICD-10-CM

## 2021-04-25 MED ORDER — AMLODIPINE BESYLATE 5 MG PO TABS: 5.0000 mg | ORAL_TABLET | Freq: Every day | ORAL | 2 refills | Status: DC

## 2021-04-25 MED ORDER — NITROGLYCERIN 0.4 MG SL SUBL: 0.4000 mg | SUBLINGUAL_TABLET | SUBLINGUAL | 3 refills | Status: DC | PRN

## 2021-04-25 MED ORDER — ATORVASTATIN CALCIUM 10 MG PO TABS: 10.0000 mg | ORAL_TABLET | Freq: Every day | ORAL | 2 refills | Status: DC

## 2021-04-25 MED ORDER — ISOSORBIDE MONONITRATE ER 30 MG PO TB24: 30.0000 mg | ORAL_TABLET | Freq: Every day | ORAL | 2 refills | Status: DC

## 2021-04-25 NOTE — Progress Notes (Signed)
Primary Physician/Referring:  Salley Slaughter, PA-C  Patient ID: [name], female    DOB: 10/15/1927, 87 y.o.   MRN: [id number]  Chief Complaint  Patient presents with   LBBB   New Patient (Initial Visit)    Referred by Loura Halt, NP   HPI:    [name]  is a 87 y.o. Caucasian female with history of hypertension, hypothyroidism, Rheumatoid arthritis.  She is referred to our office by PCP for further evaluation of left bundle branch block and right arm pain that started about 2 to 3 weeks ago.  PCP recently did EKG on 04/21/2021 revealing left bundle branch block, unclear if it is new or old.  Patient's daughter used to work in [employer] at [employer] and requested further evaluation by cardiology.  Patient states that she has been evaluated by orthopedics and the exercise does not help.  She has already been active at her gym and does all those activities that she was recommended to do.  States that the discomfort in her arm comes on especially after walking and she sits down and last for about 20 to 30 minutes and spontaneously resolves.  She has occasionally also had discomfort in the right arm at rest.  No other associated symptoms.  She also complains of dyspnea on exertion only on walking uphill.  This appears to be going on for several months to a year or more.  No PND or orthopnea.   Past Medical History:  Diagnosis Date   Arthritis    Carpal tunnel syndrome    Gait abnormality    has for 81yr-no use of cane   Hx of TIA (transient ischemic attack) and stroke    Hypertension    Hypothyroidism    Osteoporosis    Wears glasses    Wears hearing aid    Past Surgical History:  Procedure Laterality Date   APPENDECTOMY     CARPAL TUNNEL RELEASE Left 07/11/2013   Procedure: CARPAL TUNNEL RELEASE;  Surgeon: Cammie Sickle., MD;  Location: [facility];  Service: Orthopedics;  Laterality: Left;   COLONOSCOPY     SUBDURAL HEMATOMA EVACUATION VIA  CRANIOTOMY  2007   TONSILLECTOMY     Family History  Problem Relation Age of Onset   Cancer Mother 30       breast ca   Cancer Maternal Aunt    Cancer Brother        pancreatic ca   Breast cancer Sister    Brain cancer Brother        astrocytoma?   Kidney cancer Sister     Social History   Tobacco Use   Smoking status: Never   Smokeless tobacco: Never  Substance Use Topics   Alcohol use: Yes    Comment: occ   Marital Status: Widowed   ROS  Review of Systems  Cardiovascular:  Negative for chest pain (Right arm discomfort with exertion and also at times at rest.), dyspnea on exertion (When she walks uphill, chronic.) and leg swelling.  Musculoskeletal:  Positive for arthritis.  Gastrointestinal:  Negative for melena.   Objective  Blood pressure (!) 177/75, pulse 70, temperature 97.6 F (36.4 C), temperature source Temporal, resp. rate 16, height 5' 2.5" (1.588 m), weight 141 lb 9.6 oz (64.2 kg), SpO2 98 %.  Vitals with BMI 04/25/2021 04/25/2021 12/11/2020  Height - 5' 2.5" -  Weight - 141 lbs 10 oz -  BMI - 78.67 -  Systolic  637 858 850  Diastolic 75 74 57  Pulse 70 73 65    Physical Exam Neck:     Vascular: No carotid bruit or JVD.  Cardiovascular:     Rate and Rhythm: Normal rate and regular rhythm.     Pulses: Intact distal pulses.     Heart sounds: Normal heart sounds. No murmur heard.   No gallop.  Pulmonary:     Effort: Pulmonary effort is normal.     Breath sounds: Normal breath sounds.  Abdominal:     General: Bowel sounds are normal.     Palpations: Abdomen is soft.  Musculoskeletal:        General: No swelling.   Laboratory examination:   Recent Labs    12/09/20 1222 12/10/20 0506 12/11/20 0507  NA 127* 127* 130*  K 3.8 4.1 4.0  CL 95* 97* 97*  CO2 25 22 25   GLUCOSE 100* 99 101*  BUN 18 13 15   CREATININE 0.75 0.66 0.96  CALCIUM 8.0* 8.1* 8.0*  GFRNONAA >60 >60 56*   CrCl cannot be calculated (Patient's most recent lab result is older  than the maximum 21 days allowed.).  CMP Latest Ref Rng & Units 12/11/2020 12/10/2020 12/09/2020  Glucose 70 - 99 mg/dL 101(H) 99 100(H)  BUN 8 - 23 mg/dL 15 13 18   Creatinine 0.44 - 1.00 mg/dL 0.96 0.66 0.75  Sodium 135 - 145 mmol/L 130(L) 127(L) 127(L)  Potassium 3.5 - 5.1 mmol/L 4.0 4.1 3.8  Chloride 98 - 111 mmol/L 97(L) 97(L) 95(L)  CO2 22 - 32 mmol/L 25 22 25   Calcium 8.9 - 10.3 mg/dL 8.0(L) 8.1(L) 8.0(L)  Total Protein 6.0 - 8.3 g/dL - - -  Total Bilirubin 0.2 - 1.2 mg/dL - - -  Alkaline Phos 39 - 117 U/L - - -  AST 0 - 37 U/L - - -  ALT 0 - 35 U/L - - -   CBC Latest Ref Rng & Units 12/11/2020 12/10/2020 12/09/2020  WBC 4.0 - 10.5 K/uL 10.6(H) 12.6(H) 11.5(H)  Hemoglobin 12.0 - 15.0 g/dL 11.4(L) 11.9(L) 12.1  Hematocrit 36.0 - 46.0 % 34.1(L) 35.9(L) 37.0  Platelets 150 - 400 K/uL 292 331 354  HEMOGLOBIN A1C Lab Results  Component Value Date   HGBA1C 5.8 09/13/2015   TSH Recent Labs    12/10/20 0506  TSH 1.783   External labs:   03/27/2021: Hgb 12.4, HCT 37.9, MCV 86.4, PLT 329 Glucose 85, BUN 19, creatinine 1.07, GFR 49, sodium 133, potassium 4.8, ALP 68, AST 17, ALT 16  03/13/2021: BUN 19, creatinine 0.88, GFR 62, sodium 136, potassium 4.7 TSH 0.68 Vitamin D 44.6 Total cholesterol 95, HDL 51, triglycerides 145, LDL 108  Allergies   Allergies  Allergen Reactions   Pollen Extract    Levofloxacin Anxiety    Agitation , nocturnal    Medications Prior to Visit:   Outpatient Medications Prior to Visit  Medication Sig Dispense Refill   aspirin EC 81 MG tablet Take 81 mg by mouth every evening.     cholecalciferol (VITAMIN D) 1000 units tablet Take 1,000 Units by mouth daily.     denosumab (PROLIA) 60 MG/ML SOSY injection Inject 60 mg into the skin every 6 (six) months.     diphenhydrAMINE (BENADRYL) 25 MG tablet Take 12.5 mg by mouth at bedtime as needed for sleep or allergies.     folic acid (FOLVITE) 1 MG tablet Take 1 mg by mouth daily.     levothyroxine  (SYNTHROID)  100 MCG tablet TAKE ONE TABLET ONCE A DAY ON AN EMPTY STOMACH. Patient needs appointment to receive further refills. 90 tablet 0   losartan (COZAAR) 100 MG tablet TAKE 1 TABLET ONCE DAILY. 90 tablet 0   Methotrexate Sodium (METHOTREXATE, PF,) 50 MG/2ML injection 0.87mL Injection once weekly 30 days     metoprolol succinate (TOPROL-XL) 50 MG 24 hr tablet TAKE 1 TABLET ONCE DAILY IN THE EVENING. 30 tablet 0   omeprazole (PRILOSEC) 40 MG capsule Take 40 mg by mouth every evening.     predniSONE (DELTASONE) 1 MG tablet Take 3 mg by mouth daily.     furosemide (LASIX) 20 MG tablet Take 0.5 tablets (10 mg total) by mouth daily for 3 days. 1.5 tablet 0   gentamicin ointment (GARAMYCIN) 0.1 % Apply 1 application topically 3 (three) times daily.     polyethylene glycol (MIRALAX / GLYCOLAX) 17 g packet Take 17 g by mouth daily. 14 each 0   No facility-administered medications prior to visit.   Final Medications at End of Visit    Current Meds  Medication Sig   amLODipine (NORVASC) 5 MG tablet Take 1 tablet (5 mg total) by mouth daily in the afternoon.   aspirin EC 81 MG tablet Take 81 mg by mouth every evening.   atorvastatin (LIPITOR) 10 MG tablet Take 1 tablet (10 mg total) by mouth daily.   cholecalciferol (VITAMIN D) 1000 units tablet Take 1,000 Units by mouth daily.   denosumab (PROLIA) 60 MG/ML SOSY injection Inject 60 mg into the skin every 6 (six) months.   diphenhydrAMINE (BENADRYL) 25 MG tablet Take 12.5 mg by mouth at bedtime as needed for sleep or allergies.   folic acid (FOLVITE) 1 MG tablet Take 1 mg by mouth daily.   isosorbide mononitrate (IMDUR) 30 MG 24 hr tablet Take 1 tablet (30 mg total) by mouth daily.   levothyroxine (SYNTHROID) 100 MCG tablet TAKE ONE TABLET ONCE A DAY ON AN EMPTY STOMACH. Patient needs appointment to receive further refills.   losartan (COZAAR) 100 MG tablet TAKE 1 TABLET ONCE DAILY.   Methotrexate Sodium (METHOTREXATE, PF,) 50 MG/2ML injection  0.30mL Injection once weekly 30 days   metoprolol succinate (TOPROL-XL) 50 MG 24 hr tablet TAKE 1 TABLET ONCE DAILY IN THE EVENING.   nitroGLYCERIN (NITROSTAT) 0.4 MG SL tablet Place 1 tablet (0.4 mg total) under the tongue every 5 (five) minutes as needed for up to 25 days for chest pain.   omeprazole (PRILOSEC) 40 MG capsule Take 40 mg by mouth every evening.   predniSONE (DELTASONE) 1 MG tablet Take 3 mg by mouth daily.     Radiology:   No results found.  Cardiac Studies:   Echocardiogram 04/15/2016:   - Left ventricle: The cavity size was normal. There was mild   concentric hypertrophy. Systolic function was normal. The   estimated ejection fraction was in the range of 60% to 65%. Wall  motion was normal; there were no regional wall motion   abnormalities. Doppler parameters are consistent with abnormal  left ventricular relaxation (grade 1 diastolic dysfunction). - Aortic valve: There was mild regurgitation. - Mitral valve: There was mild regurgitation. - Pulmonary arteries: Systolic pressure was mildly increased. PA  peak pressure: 35 mm Hg (S).   Impressions: - No cardiac source of emboli was indentified.  EKG:   EKG 04/25/2021: Sinus rhythm with first-degree AV block at rate of 66 bpm, left bundle branch block.  No further analysis.  Abnormal EKG.  Compared to 07/03/2010, first-degree AV block is new but otherwise no change.  No significant change from 07/03/2010.  Compared to 05/18/2009, normal sinus rhythm.  Assessment     ICD-10-CM   1. Essential hypertension  I10 EKG 12-Lead    2. Angina pectoris (HCC)  I20.9 amLODipine (NORVASC) 5 MG tablet    isosorbide mononitrate (IMDUR) 30 MG 24 hr tablet    PCV ECHOCARDIOGRAM COMPLETE    nitroGLYCERIN (NITROSTAT) 0.4 MG SL tablet    atorvastatin (LIPITOR) 10 MG tablet    3. LBBB (left bundle branch block)  I44.7     4. Hypercholesteremia  E78.00 atorvastatin (LIPITOR) 10 MG tablet       Medications Discontinued During This  Encounter  Medication Reason   furosemide (LASIX) 20 MG tablet Error   gentamicin ointment (GARAMYCIN) 0.1 % Error   polyethylene glycol (MIRALAX / GLYCOLAX) 17 g packet Error    Meds ordered this encounter  Medications   amLODipine (NORVASC) 5 MG tablet    Sig: Take 1 tablet (5 mg total) by mouth daily in the afternoon.    Dispense:  30 tablet    Refill:  2   isosorbide mononitrate (IMDUR) 30 MG 24 hr tablet    Sig: Take 1 tablet (30 mg total) by mouth daily.    Dispense:  30 tablet    Refill:  2   nitroGLYCERIN (NITROSTAT) 0.4 MG SL tablet    Sig: Place 1 tablet (0.4 mg total) under the tongue every 5 (five) minutes as needed for up to 25 days for chest pain.    Dispense:  25 tablet    Refill:  3   atorvastatin (LIPITOR) 10 MG tablet    Sig: Take 1 tablet (10 mg total) by mouth daily.    Dispense:  30 tablet    Refill:  2     Recommendations:   Lagretta Loseke Boldon is a 87 y.o. Caucasian female patient who lives independently in an assisted living facility, drives, has had remote stroke, hypertension referred to me for evaluation of abnormal EKG and right arm discomfort. She has also had remote subdural hematoma evacuation by craniotomy in 2007 after an accidental injury, degenerative joint disease.  Her symptoms are very concerning for typical angina pectoris.  She has exertional component to her right arm pain with spontaneous relief.  In view of her advanced age, I would like to try medical therapy first and if it fails probably proceeding directly with cardiac catheterization is more appropriate.  I have started her on amlodipine 5 mg daily both for hypertension and for angina pectoris along with isosorbide mononitrate 30 mg daily.  Continue aspirin in view of prior TIA and also angina pectoris.  Lipids are mildly elevated, however I would like to start her on a low-dose high intensity statin with Lipitor 10 mg daily.  I would like to see her back in 6 weeks for follow-up, I will  obtain an echocardiogram.  Daughter is present at the bedside, she is a retired [employer]er.  All questions answered.  This was a 60-minute office visit encounter.  It was a great pleasure to meet this charming 89-year young female patient in our office today.   Adrian Prows, MD, [facility] 04/25/2021, 11:56 AM Office: [phone] Fax: [phone] Pager: [phone]

## 2021-04-30 ENCOUNTER — Emergency Department (HOSPITAL_BASED_OUTPATIENT_CLINIC_OR_DEPARTMENT_OTHER)
Admission: EM | Admit: 2021-04-30 | Discharge: 2021-04-30 | Disposition: A | Discharge status: Home / Self Care | Payer: Medicare Other | Attending: Emergency Medicine | Admitting: Emergency Medicine

## 2021-04-30 ENCOUNTER — Emergency Department (HOSPITAL_BASED_OUTPATIENT_CLINIC_OR_DEPARTMENT_OTHER): Payer: Medicare Other | Admitting: Radiology

## 2021-04-30 ENCOUNTER — Other Ambulatory Visit: Payer: Self-pay

## 2021-04-30 ENCOUNTER — Encounter (HOSPITAL_BASED_OUTPATIENT_CLINIC_OR_DEPARTMENT_OTHER): Payer: Self-pay

## 2021-04-30 DIAGNOSIS — R0789 Other chest pain: Secondary | ICD-10-CM | POA: Insufficient documentation

## 2021-04-30 DIAGNOSIS — Z8673 Personal history of transient ischemic attack (TIA), and cerebral infarction without residual deficits: Secondary | ICD-10-CM | POA: Diagnosis not present

## 2021-04-30 DIAGNOSIS — R079 Chest pain, unspecified: Secondary | ICD-10-CM

## 2021-04-30 DIAGNOSIS — Z7982 Long term (current) use of aspirin: Secondary | ICD-10-CM | POA: Diagnosis not present

## 2021-04-30 DIAGNOSIS — E039 Hypothyroidism, unspecified: Secondary | ICD-10-CM | POA: Diagnosis not present

## 2021-04-30 DIAGNOSIS — Z79899 Other long term (current) drug therapy: Secondary | ICD-10-CM | POA: Diagnosis not present

## 2021-04-30 DIAGNOSIS — K449 Diaphragmatic hernia without obstruction or gangrene: Secondary | ICD-10-CM | POA: Diagnosis not present

## 2021-04-30 DIAGNOSIS — I10 Essential (primary) hypertension: Secondary | ICD-10-CM | POA: Diagnosis not present

## 2021-04-30 LAB — CBC
HCT: 35.3 % — ABNORMAL LOW (ref 36.0–46.0)
Hemoglobin: 11.5 g/dL — ABNORMAL LOW (ref 12.0–15.0)
MCH: 28.5 pg (ref 26.0–34.0)
MCHC: 32.6 g/dL (ref 30.0–36.0)
MCV: 87.6 fL (ref 80.0–100.0)
Platelets: 308 10*3/uL (ref 150–400)
RBC: 4.03 MIL/uL (ref 3.87–5.11)
RDW: 14.2 % (ref 11.5–15.5)
WBC: 9.1 10*3/uL (ref 4.0–10.5)
nRBC: 0 % (ref 0.0–0.2)

## 2021-04-30 LAB — BASIC METABOLIC PANEL
Anion gap: 9 (ref 5–15)
BUN: 19 mg/dL (ref 8–23)
CO2: 23 mmol/L (ref 22–32)
Calcium: 9.1 mg/dL (ref 8.9–10.3)
Chloride: 96 mmol/L — ABNORMAL LOW (ref 98–111)
Creatinine, Ser: 0.87 mg/dL (ref 0.44–1.00)
GFR, Estimated: >60 mL/min (ref >60)
Glucose, Bld: 108 mg/dL — ABNORMAL HIGH (ref 70–99)
Potassium: 4.5 mmol/L (ref 3.5–5.1)
Sodium: 128 mmol/L — ABNORMAL LOW (ref 135–145)

## 2021-04-30 LAB — TROPONIN I (HIGH SENSITIVITY)
Troponin I (High Sensitivity): 15 ng/L (ref <18)
Troponin I (High Sensitivity): 13 ng/L (ref <18)

## 2021-04-30 MED ORDER — ACETAMINOPHEN 325 MG PO TABS
650.0000 mg | ORAL_TABLET | Freq: Once | ORAL | Status: AC
  Administered 2021-04-30: 650 mg via ORAL
  Filled 2021-04-30: qty 2

## 2021-04-30 NOTE — ED Triage Notes (Signed)
She tells me she has had some distinctly left-sided lat. Chest area pain. She took 3 s.l. ntg at home prior to arrival with no appreciable resolution of the discomfort. Her skin is normal, warm and dry and she is breathing normally.

## 2021-04-30 NOTE — ED Provider Notes (Signed)
[facility] EMERGENC[facility] DEPT Provider Note   CSN: [id number] Arrival date & time: 04/30/21  1642     History Chief Complaint  Patient presents with   Chest Pain    [name] is a 87 y.o. female.  87 year old female presents with sudden onset of left-sided chest chest comfort.  Patient characterizes it as sharp pain worse with certain positions.  No associated nausea vomiting or diaphoresis.  Used 3 nitroglycerin without relief.  Denies any recent fever or cough.  Pain with remaining still.  Denies any history of trauma.  No rashes noted to the area.      Past Medical History:  Diagnosis Date   Arthritis    Carpal tunnel syndrome    Gait abnormality    has for 66yr-no use of cane   Hx of TIA (transient ischemic attack) and stroke    Hypertension    Hypothyroidism    Osteoporosis    Wears glasses    Wears hearing aid     Patient Active Problem List   Diagnosis Date Noted   Hyponatremia 12/08/2020   AKI (acute kidney injury) ([place]) 12/08/2020   Forearm laceration, left, initial encounter 03/08/2019   Counseling regarding end of life decision making 11/27/2018   Osteoporosis, post-menopausal 08/04/2018   Community acquired pneumonia 04/26/2018   Acquired hypothyroidism 11/30/2017   Seronegative rheumatoid arthritis ([place]) 11/30/2017   Constipation 04/14/2017   Left anterior knee pain 11/03/2016   TIA (transient ischemic attack) 04/15/2016   Family history of breast cancer in first degree relative 03/10/2016   Osteoarthritis, shoulder 11/30/2015   Essential hypertension 11/30/2015   Vitamin D deficiency 11/30/2015   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     Family History  Problem Relation Age of Onset   Cancer Mother 48       breast ca   Cancer Maternal Aunt    Cancer Brother        pancreatic ca   Breast cancer Sister    Brain cancer Brother        astrocytoma?   Kidney cancer Sister     Social History   Tobacco Use   Smoking status: Never   Smokeless tobacco: Never  Vaping Use   Vaping Use: Never used  Substance Use Topics   Alcohol use: Yes    Comment: occ   Drug use: No    Home Medications Prior to Admission medications   Medication Sig Start Date End Date Taking? Authorizing Provider  amLODipine (NORVASC) 5 MG tablet Take 1 tablet (5 mg total) by mouth daily in the afternoon. 04/25/21 07/24/21  Adrian Prows, MD  aspirin EC 81 MG tablet Take 81 mg by mouth every evening.    [provider]  atorvastatin (LIPITOR) 10 MG tablet Take 1 tablet (10 mg total) by mouth daily. 04/25/21 07/24/21  Adrian Prows, MD  cholecalciferol (VITAMIN D) 1000 units tablet Take 1,000 Units by  mouth daily.    [provider]  denosumab (PROLIA) 60 MG/ML SOSY injection Inject 60 mg into the skin every 6 (six) months.    [provider]  diphenhydrAMINE (BENADRYL) 25 MG tablet Take 12.5 mg by mouth at bedtime as needed for sleep or allergies.    [provider]  folic acid (FOLVITE) 1 MG tablet Take 1 mg by mouth daily. 03/18/21   [provider]  isosorbide mononitrate (IMDUR) 30 MG 24 hr tablet Take 1 tablet (30 mg total) by mouth daily. 04/25/21 07/24/21  Adrian Prows, MD  levothyroxine (SYNTHROID) 100 MCG tablet TAKE ONE TABLET ONCE A DAY ON AN EMPTY STOMACH. Patient needs appointment to receive further refills. 06/05/20   Crecencio Mc, MD  losartan (COZAAR) 100 MG  tablet TAKE 1 TABLET ONCE DAILY. 10/25/19   Crecencio Mc, MD  Methotrexate Sodium (METHOTREXATE, PF,) 50 MG/2ML injection 0.70mL Injection once weekly 30 days 03/18/21   [provider]  metoprolol succinate (TOPROL-XL) 50 MG 24 hr tablet TAKE 1 TABLET ONCE DAILY IN THE EVENING. 11/08/19   Crecencio Mc, MD  nitroGLYCERIN (NITROSTAT) 0.4 MG SL tablet Place 1 tablet (0.4 mg total) under the tongue every 5 (five) minutes as needed for up to 25 days for chest pain. 04/25/21 05/20/21  Adrian Prows, MD  omeprazole (PRILOSEC) 40 MG capsule Take 40 mg by mouth every evening. 10/31/20   [provider]  predniSONE (DELTASONE) 1 MG tablet Take 3 mg by mouth daily. 05/12/17   [provider]    Allergies    Pollen extract and Levofloxacin  Review of Systems   Review of Systems  All other systems reviewed and are negative.  Physical Exam Updated Vital Signs BP (!) 128/56 (BP Location: Left Arm)   Pulse 66   Temp 98.4 F (36.9 C) (Oral)   Resp 17   SpO2 100%   Physical Exam Vitals and nursing note reviewed.  Constitutional:      General: She is not in acute distress.    Appearance: Normal appearance. She is well-developed. She is not toxic-appearing.  HENT:     Head: Normocephalic and atraumatic.  Eyes:     General: Lids are normal.     Conjunctiva/sclera: Conjunctivae normal.     Pupils: Pupils are equal, round, and reactive to light.  Neck:     Thyroid: No thyroid mass.     Trachea: No tracheal deviation.  Cardiovascular:     Rate and Rhythm: Normal rate and regular rhythm.     Heart sounds: Normal heart sounds. No murmur heard.   No gallop.  Pulmonary:     Effort: Pulmonary effort is normal. No respiratory distress.     Breath sounds: Normal breath sounds. No stridor. No decreased breath sounds, wheezing, rhonchi or rales.  Chest:    Abdominal:     General: There is no distension.     Palpations: Abdomen is soft.     Tenderness: There is no abdominal  tenderness. There is no rebound.  Musculoskeletal:        General: No tenderness. Normal range of motion.     Cervical back: Normal range of motion and neck supple.  Skin:    General: Skin is warm and dry.     Findings: No abrasion or rash.  Neurological:     Mental Status: She is alert and oriented to person, place, and time. Mental status is at baseline.     GCS: GCS eye  subscore is 4. GCS verbal subscore is 5. GCS motor subscore is 6.     Cranial Nerves: Cranial nerves are intact. No cranial nerve deficit.     Sensory: No sensory deficit.     Motor: Motor function is intact.  Psychiatric:        Attention and Perception: Attention normal.        Speech: Speech normal.        Behavior: Behavior normal.    ED Results / Procedures / Treatments   Labs (all labs ordered are listed, but only abnormal results are displayed) Labs Reviewed  BASIC METABOLIC PANEL - Abnormal; Notable for the following components:      Result Value   Sodium 128 (*)    Chloride 96 (*)    Glucose, Bld 108 (*)    All other components within normal limits  CBC - Abnormal; Notable for the following components:   Hemoglobin 11.5 (*)    HCT 35.3 (*)    All other components within normal limits  TROPONIN I (HIGH SENSITIVITY)  TROPONIN I (HIGH SENSITIVITY)    EKG EKG Interpretation  Date/Time:  Wednesday April 30 2021 16:57:22 EDT Ventricular Rate:  68 PR Interval:  212 QRS Duration: 126 QT Interval:  422 QTC Calculation: 448 R Axis:   -12 Text Interpretation: Sinus rhythm with 1st degree A-V block Non-specific intra-ventricular conduction block Minimal voltage criteria for LVH, may be normal variant ( Cornell product ) Possible Lateral infarct , age undetermined Abnormal ECG No significant change since last tracing Confirmed by Lacretia Leigh ([id number]) on 04/30/2021 6:55:56 PM  Radiology DG Chest 2 View  Result Date: 04/30/2021 CLINICAL DATA:  Left lateral chest pain for a couple hours. No relief from  sublingual nitroglycerin. EXAM: CHEST - 2 VIEW COMPARISON:  Radiographs 11/01/2020. FINDINGS: The heart size and mediastinal contours are stable with a moderate size hiatal hernia. There are mitral annular calcifications and atherosclerosis of the thoracic aorta. The lungs are clear. There is no pleural effusion or pneumothorax. Degenerative changes are present in the spine associated with a mild scoliosis. IMPRESSION: Stable chest.  No acute cardiopulmonary process. Electronically Signed   By: Richardean Sale M.D.   On: 04/30/2021 17:55    Procedures Procedures   Medications Ordered in ED Medications  acetaminophen (TYLENOL) tablet 650 mg (has no administration in time range)    ED Course  I have reviewed the triage vital signs and the nursing notes.  Pertinent labs & imaging results that were available during my care of the patient were reviewed by me and considered in my medical decision making (see chart for details).    MDM Rules/Calculators/A&P                          Patient with delta troponin x2 that was negative.  Given Tylenol and feels better.  Pain appears to be musculoskeletal in nature.  Chest x-ray without acute findings.  Sodium chronically low and unchanged.  Will discharge home Final Clinical Impression(s) / ED Diagnoses Final diagnoses:  None    Rx / DC Orders ED Discharge Orders     None        Lacretia Leigh, MD 04/30/21 2048

## 2021-05-01 DIAGNOSIS — E871 Hypo-osmolality and hyponatremia: Secondary | ICD-10-CM | POA: Diagnosis not present

## 2021-05-01 DIAGNOSIS — M810 Age-related osteoporosis without current pathological fracture: Secondary | ICD-10-CM | POA: Diagnosis not present

## 2021-05-01 DIAGNOSIS — M069 Rheumatoid arthritis, unspecified: Secondary | ICD-10-CM | POA: Diagnosis not present

## 2021-05-01 DIAGNOSIS — I10 Essential (primary) hypertension: Secondary | ICD-10-CM | POA: Diagnosis not present

## 2021-05-01 DIAGNOSIS — E039 Hypothyroidism, unspecified: Secondary | ICD-10-CM | POA: Diagnosis not present

## 2021-05-05 ENCOUNTER — Telehealth: Payer: Self-pay

## 2021-05-06 ENCOUNTER — Other Ambulatory Visit: Payer: Self-pay

## 2021-05-06 ENCOUNTER — Ambulatory Visit: Payer: Medicare Other | Admitting: Student

## 2021-05-06 ENCOUNTER — Encounter: Payer: Self-pay | Admitting: Student

## 2021-05-06 VITALS — BP 160/74 | HR 66 | Temp 98.2°F | Ht 62.5 in | Wt 140.0 lb

## 2021-05-06 DIAGNOSIS — I209 Angina pectoris, unspecified: Secondary | ICD-10-CM

## 2021-05-06 DIAGNOSIS — I10 Essential (primary) hypertension: Secondary | ICD-10-CM

## 2021-05-06 MED ORDER — ISOSORBIDE MONONITRATE ER 30 MG PO TB24
60.0000 mg | ORAL_TABLET | Freq: Every day | ORAL | 2 refills | Status: DC
Start: 2021-05-06 — End: 2021-05-16

## 2021-05-06 NOTE — Progress Notes (Signed)
Primary Physician/Referring:  [place], [place] @ [facility]  Patient ID: [name], female    DOB: [birth year], 87 y.o.   MRN: [id number]  Chief Complaint  Patient presents with   Arm Pain   Follow-up   Hospitalization Follow-up   Chest Pain   HPI:    [name]  is a 87 y.o. Caucasian female with history of hypertension, hypothyroidism, Rheumatoid arthritis.  She was originally referred to our office by PCP for evaluation of left bundle branch block and right arm pain.   Patient was seen in our office 04/25/2021 with symptoms concerning for typical angina pectoralis, however given her advanced age would like to try medical therapy first.  At last visit she was started on amlodipine 5 mg daily along with isosorbide mononitrate 30 mg daily.  She was evaluated in the emergency department 04/30/2021 with chest pain, evaluation was consistent with musculoskeletal in etiology and delta troponins negative x2, therefore patient was discharged home.  She then called our office yesterday with concerns of right arm pain and now presents for follow-up.  Patient presents with her daughter, who is a retired Marine scientist, at bedside. Patient is concerned as she has had intermittent right upper arm pain over the last several weeks while at rest. She has also noticed over the last 2 weeks that her blood pressure remains uncontrolled. She reports her symptoms are the same as what she was seen by Dr. Einar Gip for earlier this month. Denies chest pain, palpitations, syncope, near-syncope, dyspnea. Right arm pain improves with Tylenol.   Notably patient is also in the process of being evaluated by ortho for this complaint as well.   Past Medical History:  Diagnosis Date   Arthritis    Carpal tunnel syndrome    Gait abnormality    has for 5yr-no use of cane   Hx of TIA (transient ischemic attack) and stroke    Hypertension    Hypothyroidism    Osteoporosis    Wears glasses    Wears hearing aid     Past Surgical History:  Procedure Laterality Date   APPENDECTOMY     CARPAL TUNNEL RELEASE Left 07/11/2013   Procedure: CARPAL TUNNEL RELEASE;  Surgeon: Cammie Sickle., MD;  Location: [facility];  Service: Orthopedics;  Laterality: Left;   COLONOSCOPY     SUBDURAL HEMATOMA EVACUATION VIA CRANIOTOMY  2007   TONSILLECTOMY     Family History  Problem Relation Age of Onset   Cancer Mother 79       breast ca   Cancer Maternal Aunt    Cancer Brother        pancreatic ca   Breast cancer Sister    Brain cancer Brother        astrocytoma?   Kidney cancer Sister     Social History   Tobacco Use   Smoking status: Never   Smokeless tobacco: Never  Substance Use Topics   Alcohol use: Yes    Comment: occ   Marital Status: Widowed   ROS  Review of Systems  Cardiovascular:  Negative for chest pain (Right arm discomfort intermittent at rest.), dyspnea on exertion (When she walks uphill, chronic.) and leg swelling.  Musculoskeletal:  Positive for arthritis.  Gastrointestinal:  Negative for melena.   Objective  Blood pressure (!) 160/74, pulse 66, temperature 98.2 F (36.8 C), height 5' 2.5" (1.588 m), weight 140 lb (63.5 kg), SpO2 98 %.  Vitals with BMI 05/06/2021 04/30/2021  04/30/2021  Height 5' 2.5" - -  Weight 140 lbs - -  BMI 02.72 - -  Systolic 536 644 034  Diastolic 74 72 61  Pulse 66 67 71    Physical Exam Vitals reviewed.  HENT:     Head: Normocephalic and atraumatic.  Neck:     Vascular: No carotid bruit or JVD.  Cardiovascular:     Rate and Rhythm: Normal rate and regular rhythm.     Pulses: Intact distal pulses.     Heart sounds: Normal heart sounds. No murmur heard.   No gallop.  Pulmonary:     Effort: Pulmonary effort is normal.     Breath sounds: Normal breath sounds.  Abdominal:     General: Bowel sounds are normal.     Palpations: Abdomen is soft.  Musculoskeletal:        General: No swelling.  Neurological:     Mental Status: She  is alert.   Laboratory examination:   Recent Labs    12/10/20 0506 12/11/20 0507 04/30/21 1707  NA 127* 130* 128*  K 4.1 4.0 4.5  CL 97* 97* 96*  CO2 22 25 23   GLUCOSE 99 101* 108*  BUN 13 15 19   CREATININE 0.66 0.96 0.87  CALCIUM 8.1* 8.0* 9.1  GFRNONAA >60 56* >60   estimated creatinine clearance is 36.6 mL/min (by C-G formula based on SCr of 0.87 mg/dL).  CMP Latest Ref Rng & Units 04/30/2021 12/11/2020 12/10/2020  Glucose 70 - 99 mg/dL 108(H) 101(H) 99  BUN 8 - 23 mg/dL 19 15 13   Creatinine 0.44 - 1.00 mg/dL 0.87 0.96 0.66  Sodium 135 - 145 mmol/L 128(L) 130(L) 127(L)  Potassium 3.5 - 5.1 mmol/L 4.5 4.0 4.1  Chloride 98 - 111 mmol/L 96(L) 97(L) 97(L)  CO2 22 - 32 mmol/L 23 25 22   Calcium 8.9 - 10.3 mg/dL 9.1 8.0(L) 8.1(L)  Total Protein 6.0 - 8.3 g/dL - - -  Total Bilirubin 0.2 - 1.2 mg/dL - - -  Alkaline Phos 39 - 117 U/L - - -  AST 0 - 37 U/L - - -  ALT 0 - 35 U/L - - -   CBC Latest Ref Rng & Units 04/30/2021 12/11/2020 12/10/2020  WBC 4.0 - 10.5 K/uL 9.1 10.6(H) 12.6(H)  Hemoglobin 12.0 - 15.0 g/dL 11.5(L) 11.4(L) 11.9(L)  Hematocrit 36.0 - 46.0 % 35.3(L) 34.1(L) 35.9(L)  Platelets 150 - 400 K/uL 308 292 331  HEMOGLOBIN A1C Lab Results  Component Value Date   HGBA1C 5.8 09/13/2015   TSH Recent Labs    12/10/20 0506  TSH 1.783   External labs:   03/27/2021: Hgb 12.4, HCT 37.9, MCV 86.4, PLT 329 Glucose 85, BUN 19, creatinine 1.07, GFR 49, sodium 133, potassium 4.8, ALP 68, AST 17, ALT 16  03/13/2021: BUN 19, creatinine 0.88, GFR 62, sodium 136, potassium 4.7 TSH 0.68 Vitamin D 44.6 Total cholesterol 95, HDL 51, triglycerides 145, LDL 108  Allergies   Allergies  Allergen Reactions   Pollen Extract    Levofloxacin Anxiety    Agitation , nocturnal    Medications Prior to Visit:   Outpatient Medications Prior to Visit  Medication Sig Dispense Refill   amLODipine (NORVASC) 5 MG tablet Take 1 tablet (5 mg total) by mouth daily in the afternoon. 30  tablet 2   aspirin EC 81 MG tablet Take 81 mg by mouth every evening.     cholecalciferol (VITAMIN D) 1000 units tablet Take 1,000 Units by mouth daily.  denosumab (PROLIA) 60 MG/ML SOSY injection Inject 60 mg into the skin every 6 (six) months.     diphenhydrAMINE (BENADRYL) 25 MG tablet Take 12.5 mg by mouth at bedtime as needed for sleep or allergies.     folic acid (FOLVITE) 1 MG tablet Take 1 mg by mouth daily.     levothyroxine (SYNTHROID) 100 MCG tablet TAKE ONE TABLET ONCE A DAY ON AN EMPTY STOMACH. Patient needs appointment to receive further refills. 90 tablet 0   losartan (COZAAR) 100 MG tablet TAKE 1 TABLET ONCE DAILY. 90 tablet 0   Methotrexate Sodium (METHOTREXATE, PF,) 50 MG/2ML injection 0.38mL Injection once weekly 30 days     metoprolol succinate (TOPROL-XL) 50 MG 24 hr tablet TAKE 1 TABLET ONCE DAILY IN THE EVENING. 30 tablet 0   nitroGLYCERIN (NITROSTAT) 0.4 MG SL tablet Place 1 tablet (0.4 mg total) under the tongue every 5 (five) minutes as needed for up to 25 days for chest pain. 25 tablet 3   omeprazole (PRILOSEC) 40 MG capsule Take 40 mg by mouth every evening.     predniSONE (DELTASONE) 1 MG tablet Take 3 mg by mouth daily.     isosorbide mononitrate (IMDUR) 30 MG 24 hr tablet Take 1 tablet (30 mg total) by mouth daily. 30 tablet 2   atorvastatin (LIPITOR) 10 MG tablet Take 1 tablet (10 mg total) by mouth daily. (Patient not taking: Reported on 05/06/2021) 30 tablet 2   No facility-administered medications prior to visit.   Final Medications at End of Visit    Current Meds  Medication Sig   amLODipine (NORVASC) 5 MG tablet Take 1 tablet (5 mg total) by mouth daily in the afternoon.   aspirin EC 81 MG tablet Take 81 mg by mouth every evening.   cholecalciferol (VITAMIN D) 1000 units tablet Take 1,000 Units by mouth daily.   denosumab (PROLIA) 60 MG/ML SOSY injection Inject 60 mg into the skin every 6 (six) months.   diphenhydrAMINE (BENADRYL) 25 MG tablet Take 12.5  mg by mouth at bedtime as needed for sleep or allergies.   folic acid (FOLVITE) 1 MG tablet Take 1 mg by mouth daily.   levothyroxine (SYNTHROID) 100 MCG tablet TAKE ONE TABLET ONCE A DAY ON AN EMPTY STOMACH. Patient needs appointment to receive further refills.   losartan (COZAAR) 100 MG tablet TAKE 1 TABLET ONCE DAILY.   Methotrexate Sodium (METHOTREXATE, PF,) 50 MG/2ML injection 0.13mL Injection once weekly 30 days   metoprolol succinate (TOPROL-XL) 50 MG 24 hr tablet TAKE 1 TABLET ONCE DAILY IN THE EVENING.   nitroGLYCERIN (NITROSTAT) 0.4 MG SL tablet Place 1 tablet (0.4 mg total) under the tongue every 5 (five) minutes as needed for up to 25 days for chest pain.   omeprazole (PRILOSEC) 40 MG capsule Take 40 mg by mouth every evening.   predniSONE (DELTASONE) 1 MG tablet Take 3 mg by mouth daily.   [DISCONTINUED] isosorbide mononitrate (IMDUR) 30 MG 24 hr tablet Take 1 tablet (30 mg total) by mouth daily.     Radiology:   No results found.  Cardiac Studies:   Echocardiogram 04/15/2016:   - Left ventricle: The cavity size was normal. There was mild   concentric hypertrophy. Systolic function was normal. The   estimated ejection fraction was in the range of 60% to 65%. Wall  motion was normal; there were no regional wall motion   abnormalities. Doppler parameters are consistent with abnormal  left ventricular relaxation (grade 1 diastolic dysfunction). -  Aortic valve: There was mild regurgitation. - Mitral valve: There was mild regurgitation. - Pulmonary arteries: Systolic pressure was mildly increased. PA  peak pressure: 35 mm Hg (S).   Impressions: - No cardiac source of emboli was indentified.  Repeat echocardiogram pending  EKG:   05/06/2021: Sinus rhythm with first-degree AV block at a rate of 67 bpm.  Left bundle branch block, no further analysis. Unchanged compared to 04/25/2021.   04/25/2021: Sinus rhythm with first-degree AV block at rate of 66 bpm, left bundle branch block.   No further analysis.  Abnormal EKG.  Compared to 07/03/2010, first-degree AV block is new but otherwise no change.  No significant change from 07/03/2010.  Compared to 05/18/2009, normal sinus rhythm.  Assessment     ICD-10-CM   1. Essential hypertension  I10     2. Angina pectoris (HCC)  I20.9 EKG 12-Lead    isosorbide mononitrate (IMDUR) 30 MG 24 hr tablet       Medications Discontinued During This Encounter  Medication Reason   isosorbide mononitrate (IMDUR) 30 MG 24 hr tablet     Meds ordered this encounter  Medications   isosorbide mononitrate (IMDUR) 30 MG 24 hr tablet    Sig: Take 2 tablets (60 mg total) by mouth daily.    Dispense:  60 tablet    Refill:  2   Recommendations:   [name] is a 87 y.o. Caucasian female with history of hypertension, hypothyroidism, Rheumatoid arthritis.  She was originally referred to our office by PCP for evaluation of left bundle branch block and right arm pain. She has also had remote subdural hematoma evacuation by craniotomy in 2007 after an accidental injury, degenerative joint disease.  Patient was seen in our office 04/25/2021 with symptoms concerning for typical angina pectoralis, however given her advanced age would like to try medical therapy first.  At last visit she was started on amlodipine 5 mg daily along with isosorbide mononitrate 30 mg daily.  She was evaluated in the emergency department 04/30/2021 with chest pain, evaluation was consistent with musculoskeletal in etiology and delta troponins negative x2, therefore patient was discharged home.  She then called our office yesterday with concerns of right arm pain and now presents for follow-up.   Patient continues to have intermittent right arm pain. Her blood pressure remains uncontrolled. Will increase Imdur to 60 mg three times daily. Suspect there may be component of MSK etiology of patient right arm and right shoulder pain. Encouraged her to follow up with orthopedics as well.  In view of her advanced age will proceed with medical therapy first and if it fails would likely proceed to cath directly.   Patient will continue to monitor blood pressure and symptoms closely. Will await echocardiogram results. Will keep follow up appointment with Dr. Einar Gip as previously scheduled.    Alethia Berthold, PA-C 05/14/2021, 4:12 PM Office: [phone]

## 2021-05-09 ENCOUNTER — Telehealth: Payer: Self-pay

## 2021-05-09 DIAGNOSIS — I10 Essential (primary) hypertension: Secondary | ICD-10-CM

## 2021-05-09 MED ORDER — HYDRALAZINE HCL 25 MG PO TABS: 25.0000 mg | ORAL_TABLET | Freq: Three times a day (TID) | ORAL | 3 refills | Status: DC | PRN

## 2021-05-09 NOTE — Telephone Encounter (Signed)
ICD-10-CM   1. Essential hypertension  I10 hydrALAZINE (APRESOLINE) 25 MG tablet      Meds ordered this encounter  Medications   hydrALAZINE (APRESOLINE) 25 MG tablet    Sig: Take 1 tablet (25 mg total) by mouth 3 (three) times daily as needed (Take 1-2 tablets for SBP > 150 mm Hg.).    Dispense:  90 tablet    Refill:  3    Left a voicemail message on Margaret's cell phone.   Adrian Prows, MD, [facility] 05/09/2021, 4:56 PM Office: [phone] Fax: [phone] Pager: [phone]

## 2021-05-09 NOTE — Telephone Encounter (Signed)
Pts daughter called bp is still   147/88 HR 88 156/93 HR 62  [name] doubled her imdur on 05/06/21

## 2021-05-14 DIAGNOSIS — I10 Essential (primary) hypertension: Secondary | ICD-10-CM | POA: Diagnosis not present

## 2021-05-14 DIAGNOSIS — I209 Angina pectoris, unspecified: Secondary | ICD-10-CM | POA: Diagnosis not present

## 2021-05-16 ENCOUNTER — Other Ambulatory Visit: Payer: Self-pay

## 2021-05-16 DIAGNOSIS — I209 Angina pectoris, unspecified: Secondary | ICD-10-CM

## 2021-05-16 MED ORDER — ISOSORBIDE MONONITRATE ER 30 MG PO TB24: 60.0000 mg | ORAL_TABLET | Freq: Every day | ORAL | 2 refills | Status: DC

## 2021-05-20 NOTE — Telephone Encounter (Signed)
error 

## 2021-05-22 ENCOUNTER — Other Ambulatory Visit: Payer: Self-pay

## 2021-05-22 ENCOUNTER — Ambulatory Visit: Payer: Medicare Other

## 2021-05-22 DIAGNOSIS — I209 Angina pectoris, unspecified: Secondary | ICD-10-CM | POA: Diagnosis not present

## 2021-05-26 DIAGNOSIS — E559 Vitamin D deficiency, unspecified: Secondary | ICD-10-CM | POA: Diagnosis not present

## 2021-05-26 DIAGNOSIS — E871 Hypo-osmolality and hyponatremia: Secondary | ICD-10-CM | POA: Diagnosis not present

## 2021-05-26 DIAGNOSIS — I10 Essential (primary) hypertension: Secondary | ICD-10-CM | POA: Diagnosis not present

## 2021-05-29 DIAGNOSIS — E039 Hypothyroidism, unspecified: Secondary | ICD-10-CM | POA: Diagnosis not present

## 2021-05-29 DIAGNOSIS — M069 Rheumatoid arthritis, unspecified: Secondary | ICD-10-CM | POA: Diagnosis not present

## 2021-05-29 DIAGNOSIS — M810 Age-related osteoporosis without current pathological fracture: Secondary | ICD-10-CM | POA: Diagnosis not present

## 2021-05-29 DIAGNOSIS — I10 Essential (primary) hypertension: Secondary | ICD-10-CM | POA: Diagnosis not present

## 2021-06-02 ENCOUNTER — Other Ambulatory Visit: Payer: Medicare Other

## 2021-06-06 ENCOUNTER — Ambulatory Visit: Payer: Medicare Other | Admitting: Cardiology

## 2021-06-10 ENCOUNTER — Ambulatory Visit: Payer: Medicare Other | Admitting: Cardiology

## 2021-06-11 ENCOUNTER — Other Ambulatory Visit: Payer: Self-pay

## 2021-06-11 ENCOUNTER — Ambulatory Visit: Payer: Medicare Other | Admitting: Cardiology

## 2021-06-11 ENCOUNTER — Encounter: Payer: Self-pay | Admitting: Cardiology

## 2021-06-11 VITALS — BP 172/81 | HR 67 | Temp 97.8°F | Resp 16 | Ht 62.5 in | Wt 141.0 lb

## 2021-06-11 DIAGNOSIS — I10 Essential (primary) hypertension: Secondary | ICD-10-CM

## 2021-06-11 DIAGNOSIS — I447 Left bundle-branch block, unspecified: Secondary | ICD-10-CM | POA: Diagnosis not present

## 2021-06-11 DIAGNOSIS — E7800 Pure hypercholesterolemia, unspecified: Secondary | ICD-10-CM

## 2021-06-11 DIAGNOSIS — I209 Angina pectoris, unspecified: Secondary | ICD-10-CM | POA: Diagnosis not present

## 2021-06-11 MED ORDER — HYDRALAZINE HCL 25 MG PO TABS: 25.0000 mg | ORAL_TABLET | Freq: Three times a day (TID) | ORAL | 3 refills | Status: DC

## 2021-06-11 MED ORDER — ISOSORBIDE MONONITRATE ER 60 MG PO TB24: 60.0000 mg | ORAL_TABLET | Freq: Every day | ORAL | 3 refills | Status: DC

## 2021-06-11 NOTE — Progress Notes (Signed)
Primary Physician/Referring:  [place], [place] @ [facility]  Patient ID: [name], female    DOB: [birth year], 87 y.o.   MRN: [id number]  Chief Complaint  Patient presents with   Hypertension   Chest Pain   LBBB   Follow-up   HPI:    [name]  is a 87 y.o. Caucasian female with history of hypertension, hypothyroidism, Rheumatoid arthritis.  She was originally referred to our office by PCP for evaluation of left bundle branch block and right arm pain.   Patient was seen in our office 04/25/2021 with symptoms concerning for typical angina pectoralis, however given her advanced age would like to try medical therapy first.  At last visit she was started on amlodipine 5 mg daily along with isosorbide mononitrate 30 mg daily.  She was evaluated in the emergency department 04/30/2021 with chest pain, evaluation was consistent with musculoskeletal in etiology and delta troponins negative x2, therefore patient was discharged home.    She now presents for continued follow up. Her daughter, who is a retired Marine scientist, is present during the visit. The patient reports doing well overall with an overall decrease in the instances of her chest tightness radiating into her right arm. The patient has limited her physical activity recently until her chest pain workup has been completed. She would like guidance on returning to her 4 day per week exercise class.    The patient has been monitoring her BP at home once a day. She reports that her systolic BP stays around [id number].   Denies chest pain, palpitations, syncope, near-syncope, SOB, dyspnea, and falls. She does endorse some morning dizziness after starting her new medications but it has resolved.  For physical activity, the patient reports that she walked 1.5 miles one day this week.    The patient is waiting to receive ortho evaluation of her right arm via MRI until after her cardiac workup is complete.  Past Medical History:  Diagnosis  Date   Arthritis    Carpal tunnel syndrome    Gait abnormality    has for 51yrno use of cane   Hx of TIA (transient ischemic attack) and stroke    Hyperlipidemia    Hypertension    Hypothyroidism    Osteoporosis    Wears glasses    Wears hearing aid    Past Surgical History:  Procedure Laterality Date   APPENDECTOMY     CARPAL TUNNEL RELEASE Left 07/11/2013   Procedure: CARPAL TUNNEL RELEASE;  Surgeon: RCammie Sickle, MD;  Location: [facility]  Service: Orthopedics;  Laterality: Left;   COLONOSCOPY     SUBDURAL HEMATOMA EVACUATION VIA CRANIOTOMY  2007   TONSILLECTOMY     Family History  Problem Relation Age of Onset   Cancer Mother 932      breast ca   Heart attack Father    Other Father        Heavy smoker   Kidney cancer Sister    Breast cancer Sister    Breast cancer Sister    Melanoma Sister    Healthy Sister    Cancer Brother        pancreatic ca   Brain cancer Brother        astrocytoma?   Cancer Maternal Aunt     Social History   Tobacco Use   Smoking status: Never   Smokeless tobacco: Never  Substance Use Topics   Alcohol use: Yes  Alcohol/week: 1.0 standard drink    Types: 1 Glasses of wine per week    Comment: rarely   Marital Status: Widowed   ROS  Review of Systems  Cardiovascular:  Positive for dyspnea on exertion (When she walks uphill, chronic.). Negative for chest pain (Right arm discomfort intermittent at rest.), leg swelling, near-syncope, palpitations and syncope.  Respiratory:  Negative for shortness of breath and sleep disturbances due to breathing.   Musculoskeletal:  Positive for arthritis. Negative for falls.  Gastrointestinal:  Negative for melena.  Genitourinary:  Positive for nocturia.  Neurological:  Positive for light-headedness (briefly after beginning her new medications, resolved).   Objective  Blood pressure (!) 172/81, pulse 67, temperature 97.8 F (36.6 C), temperature source Temporal, resp. rate  16, height 5' 2.5" (1.588 m), weight 64 kg, SpO2 98 %.  Vitals with BMI 06/11/2021 05/06/2021 04/30/2021  Height 5' 2.5" 5' 2.5" -  Weight 141 lbs 140 lbs -  BMI [id number] 0000000 -  Systolic [id number] 0000000 0000000  Diastolic 81 74 72  Pulse 67 66 67    Physical Exam Vitals reviewed.  HENT:     Head: Normocephalic and atraumatic.  Neck:     Vascular: No carotid bruit or JVD.  Cardiovascular:     Rate and Rhythm: Normal rate and regular rhythm.     Pulses: Intact distal pulses.     Heart sounds: Murmur heard.  Systolic murmur is present with a grade of 2/6.    No gallop.  Pulmonary:     Effort: Pulmonary effort is normal.     Breath sounds: Normal breath sounds.  Abdominal:     General: Bowel sounds are normal.     Palpations: Abdomen is soft.  Musculoskeletal:        General: No swelling.  Neurological:     Mental Status: She is alert.   Laboratory examination:   Recent Labs    12/10/20 0506 12/11/20 0507 04/30/21 1707  NA 127* 130* 128*  K 4.1 4.0 4.5  CL 97* 97* 96*  CO2 '22 25 23  '$ GLUCOSE 99 101* 108*  BUN '13 15 19  '$ CREATININE 0.66 0.96 0.87  CALCIUM 8.1* 8.0* 9.1  GFRNONAA >60 56* >60   CrCl cannot be calculated (Patient's most recent lab result is older than the maximum 21 days allowed.).  CMP Latest Ref Rng & Units 04/30/2021 12/11/2020 12/10/2020  Glucose 70 - 99 mg/dL 108(H) 101(H) 99  BUN 8 - 23 mg/dL '19 15 13  '$ Creatinine 0.44 - 1.00 mg/dL 0.87 0.96 0.66  Sodium 135 - 145 mmol/L 128(L) 130(L) 127(L)  Potassium 3.5 - 5.1 mmol/L 4.5 4.0 4.1  Chloride 98 - 111 mmol/L 96(L) 97(L) 97(L)  CO2 22 - 32 mmol/L '23 25 22  '$ Calcium 8.9 - 10.3 mg/dL 9.1 8.0(L) 8.1(L)  Total Protein 6.0 - 8.3 g/dL - - -  Total Bilirubin 0.2 - 1.2 mg/dL - - -  Alkaline Phos 39 - 117 U/L - - -  AST 0 - 37 U/L - - -  ALT 0 - 35 U/L - - -   CBC Latest Ref Rng & Units 04/30/2021 12/11/2020 12/10/2020  WBC 4.0 - 10.5 K/uL 9.1 10.6(H) 12.6(H)  Hemoglobin 12.0 - 15.0 g/dL 11.5(L) 11.4(L) 11.9(L)  Hematocrit  36.0 - 46.0 % 35.3(L) 34.1(L) 35.9(L)  Platelets 150 - 400 K/uL 308 292 331  HEMOGLOBIN A1C Lab Results  Component Value Date   HGBA1C 5.8 09/13/2015   TSH Recent Labs  12/10/20 0506  TSH 1.783   Lipid Panel     Component Value Date/Time   CHOL 257 (H) 11/05/2016 1345   TRIG 146.0 11/05/2016 1345   HDL 55.90 11/05/2016 1345   CHOLHDL 5 11/05/2016 1345   VLDL 29.2 11/05/2016 1345   LDLCALC 172 (H) 11/05/2016 1345   LDLDIRECT 78.0 06/10/2016 0815    External labs:   03/27/2021: Hgb 12.4, HCT 37.9, MCV 86.4, PLT 329 Glucose 85, BUN 19, creatinine 1.07, GFR 49, sodium 133, potassium 4.8, ALP 68, AST 17, ALT 16  03/13/2021: BUN 19, creatinine 0.88, GFR 62, sodium 136, potassium 4.7 TSH 0.68 Vitamin D 44.6 Total cholesterol 95, HDL 51, triglycerides 145, LDL 108  Allergies   Allergies  Allergen Reactions   Pollen Extract    Levofloxacin Anxiety    Agitation , nocturnal    Medications Prior to Visit:   Outpatient Medications Prior to Visit  Medication Sig Dispense Refill   amLODipine (NORVASC) 5 MG tablet Take 1 tablet (5 mg total) by mouth daily in the afternoon. 30 tablet 2   aspirin EC 81 MG tablet Take 81 mg by mouth every evening.     atorvastatin (LIPITOR) 10 MG tablet Take 1 tablet (10 mg total) by mouth daily. 30 tablet 2   cholecalciferol (VITAMIN D) 1000 units tablet Take 1,000 Units by mouth daily.     denosumab (PROLIA) 60 MG/ML SOSY injection Inject 60 mg into the skin every 6 (six) months.     diphenhydrAMINE (BENADRYL) 25 MG tablet Take 12.5 mg by mouth at bedtime as needed for sleep or allergies.     folic acid (FOLVITE) 1 MG tablet Take 1 mg by mouth daily.     levothyroxine (SYNTHROID) 100 MCG tablet TAKE ONE TABLET ONCE A DAY ON AN EMPTY STOMACH. Patient needs appointment to receive further refills. 90 tablet 0   losartan (COZAAR) 100 MG tablet TAKE 1 TABLET ONCE DAILY. 90 tablet 0   Methotrexate Sodium (METHOTREXATE, PF,) 50 MG/2ML injection 0.56m  Injection once weekly 30 days     metoprolol succinate (TOPROL-XL) 50 MG 24 hr tablet TAKE 1 TABLET ONCE DAILY IN THE EVENING. 30 tablet 0   nitroGLYCERIN (NITROSTAT) 0.4 MG SL tablet Place 1 tablet (0.4 mg total) under the tongue every 5 (five) minutes as needed for up to 25 days for chest pain. 25 tablet 3   predniSONE (DELTASONE) 1 MG tablet Take 3 mg by mouth daily.     hydrALAZINE (APRESOLINE) 25 MG tablet Take 1 tablet (25 mg total) by mouth 3 (three) times daily as needed (Take 1-2 tablets for SBP > 150 mm Hg.). 90 tablet 3   isosorbide mononitrate (IMDUR) 30 MG 24 hr tablet Take 2 tablets (60 mg total) by mouth daily. 180 tablet 2   omeprazole (PRILOSEC) 40 MG capsule Take 40 mg by mouth every evening.     No facility-administered medications prior to visit.   Final Medications at End of Visit    Current Meds  Medication Sig   amLODipine (NORVASC) 5 MG tablet Take 1 tablet (5 mg total) by mouth daily in the afternoon.   aspirin EC 81 MG tablet Take 81 mg by mouth every evening.   atorvastatin (LIPITOR) 10 MG tablet Take 1 tablet (10 mg total) by mouth daily.   cholecalciferol (VITAMIN D) 1000 units tablet Take 1,000 Units by mouth daily.   denosumab (PROLIA) 60 MG/ML SOSY injection Inject 60 mg into the skin every 6 (six) months.  diphenhydrAMINE (BENADRYL) 25 MG tablet Take 12.5 mg by mouth at bedtime as needed for sleep or allergies.   folic acid (FOLVITE) 1 MG tablet Take 1 mg by mouth daily.   levothyroxine (SYNTHROID) 100 MCG tablet TAKE ONE TABLET ONCE A DAY ON AN EMPTY STOMACH. Patient needs appointment to receive further refills.   losartan (COZAAR) 100 MG tablet TAKE 1 TABLET ONCE DAILY.   Methotrexate Sodium (METHOTREXATE, PF,) 50 MG/2ML injection 0.38m Injection once weekly 30 days   metoprolol succinate (TOPROL-XL) 50 MG 24 hr tablet TAKE 1 TABLET ONCE DAILY IN THE EVENING.   nitroGLYCERIN (NITROSTAT) 0.4 MG SL tablet Place 1 tablet (0.4 mg total) under the tongue every  5 (five) minutes as needed for up to 25 days for chest pain.   predniSONE (DELTASONE) 1 MG tablet Take 3 mg by mouth daily.   [DISCONTINUED] hydrALAZINE (APRESOLINE) 25 MG tablet Take 1 tablet (25 mg total) by mouth 3 (three) times daily as needed (Take 1-2 tablets for SBP > 150 mm Hg.).   [DISCONTINUED] isosorbide mononitrate (IMDUR) 30 MG 24 hr tablet Take 2 tablets (60 mg total) by mouth daily.     Radiology:   No results found.  Cardiac Studies:   Echocardiogram 04/15/2016:   - Left ventricle: The cavity size was normal. There was mild   concentric hypertrophy. Systolic function was normal. The   estimated ejection fraction was in the range of 60% to 65%. Wall  motion was normal; there were no regional wall motion   abnormalities. Doppler parameters are consistent with abnormal  left ventricular relaxation (grade 1 diastolic dysfunction). - Aortic valve: There was mild regurgitation. - Mitral valve: There was mild regurgitation. - Pulmonary arteries: Systolic pressure was mildly increased. PA  peak pressure: 35 mm Hg (S).   Impressions: - No cardiac source of emboli was indentified.  Echocardiogram 05/22/2021 Left ventricle cavity is normal in size. Moderate concentric hypertrophy  of the left ventricle. Normal global wall motion. Normal LV systolic  function with EF 64%. Doppler evidence of grade I (impaired) diastolic  dysfunction, normal LAP.  Left atrial cavity is moderately dilated.  Trileaflet aortic valve. Mild aortic stenosis. Vmax 2 m/sec, mean PG 9  mmHg, AVA 1.5 cm2 by continuity equation. Moderate (Grade III) aortic  regurgitation.  Moderate mitral valve leaflet calcification. Moderate (Grade II) mitral  regurgitation.  Moderate tricuspid regurgitation. Estimated pulmonary artery systolic  pressure 41 mmHg.   EKG:   05/06/2021: Sinus rhythm with first-degree AV block at a rate of 67 bpm.  Left bundle branch block, no further analysis. Unchanged compared to  04/25/2021.   04/25/2021: Sinus rhythm with first-degree AV block at rate of 66 bpm, left bundle branch block.  No further analysis.  Abnormal EKG.  Compared to 07/03/2010, first-degree AV block is new but otherwise no change.  No significant change from 07/03/2010.  Compared to 05/18/2009, normal sinus rhythm.  Assessment     ICD-10-CM   1. Angina pectoris (HCC)  I20.9 isosorbide mononitrate (IMDUR) 60 MG 24 hr tablet    PCV MYOCARDIAL PERFUSION WITH LEXISCAN    2. Essential hypertension  I10 hydrALAZINE (APRESOLINE) 25 MG tablet    3. LBBB (left bundle branch block)  I44.7     4. Hypercholesteremia  E78.00        Medications Discontinued During This Encounter  Medication Reason   omeprazole (PRILOSEC) 40 MG capsule Error   hydrALAZINE (APRESOLINE) 25 MG tablet    isosorbide mononitrate (IMDUR) 30 MG  24 hr tablet     Meds ordered this encounter  Medications   isosorbide mononitrate (IMDUR) 60 MG 24 hr tablet    Sig: Take 1 tablet (60 mg total) by mouth daily.    Dispense:  90 tablet    Refill:  3    Dose increase   hydrALAZINE (APRESOLINE) 25 MG tablet    Sig: Take 1 tablet (25 mg total) by mouth 3 (three) times daily. Take 1 tab three times daily extra for SBP >150 mm Hg.    Dispense:  360 tablet    Refill:  3    Recommendations:   Simrah Burdsall Saah is a 87 y.o. Caucasian female with history of hypertension, hypothyroidism, Rheumatoid arthritis.  She was originally referred to our office by PCP for evaluation of left bundle branch block and right arm pain. She has also had remote subdural hematoma evacuation by craniotomy in 2007 after an accidental injury, degenerative joint disease.  Patient was seen in our office 04/25/2021 with symptoms concerning for typical angina pectoralis, however given her advanced age would like to try medical therapy first. She was evaluated in the emergency department 04/30/2021 with chest pain, evaluation was consistent with musculoskeletal in etiology and  delta troponins negative x2, therefore patient was discharged home. Since first being seen at this office, she has been started on amlodipine 5 mg daily, isosorbide mononitrate 30 mg twice a day,  and hydralazine 25 mg three times daily as needed to treat her suspected angina pectoris and uncontrolled hypertension.   The patient is doing better overall since starting medical therapy, but is still having occasional chest tightness and right arm pain. Will order a nuclear stress test to further evaluate the suspected angina pectoris. The patient was educated about the procedure and agreed to undergo the stress testing. Will go over the results at the patient's next office visit. The patient was also encouraged to restart her exercise classes so that her symptoms can be further evaluated for any correlation with exercise. Exercise will also benefit her overall cardiac health.  In view of her advanced age will proceed with medical therapy first for her suspected angina and if it fails, would likely proceed to cath directly.   The patient's BP remains uncontrolled despite the addition of new medications. Suspect that this may also be a contributing factor to the patient's chest pain and right arm pain. The patient was encouraged to check her BP several times per day. Medication changes today include consolidating the patient's Imdur to 60 mg once per day instead of 30 mg twice a day. The patient was instructed to call us when refills are needed. Hydralazine was also increased to 1 tablet (25 mg total) by mouth 3 (three) times daily. The patient was instructed to take 1 tab three times daily extra for SBP readings >150 mm Hg. The patient and her daughter voiced understanding.   Patient will continue to monitor blood pressure and symptoms closely.   Follow up in 6 weeks for angina pectoris, HTN, and stress test results.    [place], PA-S 06/11/2021, 9:48 AM Office: [phone]

## 2021-06-19 IMAGING — CR DG CHEST 2V
1 series · 2 of 2 positions shown · non-contrast
Comparison: 05/24/2018

CLINICAL DATA: [AGE] female with cough

EXAM:
CHEST - 2 VIEW

[Series 1: dg chest 2 view · 0.14mm/px · 2 of 2 slices shown]
[im 1/2]
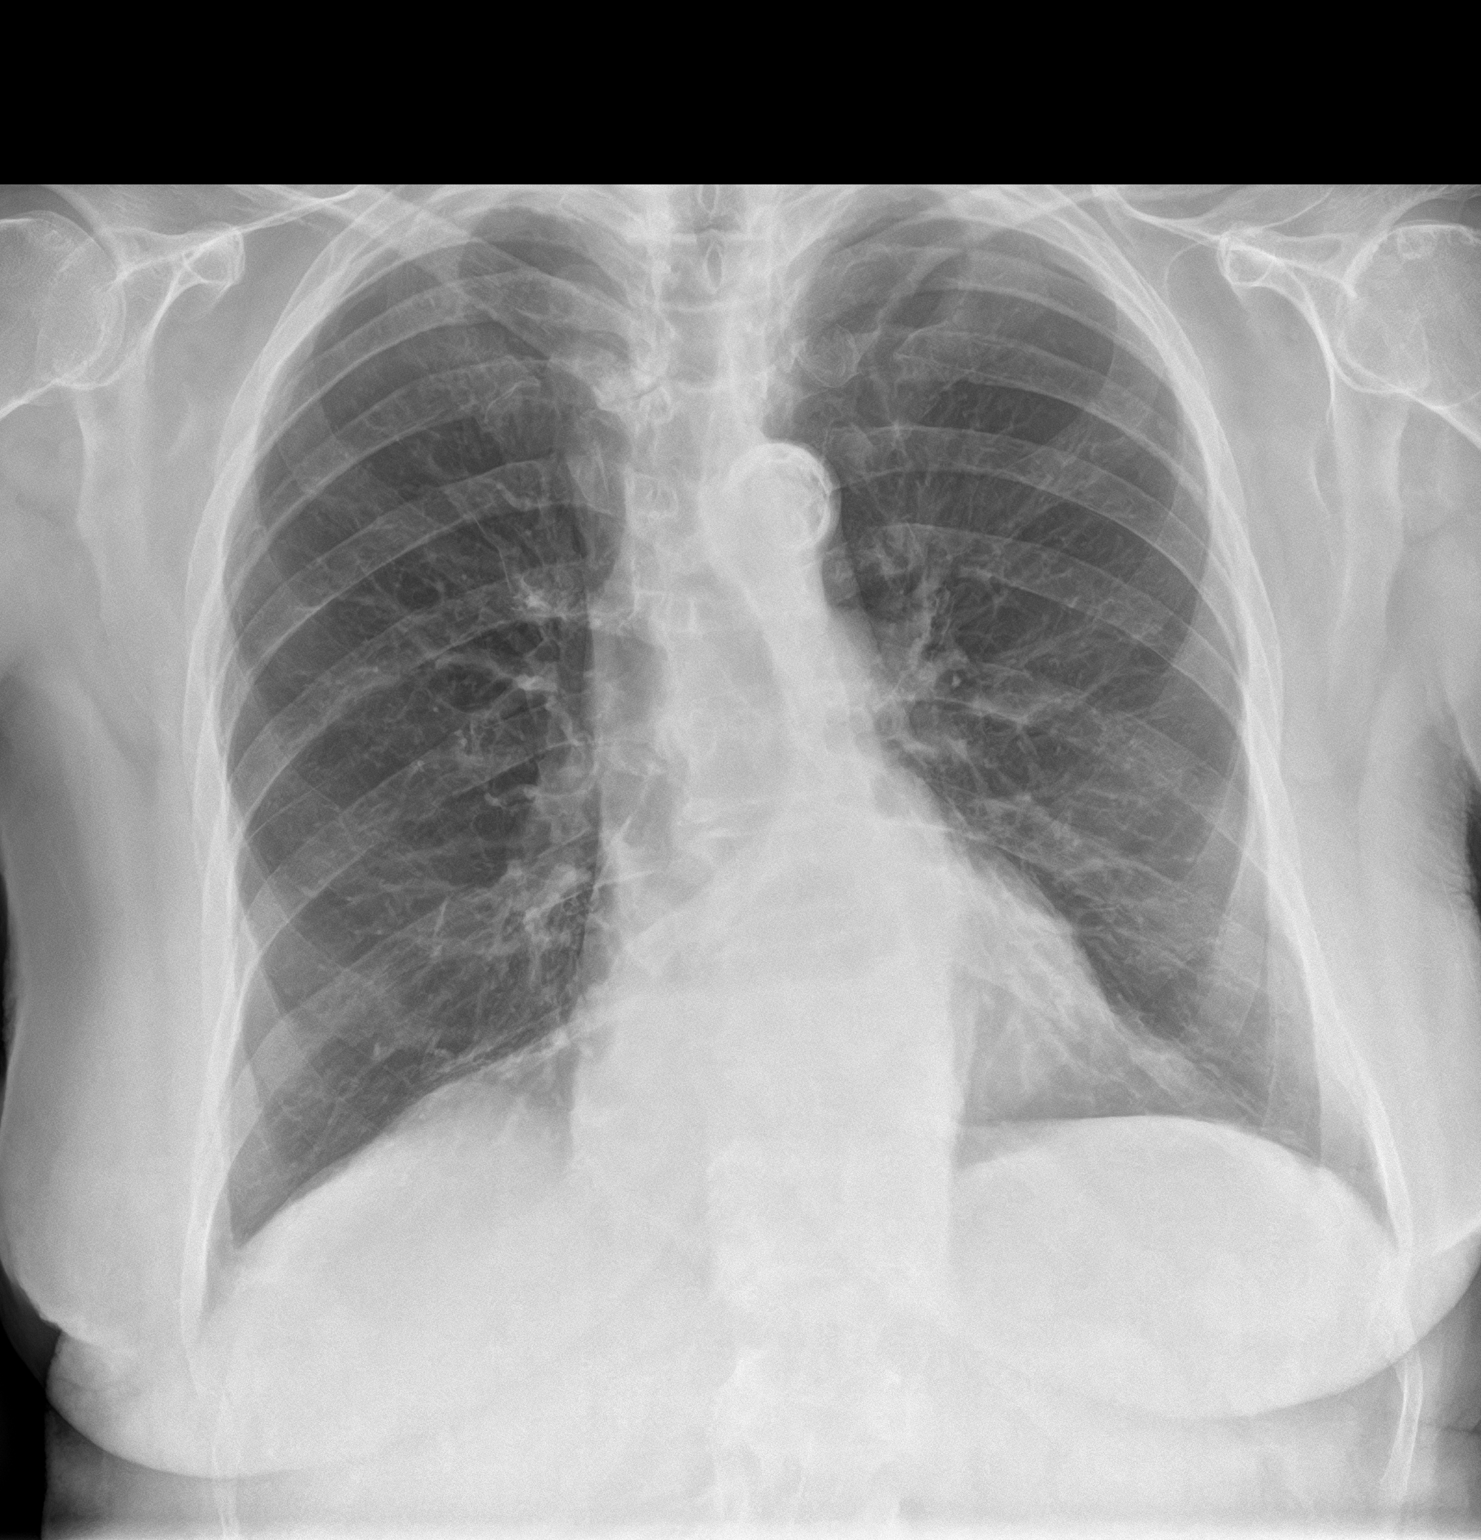
[im 2/2]
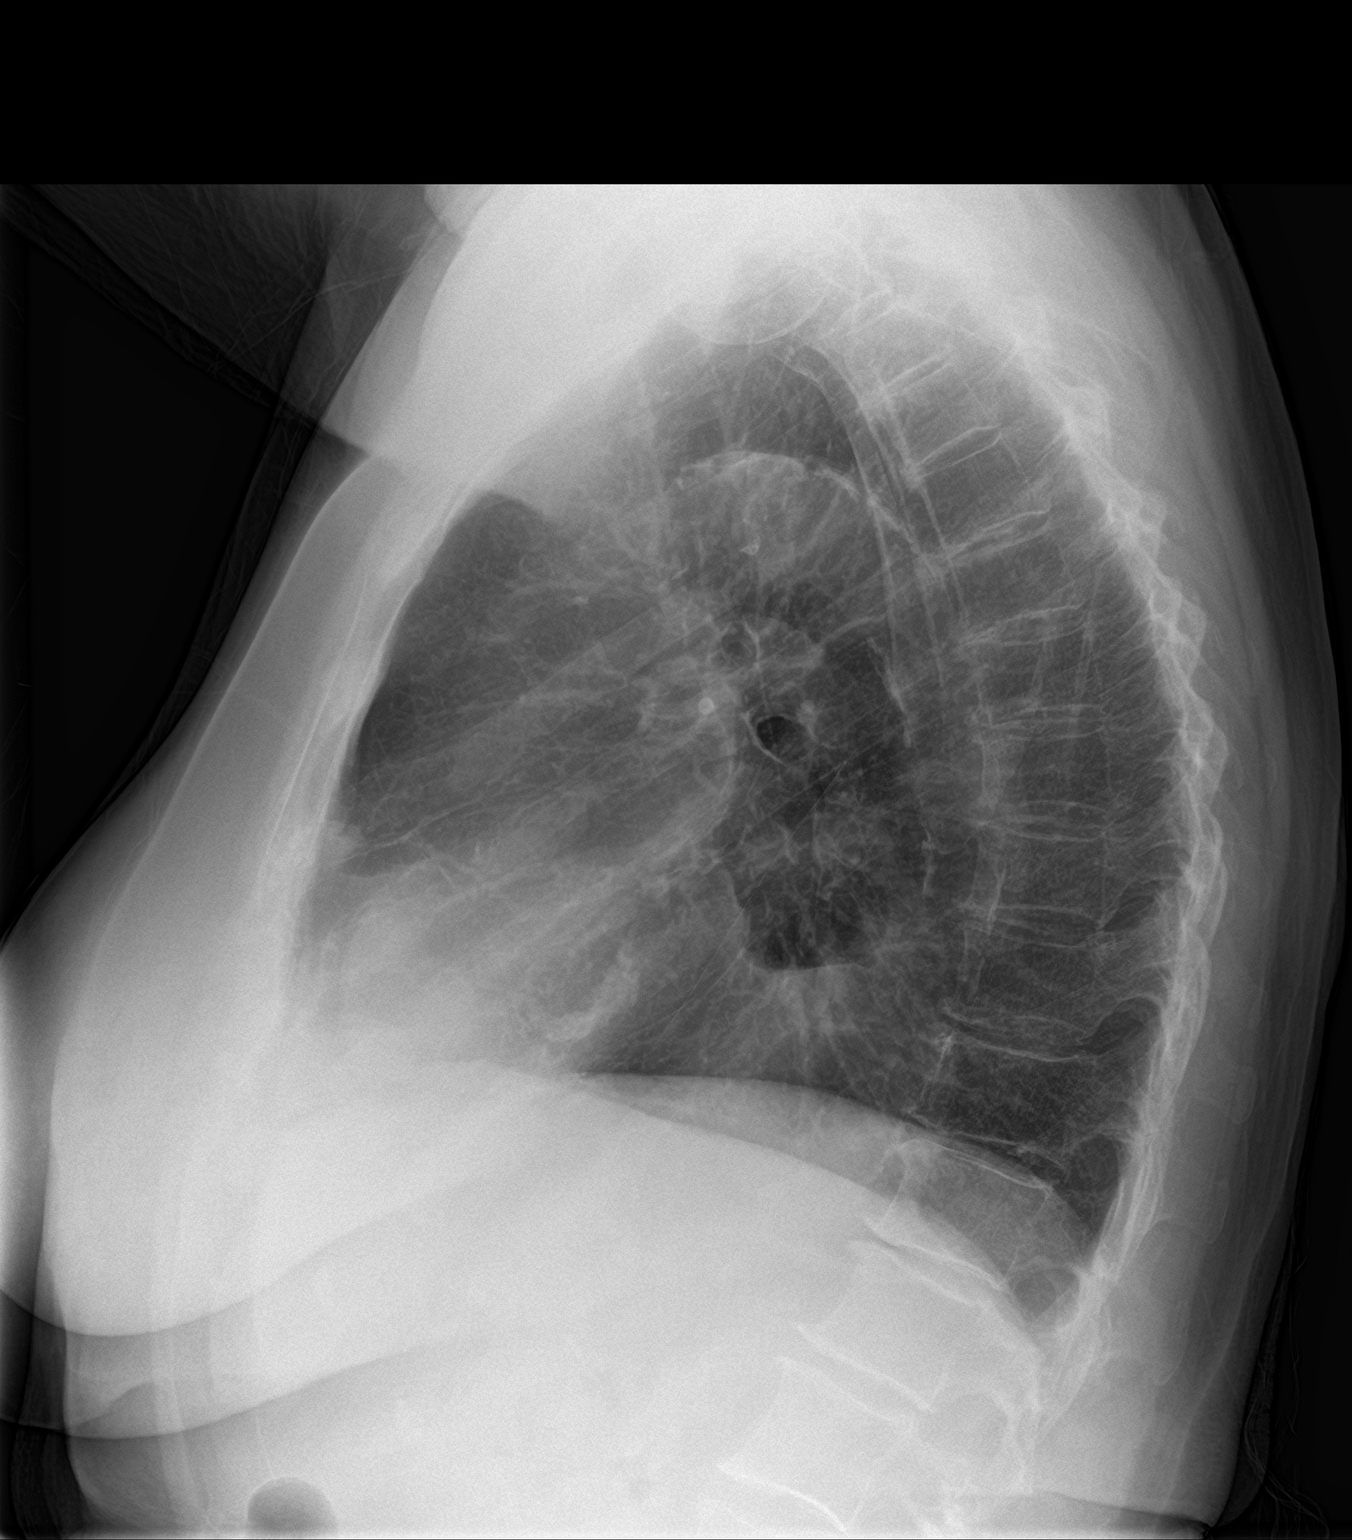

[2 of 2 positions shown; findings below may reference images not displayed]

FINDINGS: Cardiomediastinal silhouette unchanged in size and contour. Double
density with air-fluid level of the lower mediastinum is unchanged.

No pneumothorax. No pleural effusion. No confluent airspace disease.
Mitral annular calcifications.

Degenerative changes of the spine.
IMPRESSION: Negative for acute cardiopulmonary disease.

Hiatal hernia.

Mitral annular calcifications

## 2021-06-26 DIAGNOSIS — M0600 Rheumatoid arthritis without rheumatoid factor, unspecified site: Secondary | ICD-10-CM | POA: Diagnosis not present

## 2021-06-26 DIAGNOSIS — M25552 Pain in left hip: Secondary | ICD-10-CM | POA: Diagnosis not present

## 2021-06-26 DIAGNOSIS — Z7952 Long term (current) use of systemic steroids: Secondary | ICD-10-CM | POA: Diagnosis not present

## 2021-06-26 DIAGNOSIS — E663 Overweight: Secondary | ICD-10-CM | POA: Diagnosis not present

## 2021-06-26 DIAGNOSIS — Z6825 Body mass index (BMI) 25.0-25.9, adult: Secondary | ICD-10-CM | POA: Diagnosis not present

## 2021-06-26 DIAGNOSIS — E559 Vitamin D deficiency, unspecified: Secondary | ICD-10-CM | POA: Diagnosis not present

## 2021-06-26 DIAGNOSIS — M810 Age-related osteoporosis without current pathological fracture: Secondary | ICD-10-CM | POA: Diagnosis not present

## 2021-06-26 DIAGNOSIS — Z840 Family history of diseases of the skin and subcutaneous tissue: Secondary | ICD-10-CM | POA: Diagnosis not present

## 2021-06-26 DIAGNOSIS — Z8739 Personal history of other diseases of the musculoskeletal system and connective tissue: Secondary | ICD-10-CM | POA: Diagnosis not present

## 2021-06-26 DIAGNOSIS — M542 Cervicalgia: Secondary | ICD-10-CM | POA: Diagnosis not present

## 2021-06-26 DIAGNOSIS — M150 Primary generalized (osteo)arthritis: Secondary | ICD-10-CM | POA: Diagnosis not present

## 2021-07-01 ENCOUNTER — Ambulatory Visit: Payer: Medicare Other

## 2021-07-01 ENCOUNTER — Other Ambulatory Visit: Payer: Self-pay

## 2021-07-01 DIAGNOSIS — I209 Angina pectoris, unspecified: Secondary | ICD-10-CM

## 2021-07-07 ENCOUNTER — Other Ambulatory Visit: Payer: Medicare Other

## 2021-07-07 DIAGNOSIS — Z23 Encounter for immunization: Secondary | ICD-10-CM | POA: Diagnosis not present

## 2021-07-16 DIAGNOSIS — E039 Hypothyroidism, unspecified: Secondary | ICD-10-CM | POA: Diagnosis not present

## 2021-07-16 DIAGNOSIS — K219 Gastro-esophageal reflux disease without esophagitis: Secondary | ICD-10-CM | POA: Diagnosis not present

## 2021-07-16 DIAGNOSIS — M810 Age-related osteoporosis without current pathological fracture: Secondary | ICD-10-CM | POA: Diagnosis not present

## 2021-07-16 DIAGNOSIS — I10 Essential (primary) hypertension: Secondary | ICD-10-CM | POA: Diagnosis not present

## 2021-07-16 DIAGNOSIS — M069 Rheumatoid arthritis, unspecified: Secondary | ICD-10-CM | POA: Diagnosis not present

## 2021-07-16 DIAGNOSIS — N1831 Chronic kidney disease, stage 3a: Secondary | ICD-10-CM | POA: Diagnosis not present

## 2021-07-17 DIAGNOSIS — K219 Gastro-esophageal reflux disease without esophagitis: Secondary | ICD-10-CM | POA: Diagnosis not present

## 2021-07-17 DIAGNOSIS — K5904 Chronic idiopathic constipation: Secondary | ICD-10-CM | POA: Diagnosis not present

## 2021-07-22 ENCOUNTER — Other Ambulatory Visit: Payer: Self-pay

## 2021-07-22 ENCOUNTER — Ambulatory Visit: Payer: Medicare Other | Admitting: Cardiology

## 2021-07-22 ENCOUNTER — Encounter: Payer: Self-pay | Admitting: Cardiology

## 2021-07-22 VITALS — BP 120/70 | HR 68 | Temp 98.6°F | Resp 17 | Ht 62.5 in | Wt 143.4 lb

## 2021-07-22 DIAGNOSIS — I209 Angina pectoris, unspecified: Secondary | ICD-10-CM | POA: Diagnosis not present

## 2021-07-22 DIAGNOSIS — E7800 Pure hypercholesterolemia, unspecified: Secondary | ICD-10-CM

## 2021-07-22 DIAGNOSIS — I447 Left bundle-branch block, unspecified: Secondary | ICD-10-CM | POA: Diagnosis not present

## 2021-07-22 DIAGNOSIS — I10 Essential (primary) hypertension: Secondary | ICD-10-CM

## 2021-07-22 DIAGNOSIS — R0789 Other chest pain: Secondary | ICD-10-CM | POA: Diagnosis not present

## 2021-07-22 NOTE — Progress Notes (Signed)
Primary Physician/Referring:  [place], [place] @ [facility]  Patient ID: [name], female    DOB: 02-Aug-1927, 87 y.o.   MRN: [id number]  Chief Complaint  Patient presents with   Chest Pain   Hypertension    6 WEEKS   HPI:    [name]  is a 87 y.o. Caucasian female with history of hypertension, hypothyroidism, Rheumatoid arthritis, left bundle branch block and right arm pain. She has also had remote subdural hematoma evacuation by craniotomy in 2007 after an accidental injury, degenerative joint disease.  She was last evaluated in the emergency room in July 2022 for chest pain and left arm pain and was started on amlodipine and isosorbide mononitrate.  Because of persistent symptoms I had ordered a nuclear stress test and also increased hydralazine to 3 times daily dosing for hypertension, she now presents for follow-up.  States that she has not had any further left arm pain.  She was scheduled for orthopedic evaluation and MRI.  Also brings in home blood pressure recordings which are under excellent control.  Past Medical History:  Diagnosis Date   Arthritis    Carpal tunnel syndrome    Gait abnormality    has for 67yr-no use of cane   Hx of TIA (transient ischemic attack) and stroke    Hyperlipidemia    Hypertension    Hypothyroidism    Osteoporosis    Wears glasses    Wears hearing aid    Past Surgical History:  Procedure Laterality Date   APPENDECTOMY     CARPAL TUNNEL RELEASE Left 07/11/2013   Procedure: CARPAL TUNNEL RELEASE;  Surgeon: Cammie Sickle., MD;  Location: [facility];  Service: Orthopedics;  Laterality: Left;   COLONOSCOPY     SUBDURAL HEMATOMA EVACUATION VIA CRANIOTOMY  2007   TONSILLECTOMY     Family History  Problem Relation Age of Onset   Cancer Mother 90       breast ca   Heart attack Father    Other Father        Heavy smoker   Kidney cancer Sister    Breast cancer Sister    Breast cancer Sister     Melanoma Sister    Healthy Sister    Cancer Brother        pancreatic ca   Brain cancer Brother        astrocytoma?   Cancer Maternal Aunt     Social History   Tobacco Use   Smoking status: Never   Smokeless tobacco: Never  Substance Use Topics   Alcohol use: Yes    Alcohol/week: 1.0 standard drink    Types: 1 Glasses of wine per week    Comment: rarely   Marital Status: Widowed   ROS  Review of Systems  Cardiovascular:  Positive for dyspnea on exertion (When she walks uphill, chronic.). Negative for chest pain, leg swelling, near-syncope, palpitations and syncope.  Respiratory:  Negative for shortness of breath and sleep disturbances due to breathing.   Musculoskeletal:  Positive for arthritis. Negative for falls.  Gastrointestinal:  Negative for melena.  Genitourinary:  Positive for nocturia.  Neurological:  Positive for light-headedness (briefly after beginning her new medications, resolved).   Objective  Blood pressure 120/70, pulse 68, temperature 98.6 F (37 C), temperature source Temporal, resp. rate 17, height 5' 2.5" (1.588 m), weight 143 lb 6.4 oz (65 kg), SpO2 99 %.  Vitals with BMI 07/22/2021 07/22/2021 06/11/2021  Height -  5' 2.5" 5' 2.5"  Weight - 143 lbs 6 oz 141 lbs  BMI - 93.71 69.67  Systolic 893 810 175  Diastolic 70 87 81  Pulse 68 70 67    Physical Exam Vitals reviewed.  HENT:     Head: Normocephalic and atraumatic.  Neck:     Vascular: No carotid bruit or JVD.  Cardiovascular:     Rate and Rhythm: Normal rate and regular rhythm.     Pulses: Intact distal pulses.     Heart sounds: Murmur heard.  Systolic murmur is present with a grade of 2/6.    No gallop.  Pulmonary:     Effort: Pulmonary effort is normal.     Breath sounds: Normal breath sounds.  Abdominal:     General: Bowel sounds are normal.     Palpations: Abdomen is soft.  Musculoskeletal:        General: No swelling.  Neurological:     Mental Status: She is alert.   Laboratory  examination:   Recent Labs    12/10/20 0506 12/11/20 0507 04/30/21 1707  NA 127* 130* 128*  K 4.1 4.0 4.5  CL 97* 97* 96*  CO2 22 25 23   GLUCOSE 99 101* 108*  BUN 13 15 19   CREATININE 0.66 0.96 0.87  CALCIUM 8.1* 8.0* 9.1  GFRNONAA >60 56* >60   CrCl cannot be calculated (Patient's most recent lab result is older than the maximum 21 days allowed.).  CMP Latest Ref Rng & Units 04/30/2021 12/11/2020 12/10/2020  Glucose 70 - 99 mg/dL 108(H) 101(H) 99  BUN 8 - 23 mg/dL 19 15 13   Creatinine 0.44 - 1.00 mg/dL 0.87 0.96 0.66  Sodium 135 - 145 mmol/L 128(L) 130(L) 127(L)  Potassium 3.5 - 5.1 mmol/L 4.5 4.0 4.1  Chloride 98 - 111 mmol/L 96(L) 97(L) 97(L)  CO2 22 - 32 mmol/L 23 25 22   Calcium 8.9 - 10.3 mg/dL 9.1 8.0(L) 8.1(L)  Total Protein 6.0 - 8.3 g/dL - - -  Total Bilirubin 0.2 - 1.2 mg/dL - - -  Alkaline Phos 39 - 117 U/L - - -  AST 0 - 37 U/L - - -  ALT 0 - 35 U/L - - -   CBC Latest Ref Rng & Units 04/30/2021 12/11/2020 12/10/2020  WBC 4.0 - 10.5 K/uL 9.1 10.6(H) 12.6(H)  Hemoglobin 12.0 - 15.0 g/dL 11.5(L) 11.4(L) 11.9(L)  Hematocrit 36.0 - 46.0 % 35.3(L) 34.1(L) 35.9(L)  Platelets 150 - 400 K/uL 308 292 331  HEMOGLOBIN A1C Lab Results  Component Value Date   HGBA1C 5.8 09/13/2015   TSH Recent Labs    12/10/20 0506  TSH 1.783   Lipid Panel     Component Value Date/Time   CHOL 257 (H) 11/05/2016 1345   TRIG 146.0 11/05/2016 1345   HDL 55.90 11/05/2016 1345   CHOLHDL 5 11/05/2016 1345   VLDL 29.2 11/05/2016 1345   LDLCALC 172 (H) 11/05/2016 1345   LDLDIRECT 78.0 06/10/2016 0815    External labs:   03/27/2021: Hgb 12.4, HCT 37.9, MCV 86.4, PLT 329 Glucose 85, BUN 19, creatinine 1.07, GFR 49, sodium 133, potassium 4.8, ALP 68, AST 17, ALT 16  03/13/2021: BUN 19, creatinine 0.88, GFR 62, sodium 136, potassium 4.7 TSH 0.68 Vitamin D 44.6 Total cholesterol 95, HDL 51, triglycerides 145, LDL 108  Allergies   Allergies  Allergen Reactions   Pollen Extract     Levofloxacin Anxiety    Agitation , nocturnal    Medications Prior to  Visit:   Outpatient Medications Prior to Visit  Medication Sig Dispense Refill   acetaminophen (TYLENOL) 500 MG tablet Take 500 mg by mouth every 6 (six) hours as needed.     amLODipine (NORVASC) 5 MG tablet Take 1 tablet (5 mg total) by mouth daily in the afternoon. 30 tablet 2   aspirin EC 81 MG tablet Take 81 mg by mouth every evening.     atorvastatin (LIPITOR) 10 MG tablet Take 1 tablet (10 mg total) by mouth daily. 30 tablet 2   cholecalciferol (VITAMIN D) 1000 units tablet Take 1,000 Units by mouth daily.     denosumab (PROLIA) 60 MG/ML SOSY injection Inject 60 mg into the skin every 6 (six) months.     diphenhydrAMINE (BENADRYL) 25 MG tablet Take 12.5 mg by mouth at bedtime as needed for sleep or allergies.     folic acid (FOLVITE) 1 MG tablet Take 1 mg by mouth daily.     hydrALAZINE (APRESOLINE) 25 MG tablet Take 1 tablet (25 mg total) by mouth 3 (three) times daily. Take 1 tab three times daily extra for SBP >150 mm Hg. 360 tablet 3   isosorbide mononitrate (IMDUR) 60 MG 24 hr tablet Take 1 tablet (60 mg total) by mouth daily. 90 tablet 3   levothyroxine (SYNTHROID) 100 MCG tablet TAKE ONE TABLET ONCE A DAY ON AN EMPTY STOMACH. Patient needs appointment to receive further refills. 90 tablet 0   losartan (COZAAR) 100 MG tablet TAKE 1 TABLET ONCE DAILY. 90 tablet 0   Methotrexate Sodium (METHOTREXATE, PF,) 50 MG/2ML injection 0.98mL Injection once weekly 30 days     Methylcellulose, Laxative, (CITRUCEL PO) Take by mouth.     metoprolol succinate (TOPROL-XL) 50 MG 24 hr tablet TAKE 1 TABLET ONCE DAILY IN THE EVENING. 30 tablet 0   nitroGLYCERIN (NITROSTAT) 0.4 MG SL tablet Place 1 tablet under the tongue as needed.     omeprazole (PRILOSEC) 40 MG capsule Take 1 capsule by mouth daily.     predniSONE (DELTASONE) 1 MG tablet Take 3 mg by mouth daily.     nitroGLYCERIN (NITROSTAT) 0.4 MG SL tablet Place 1 tablet (0.4  mg total) under the tongue every 5 (five) minutes as needed for up to 25 days for chest pain. 25 tablet 3   No facility-administered medications prior to visit.   Final Medications at End of Visit    Current Meds  Medication Sig   acetaminophen (TYLENOL) 500 MG tablet Take 500 mg by mouth every 6 (six) hours as needed.   amLODipine (NORVASC) 5 MG tablet Take 1 tablet (5 mg total) by mouth daily in the afternoon.   aspirin EC 81 MG tablet Take 81 mg by mouth every evening.   atorvastatin (LIPITOR) 10 MG tablet Take 1 tablet (10 mg total) by mouth daily.   cholecalciferol (VITAMIN D) 1000 units tablet Take 1,000 Units by mouth daily.   denosumab (PROLIA) 60 MG/ML SOSY injection Inject 60 mg into the skin every 6 (six) months.   diphenhydrAMINE (BENADRYL) 25 MG tablet Take 12.5 mg by mouth at bedtime as needed for sleep or allergies.   folic acid (FOLVITE) 1 MG tablet Take 1 mg by mouth daily.   hydrALAZINE (APRESOLINE) 25 MG tablet Take 1 tablet (25 mg total) by mouth 3 (three) times daily. Take 1 tab three times daily extra for SBP >150 mm Hg.   isosorbide mononitrate (IMDUR) 60 MG 24 hr tablet Take 1 tablet (60 mg total) by mouth  daily.   levothyroxine (SYNTHROID) 100 MCG tablet TAKE ONE TABLET ONCE A DAY ON AN EMPTY STOMACH. Patient needs appointment to receive further refills.   losartan (COZAAR) 100 MG tablet TAKE 1 TABLET ONCE DAILY.   Methotrexate Sodium (METHOTREXATE, PF,) 50 MG/2ML injection 0.42mL Injection once weekly 30 days   Methylcellulose, Laxative, (CITRUCEL PO) Take by mouth.   metoprolol succinate (TOPROL-XL) 50 MG 24 hr tablet TAKE 1 TABLET ONCE DAILY IN THE EVENING.   nitroGLYCERIN (NITROSTAT) 0.4 MG SL tablet Place 1 tablet under the tongue as needed.   omeprazole (PRILOSEC) 40 MG capsule Take 1 capsule by mouth daily.   predniSONE (DELTASONE) 1 MG tablet Take 3 mg by mouth daily.    Radiology:   No results found.  Cardiac Studies:   Echocardiogram  05/22/2021 Left ventricle cavity is normal in size. Moderate concentric hypertrophy  of the left ventricle. Normal global wall motion. Normal LV systolic  function with EF 64%. Doppler evidence of grade I (impaired) diastolic  dysfunction, normal LAP.  Left atrial cavity is moderately dilated.  Trileaflet aortic valve. Mild aortic stenosis. Vmax 2 m/sec, mean PG 9  mmHg, AVA 1.5 cm2 by continuity equation. Moderate (Grade III) aortic  regurgitation.  Moderate mitral valve leaflet calcification. Moderate (Grade II) mitral  regurgitation.  Moderate tricuspid regurgitation. Estimated pulmonary artery systolic  pressure 41 mmHg.   PCV MYOCARDIAL PERFUSION WITH LEXISCAN 07/01/2021  Narrative Lexiscan Tetrofosmin stress test 07/01/2021: 1 Day Rest/Stress Protocol. Stress EKG is non-diagnostic, as this is pharmacological stress test using Lexiscan. Normal myocardial perfusion without convincing evidence of reversible myocardial ischemia or prior infarct. Left ventricular wall motion and thickness are preserved. Calculated LVEF 63%, visually appears hyperdynamic. Low risk study. No prior studies available for comparison.    EKG:   05/06/2021: Sinus rhythm with first-degree AV block at a rate of 67 bpm.  Left bundle branch block, no further analysis. Unchanged compared to 04/25/2021.   04/25/2021: Sinus rhythm with first-degree AV block at rate of 66 bpm, left bundle branch block.  No further analysis.  Abnormal EKG.  Compared to 07/03/2010, first-degree AV block is new but otherwise no change.  No significant change from 07/03/2010.  Compared to 05/18/2009, normal sinus rhythm.  Assessment     ICD-10-CM   1. Musculoskeletal chest pain  R07.89     2. LBBB (left bundle branch block)  I44.7     3. Essential hypertension  I10 EKG 12-Lead    4. Hypercholesteremia  E78.00        Medications Discontinued During This Encounter  Medication Reason   nitroGLYCERIN (NITROSTAT) 0.4 MG SL tablet  Error    No orders of the defined types were placed in this encounter.   Recommendations:   [name] is a 87 y.o. Caucasian female with history of hypertension, hypothyroidism, Rheumatoid arthritis, left bundle branch block and right arm pain. She has also had remote subdural hematoma evacuation by craniotomy in 2007 after an accidental injury, degenerative joint disease.  She was last evaluated in the emergency room in July 2022 for chest pain and left arm pain and was started on amlodipine and isosorbide mononitrate.  Because of persistent chest pain underwent nuclear stress test which she is nonischemic.  She has had complete resolution of left arm pain since last office visit.  Suspect her left arm pain is probably from arthritis.  The patient is waiting to receive ortho evaluation of her right arm via MRI.  However she also states  that her left arm pain is now much improved and she is thinking of canceling further evaluation for now.  In view of low risk nuclear stress test, continue present medical therapy.  Blood pressure is under excellent control since increasing the dose of hydralazine to 3 times daily dosing.  Dyspnea on exertion has remained stable, no clinical evidence of heart failure.  I will see her back on a prn basis.    Adrian Prows, MD, [facility] 07/22/2021, 9:23 AM Office: [phone] Fax: [phone] Pager: [phone]

## 2021-07-28 DIAGNOSIS — H353190 Nonexudative age-related macular degeneration, unspecified eye, stage unspecified: Secondary | ICD-10-CM | POA: Diagnosis not present

## 2021-07-28 DIAGNOSIS — H1033 Unspecified acute conjunctivitis, bilateral: Secondary | ICD-10-CM | POA: Diagnosis not present

## 2021-07-29 ENCOUNTER — Other Ambulatory Visit: Payer: Self-pay | Admitting: Cardiology

## 2021-07-29 DIAGNOSIS — I209 Angina pectoris, unspecified: Secondary | ICD-10-CM

## 2021-07-30 DIAGNOSIS — M810 Age-related osteoporosis without current pathological fracture: Secondary | ICD-10-CM | POA: Diagnosis not present

## 2021-07-30 DIAGNOSIS — Z23 Encounter for immunization: Secondary | ICD-10-CM | POA: Diagnosis not present

## 2021-07-31 DIAGNOSIS — K219 Gastro-esophageal reflux disease without esophagitis: Secondary | ICD-10-CM | POA: Diagnosis not present

## 2021-07-31 DIAGNOSIS — H35033 Hypertensive retinopathy, bilateral: Secondary | ICD-10-CM | POA: Diagnosis not present

## 2021-07-31 DIAGNOSIS — E039 Hypothyroidism, unspecified: Secondary | ICD-10-CM | POA: Diagnosis not present

## 2021-07-31 DIAGNOSIS — I10 Essential (primary) hypertension: Secondary | ICD-10-CM | POA: Diagnosis not present

## 2021-07-31 DIAGNOSIS — M069 Rheumatoid arthritis, unspecified: Secondary | ICD-10-CM | POA: Diagnosis not present

## 2021-07-31 DIAGNOSIS — M810 Age-related osteoporosis without current pathological fracture: Secondary | ICD-10-CM | POA: Diagnosis not present

## 2021-07-31 DIAGNOSIS — N1831 Chronic kidney disease, stage 3a: Secondary | ICD-10-CM | POA: Diagnosis not present

## 2021-08-04 ENCOUNTER — Telehealth: Payer: Self-pay | Admitting: Cardiology

## 2021-08-04 NOTE — Telephone Encounter (Signed)
PT called for refills on medication forward pt to [employer] VM

## 2021-08-06 ENCOUNTER — Telehealth: Payer: Self-pay | Admitting: Cardiology

## 2021-08-06 NOTE — Telephone Encounter (Signed)
Patient has a few questions regarding if a refill, would like a call back (she called on Monday). [phone].

## 2021-08-07 ENCOUNTER — Other Ambulatory Visit: Payer: Self-pay

## 2021-08-07 ENCOUNTER — Other Ambulatory Visit: Payer: Self-pay | Admitting: Cardiology

## 2021-08-07 ENCOUNTER — Telehealth: Payer: Self-pay | Admitting: Cardiology

## 2021-08-07 DIAGNOSIS — I209 Angina pectoris, unspecified: Secondary | ICD-10-CM

## 2021-08-07 DIAGNOSIS — E7800 Pure hypercholesterolemia, unspecified: Secondary | ICD-10-CM

## 2021-08-07 MED ORDER — ATORVASTATIN CALCIUM 10 MG PO TABS: 10.0000 mg | ORAL_TABLET | Freq: Every day | ORAL | 2 refills | Status: DC

## 2021-08-07 NOTE — Telephone Encounter (Signed)
Done

## 2021-08-07 NOTE — Telephone Encounter (Signed)
Called patient, NA, LMAM

## 2021-08-07 NOTE — Telephone Encounter (Signed)
Attempted to call pt, no answer. Left VM requesting call back.

## 2021-08-08 DIAGNOSIS — H35033 Hypertensive retinopathy, bilateral: Secondary | ICD-10-CM | POA: Diagnosis not present

## 2021-08-08 DIAGNOSIS — H43813 Vitreous degeneration, bilateral: Secondary | ICD-10-CM | POA: Diagnosis not present

## 2021-08-08 DIAGNOSIS — H35372 Puckering of macula, left eye: Secondary | ICD-10-CM | POA: Diagnosis not present

## 2021-08-08 DIAGNOSIS — H353132 Nonexudative age-related macular degeneration, bilateral, intermediate dry stage: Secondary | ICD-10-CM | POA: Diagnosis not present

## 2021-08-14 DIAGNOSIS — M069 Rheumatoid arthritis, unspecified: Secondary | ICD-10-CM | POA: Diagnosis not present

## 2021-08-14 DIAGNOSIS — I10 Essential (primary) hypertension: Secondary | ICD-10-CM | POA: Diagnosis not present

## 2021-08-18 DIAGNOSIS — H1033 Unspecified acute conjunctivitis, bilateral: Secondary | ICD-10-CM | POA: Diagnosis not present

## 2021-08-18 DIAGNOSIS — R7989 Other specified abnormal findings of blood chemistry: Secondary | ICD-10-CM | POA: Diagnosis not present

## 2021-08-18 DIAGNOSIS — H353190 Nonexudative age-related macular degeneration, unspecified eye, stage unspecified: Secondary | ICD-10-CM | POA: Diagnosis not present

## 2021-09-02 DIAGNOSIS — M810 Age-related osteoporosis without current pathological fracture: Secondary | ICD-10-CM | POA: Diagnosis not present

## 2021-09-05 DIAGNOSIS — E871 Hypo-osmolality and hyponatremia: Secondary | ICD-10-CM | POA: Diagnosis not present

## 2021-09-05 DIAGNOSIS — I10 Essential (primary) hypertension: Secondary | ICD-10-CM | POA: Diagnosis not present

## 2021-09-29 DIAGNOSIS — Z7952 Long term (current) use of systemic steroids: Secondary | ICD-10-CM | POA: Diagnosis not present

## 2021-09-29 DIAGNOSIS — M25552 Pain in left hip: Secondary | ICD-10-CM | POA: Diagnosis not present

## 2021-09-29 DIAGNOSIS — E559 Vitamin D deficiency, unspecified: Secondary | ICD-10-CM | POA: Diagnosis not present

## 2021-09-29 DIAGNOSIS — M542 Cervicalgia: Secondary | ICD-10-CM | POA: Diagnosis not present

## 2021-09-29 DIAGNOSIS — Z840 Family history of diseases of the skin and subcutaneous tissue: Secondary | ICD-10-CM | POA: Diagnosis not present

## 2021-09-29 DIAGNOSIS — M150 Primary generalized (osteo)arthritis: Secondary | ICD-10-CM | POA: Diagnosis not present

## 2021-09-29 DIAGNOSIS — Z8739 Personal history of other diseases of the musculoskeletal system and connective tissue: Secondary | ICD-10-CM | POA: Diagnosis not present

## 2021-09-29 DIAGNOSIS — E663 Overweight: Secondary | ICD-10-CM | POA: Diagnosis not present

## 2021-09-29 DIAGNOSIS — M810 Age-related osteoporosis without current pathological fracture: Secondary | ICD-10-CM | POA: Diagnosis not present

## 2021-09-29 DIAGNOSIS — Z6826 Body mass index (BMI) 26.0-26.9, adult: Secondary | ICD-10-CM | POA: Diagnosis not present

## 2021-09-29 DIAGNOSIS — M0600 Rheumatoid arthritis without rheumatoid factor, unspecified site: Secondary | ICD-10-CM | POA: Diagnosis not present

## 2021-10-07 DIAGNOSIS — M542 Cervicalgia: Secondary | ICD-10-CM | POA: Diagnosis not present

## 2021-10-08 DIAGNOSIS — M069 Rheumatoid arthritis, unspecified: Secondary | ICD-10-CM | POA: Diagnosis not present

## 2021-10-08 DIAGNOSIS — I10 Essential (primary) hypertension: Secondary | ICD-10-CM | POA: Diagnosis not present

## 2021-10-08 DIAGNOSIS — H35033 Hypertensive retinopathy, bilateral: Secondary | ICD-10-CM | POA: Diagnosis not present

## 2021-10-08 DIAGNOSIS — N1831 Chronic kidney disease, stage 3a: Secondary | ICD-10-CM | POA: Diagnosis not present

## 2021-10-08 DIAGNOSIS — M810 Age-related osteoporosis without current pathological fracture: Secondary | ICD-10-CM | POA: Diagnosis not present

## 2021-10-08 DIAGNOSIS — K219 Gastro-esophageal reflux disease without esophagitis: Secondary | ICD-10-CM | POA: Diagnosis not present

## 2021-10-08 DIAGNOSIS — E039 Hypothyroidism, unspecified: Secondary | ICD-10-CM | POA: Diagnosis not present

## 2021-10-12 DIAGNOSIS — J069 Acute upper respiratory infection, unspecified: Secondary | ICD-10-CM | POA: Diagnosis not present

## 2021-10-12 DIAGNOSIS — Z03818 Encounter for observation for suspected exposure to other biological agents ruled out: Secondary | ICD-10-CM | POA: Diagnosis not present

## 2021-10-12 DIAGNOSIS — R0981 Nasal congestion: Secondary | ICD-10-CM | POA: Diagnosis not present

## 2021-10-12 DIAGNOSIS — R051 Acute cough: Secondary | ICD-10-CM | POA: Diagnosis not present

## 2021-10-15 DIAGNOSIS — H903 Sensorineural hearing loss, bilateral: Secondary | ICD-10-CM | POA: Diagnosis not present

## 2021-10-16 ENCOUNTER — Other Ambulatory Visit: Payer: Self-pay | Admitting: Cardiology

## 2021-10-16 DIAGNOSIS — I10 Essential (primary) hypertension: Secondary | ICD-10-CM

## 2021-12-11 DIAGNOSIS — I10 Essential (primary) hypertension: Secondary | ICD-10-CM | POA: Diagnosis not present

## 2021-12-11 DIAGNOSIS — E039 Hypothyroidism, unspecified: Secondary | ICD-10-CM | POA: Diagnosis not present

## 2022-01-05 DIAGNOSIS — Z7952 Long term (current) use of systemic steroids: Secondary | ICD-10-CM | POA: Diagnosis not present

## 2022-01-05 DIAGNOSIS — Z6825 Body mass index (BMI) 25.0-25.9, adult: Secondary | ICD-10-CM | POA: Diagnosis not present

## 2022-01-05 DIAGNOSIS — M25552 Pain in left hip: Secondary | ICD-10-CM | POA: Diagnosis not present

## 2022-01-05 DIAGNOSIS — Z8739 Personal history of other diseases of the musculoskeletal system and connective tissue: Secondary | ICD-10-CM | POA: Diagnosis not present

## 2022-01-05 DIAGNOSIS — M0600 Rheumatoid arthritis without rheumatoid factor, unspecified site: Secondary | ICD-10-CM | POA: Diagnosis not present

## 2022-01-05 DIAGNOSIS — M150 Primary generalized (osteo)arthritis: Secondary | ICD-10-CM | POA: Diagnosis not present

## 2022-01-05 DIAGNOSIS — E559 Vitamin D deficiency, unspecified: Secondary | ICD-10-CM | POA: Diagnosis not present

## 2022-01-05 DIAGNOSIS — E663 Overweight: Secondary | ICD-10-CM | POA: Diagnosis not present

## 2022-01-05 DIAGNOSIS — M542 Cervicalgia: Secondary | ICD-10-CM | POA: Diagnosis not present

## 2022-01-05 DIAGNOSIS — M810 Age-related osteoporosis without current pathological fracture: Secondary | ICD-10-CM | POA: Diagnosis not present

## 2022-01-05 DIAGNOSIS — Z840 Family history of diseases of the skin and subcutaneous tissue: Secondary | ICD-10-CM | POA: Diagnosis not present

## 2022-01-09 DIAGNOSIS — K5900 Constipation, unspecified: Secondary | ICD-10-CM | POA: Diagnosis not present

## 2022-01-09 DIAGNOSIS — K219 Gastro-esophageal reflux disease without esophagitis: Secondary | ICD-10-CM | POA: Diagnosis not present

## 2022-01-23 DIAGNOSIS — E039 Hypothyroidism, unspecified: Secondary | ICD-10-CM | POA: Diagnosis not present

## 2022-01-23 DIAGNOSIS — N1831 Chronic kidney disease, stage 3a: Secondary | ICD-10-CM | POA: Diagnosis not present

## 2022-02-06 ENCOUNTER — Other Ambulatory Visit: Payer: Self-pay | Admitting: Cardiology

## 2022-02-06 DIAGNOSIS — I209 Angina pectoris, unspecified: Secondary | ICD-10-CM

## 2022-02-09 ENCOUNTER — Other Ambulatory Visit: Payer: Self-pay

## 2022-02-09 DIAGNOSIS — H35033 Hypertensive retinopathy, bilateral: Secondary | ICD-10-CM | POA: Diagnosis not present

## 2022-02-09 DIAGNOSIS — I209 Angina pectoris, unspecified: Secondary | ICD-10-CM

## 2022-02-09 DIAGNOSIS — H353132 Nonexudative age-related macular degeneration, bilateral, intermediate dry stage: Secondary | ICD-10-CM | POA: Diagnosis not present

## 2022-02-09 DIAGNOSIS — H35372 Puckering of macula, left eye: Secondary | ICD-10-CM | POA: Diagnosis not present

## 2022-02-09 DIAGNOSIS — H43813 Vitreous degeneration, bilateral: Secondary | ICD-10-CM | POA: Diagnosis not present

## 2022-02-09 MED ORDER — ISOSORBIDE MONONITRATE ER 60 MG PO TB24: 60.0000 mg | ORAL_TABLET | Freq: Every day | ORAL | 3 refills | Status: DC

## 2022-02-10 ENCOUNTER — Other Ambulatory Visit: Payer: Self-pay | Admitting: Cardiology

## 2022-02-10 ENCOUNTER — Telehealth: Payer: Self-pay

## 2022-02-10 ENCOUNTER — Other Ambulatory Visit: Payer: Self-pay

## 2022-02-10 DIAGNOSIS — I209 Angina pectoris, unspecified: Secondary | ICD-10-CM

## 2022-02-10 DIAGNOSIS — E7800 Pure hypercholesterolemia, unspecified: Secondary | ICD-10-CM

## 2022-02-10 DIAGNOSIS — I10 Essential (primary) hypertension: Secondary | ICD-10-CM

## 2022-02-10 MED ORDER — ISOSORBIDE MONONITRATE ER 60 MG PO TB24: 60.0000 mg | ORAL_TABLET | Freq: Every day | ORAL | 3 refills | Status: DC

## 2022-02-10 MED ORDER — ATORVASTATIN CALCIUM 10 MG PO TABS: 10.0000 mg | ORAL_TABLET | Freq: Every day | ORAL | 3 refills | Status: DC

## 2022-02-10 MED ORDER — HYDRALAZINE HCL 25 MG PO TABS: 25.0000 mg | ORAL_TABLET | Freq: Three times a day (TID) | ORAL | 3 refills | Status: DC

## 2022-02-10 NOTE — Telephone Encounter (Signed)
Daughter called asking with a refill request on : ? ?Imdur ?Hydralazine ?Atorvastatin ?

## 2022-03-13 DIAGNOSIS — Z23 Encounter for immunization: Secondary | ICD-10-CM | POA: Diagnosis not present

## 2022-03-16 DIAGNOSIS — Z0000 Encounter for general adult medical examination without abnormal findings: Secondary | ICD-10-CM | POA: Diagnosis not present

## 2022-03-16 DIAGNOSIS — Z1389 Encounter for screening for other disorder: Secondary | ICD-10-CM | POA: Diagnosis not present

## 2022-03-24 DIAGNOSIS — M069 Rheumatoid arthritis, unspecified: Secondary | ICD-10-CM | POA: Diagnosis not present

## 2022-03-24 DIAGNOSIS — M810 Age-related osteoporosis without current pathological fracture: Secondary | ICD-10-CM | POA: Diagnosis not present

## 2022-03-24 DIAGNOSIS — E039 Hypothyroidism, unspecified: Secondary | ICD-10-CM | POA: Diagnosis not present

## 2022-03-24 DIAGNOSIS — I10 Essential (primary) hypertension: Secondary | ICD-10-CM | POA: Diagnosis not present

## 2022-03-24 DIAGNOSIS — H9190 Unspecified hearing loss, unspecified ear: Secondary | ICD-10-CM | POA: Diagnosis not present

## 2022-03-24 DIAGNOSIS — N1831 Chronic kidney disease, stage 3a: Secondary | ICD-10-CM | POA: Diagnosis not present

## 2022-03-24 DIAGNOSIS — E559 Vitamin D deficiency, unspecified: Secondary | ICD-10-CM | POA: Diagnosis not present

## 2022-03-24 DIAGNOSIS — K219 Gastro-esophageal reflux disease without esophagitis: Secondary | ICD-10-CM | POA: Diagnosis not present

## 2022-04-13 DIAGNOSIS — M0600 Rheumatoid arthritis without rheumatoid factor, unspecified site: Secondary | ICD-10-CM | POA: Diagnosis not present

## 2022-04-13 DIAGNOSIS — Z840 Family history of diseases of the skin and subcutaneous tissue: Secondary | ICD-10-CM | POA: Diagnosis not present

## 2022-04-13 DIAGNOSIS — Z6825 Body mass index (BMI) 25.0-25.9, adult: Secondary | ICD-10-CM | POA: Diagnosis not present

## 2022-04-13 DIAGNOSIS — Z7952 Long term (current) use of systemic steroids: Secondary | ICD-10-CM | POA: Diagnosis not present

## 2022-04-13 DIAGNOSIS — M810 Age-related osteoporosis without current pathological fracture: Secondary | ICD-10-CM | POA: Diagnosis not present

## 2022-04-13 DIAGNOSIS — E559 Vitamin D deficiency, unspecified: Secondary | ICD-10-CM | POA: Diagnosis not present

## 2022-04-13 DIAGNOSIS — E663 Overweight: Secondary | ICD-10-CM | POA: Diagnosis not present

## 2022-04-13 DIAGNOSIS — Z8739 Personal history of other diseases of the musculoskeletal system and connective tissue: Secondary | ICD-10-CM | POA: Diagnosis not present

## 2022-04-13 DIAGNOSIS — M1991 Primary osteoarthritis, unspecified site: Secondary | ICD-10-CM | POA: Diagnosis not present

## 2022-04-15 ENCOUNTER — Other Ambulatory Visit: Payer: Self-pay | Admitting: Cardiology

## 2022-04-15 DIAGNOSIS — I10 Essential (primary) hypertension: Secondary | ICD-10-CM

## 2022-04-20 DIAGNOSIS — M810 Age-related osteoporosis without current pathological fracture: Secondary | ICD-10-CM | POA: Diagnosis not present

## 2022-04-22 ENCOUNTER — Other Ambulatory Visit: Payer: Self-pay | Admitting: Cardiology

## 2022-04-22 DIAGNOSIS — I209 Angina pectoris, unspecified: Secondary | ICD-10-CM

## 2022-04-29 DIAGNOSIS — L720 Epidermal cyst: Secondary | ICD-10-CM | POA: Diagnosis not present

## 2022-04-29 DIAGNOSIS — Z85828 Personal history of other malignant neoplasm of skin: Secondary | ICD-10-CM | POA: Diagnosis not present

## 2022-04-29 DIAGNOSIS — D2239 Melanocytic nevi of other parts of face: Secondary | ICD-10-CM | POA: Diagnosis not present

## 2022-04-29 DIAGNOSIS — L821 Other seborrheic keratosis: Secondary | ICD-10-CM | POA: Diagnosis not present

## 2022-04-29 DIAGNOSIS — D2272 Melanocytic nevi of left lower limb, including hip: Secondary | ICD-10-CM | POA: Diagnosis not present

## 2022-04-29 DIAGNOSIS — D1801 Hemangioma of skin and subcutaneous tissue: Secondary | ICD-10-CM | POA: Diagnosis not present

## 2022-04-29 DIAGNOSIS — L570 Actinic keratosis: Secondary | ICD-10-CM | POA: Diagnosis not present

## 2022-06-08 DIAGNOSIS — K219 Gastro-esophageal reflux disease without esophagitis: Secondary | ICD-10-CM | POA: Diagnosis not present

## 2022-06-08 DIAGNOSIS — K5900 Constipation, unspecified: Secondary | ICD-10-CM | POA: Diagnosis not present

## 2022-06-09 DIAGNOSIS — M0600 Rheumatoid arthritis without rheumatoid factor, unspecified site: Secondary | ICD-10-CM | POA: Diagnosis not present

## 2022-06-09 DIAGNOSIS — Z8739 Personal history of other diseases of the musculoskeletal system and connective tissue: Secondary | ICD-10-CM | POA: Diagnosis not present

## 2022-06-09 DIAGNOSIS — Z7952 Long term (current) use of systemic steroids: Secondary | ICD-10-CM | POA: Diagnosis not present

## 2022-06-09 DIAGNOSIS — E663 Overweight: Secondary | ICD-10-CM | POA: Diagnosis not present

## 2022-06-09 DIAGNOSIS — M810 Age-related osteoporosis without current pathological fracture: Secondary | ICD-10-CM | POA: Diagnosis not present

## 2022-06-09 DIAGNOSIS — Z6825 Body mass index (BMI) 25.0-25.9, adult: Secondary | ICD-10-CM | POA: Diagnosis not present

## 2022-06-09 DIAGNOSIS — Z840 Family history of diseases of the skin and subcutaneous tissue: Secondary | ICD-10-CM | POA: Diagnosis not present

## 2022-06-09 DIAGNOSIS — M1991 Primary osteoarthritis, unspecified site: Secondary | ICD-10-CM | POA: Diagnosis not present

## 2022-06-09 DIAGNOSIS — E559 Vitamin D deficiency, unspecified: Secondary | ICD-10-CM | POA: Diagnosis not present

## 2022-06-12 DIAGNOSIS — H43813 Vitreous degeneration, bilateral: Secondary | ICD-10-CM | POA: Diagnosis not present

## 2022-06-12 DIAGNOSIS — H353132 Nonexudative age-related macular degeneration, bilateral, intermediate dry stage: Secondary | ICD-10-CM | POA: Diagnosis not present

## 2022-06-12 DIAGNOSIS — H35033 Hypertensive retinopathy, bilateral: Secondary | ICD-10-CM | POA: Diagnosis not present

## 2022-06-12 DIAGNOSIS — H35372 Puckering of macula, left eye: Secondary | ICD-10-CM | POA: Diagnosis not present

## 2022-07-16 DIAGNOSIS — I10 Essential (primary) hypertension: Secondary | ICD-10-CM | POA: Diagnosis not present

## 2022-07-16 DIAGNOSIS — K219 Gastro-esophageal reflux disease without esophagitis: Secondary | ICD-10-CM | POA: Diagnosis not present

## 2022-07-16 DIAGNOSIS — M810 Age-related osteoporosis without current pathological fracture: Secondary | ICD-10-CM | POA: Diagnosis not present

## 2022-07-16 DIAGNOSIS — N1831 Chronic kidney disease, stage 3a: Secondary | ICD-10-CM | POA: Diagnosis not present

## 2022-07-16 DIAGNOSIS — M069 Rheumatoid arthritis, unspecified: Secondary | ICD-10-CM | POA: Diagnosis not present

## 2022-07-16 DIAGNOSIS — E039 Hypothyroidism, unspecified: Secondary | ICD-10-CM | POA: Diagnosis not present

## 2022-07-22 ENCOUNTER — Observation Stay (HOSPITAL_BASED_OUTPATIENT_CLINIC_OR_DEPARTMENT_OTHER)
Admission: EM | Admit: 2022-07-22 | Discharge: 2022-07-24 | Disposition: A | Discharge status: Home / Self Care | Payer: Medicare Other | Attending: Family Medicine | Admitting: Family Medicine

## 2022-07-22 ENCOUNTER — Emergency Department (HOSPITAL_BASED_OUTPATIENT_CLINIC_OR_DEPARTMENT_OTHER): Payer: Medicare Other

## 2022-07-22 ENCOUNTER — Encounter (HOSPITAL_COMMUNITY): Payer: Self-pay

## 2022-07-22 ENCOUNTER — Encounter (HOSPITAL_BASED_OUTPATIENT_CLINIC_OR_DEPARTMENT_OTHER): Payer: Self-pay

## 2022-07-22 ENCOUNTER — Other Ambulatory Visit: Payer: Self-pay

## 2022-07-22 DIAGNOSIS — R2990 Unspecified symptoms and signs involving the nervous system: Secondary | ICD-10-CM | POA: Diagnosis present

## 2022-07-22 DIAGNOSIS — Z7982 Long term (current) use of aspirin: Secondary | ICD-10-CM | POA: Insufficient documentation

## 2022-07-22 DIAGNOSIS — R2689 Other abnormalities of gait and mobility: Secondary | ICD-10-CM | POA: Diagnosis not present

## 2022-07-22 DIAGNOSIS — K219 Gastro-esophageal reflux disease without esophagitis: Secondary | ICD-10-CM | POA: Diagnosis not present

## 2022-07-22 DIAGNOSIS — R4781 Slurred speech: Secondary | ICD-10-CM | POA: Diagnosis not present

## 2022-07-22 DIAGNOSIS — E039 Hypothyroidism, unspecified: Secondary | ICD-10-CM | POA: Diagnosis present

## 2022-07-22 DIAGNOSIS — R270 Ataxia, unspecified: Secondary | ICD-10-CM | POA: Insufficient documentation

## 2022-07-22 DIAGNOSIS — I10 Essential (primary) hypertension: Secondary | ICD-10-CM | POA: Diagnosis not present

## 2022-07-22 DIAGNOSIS — I639 Cerebral infarction, unspecified: Principal | ICD-10-CM | POA: Insufficient documentation

## 2022-07-22 DIAGNOSIS — H547 Unspecified visual loss: Secondary | ICD-10-CM

## 2022-07-22 DIAGNOSIS — Z7902 Long term (current) use of antithrombotics/antiplatelets: Secondary | ICD-10-CM | POA: Diagnosis not present

## 2022-07-22 DIAGNOSIS — H539 Unspecified visual disturbance: Secondary | ICD-10-CM | POA: Diagnosis not present

## 2022-07-22 DIAGNOSIS — M0600 Rheumatoid arthritis without rheumatoid factor, unspecified site: Secondary | ICD-10-CM | POA: Diagnosis present

## 2022-07-22 DIAGNOSIS — Z79899 Other long term (current) drug therapy: Secondary | ICD-10-CM | POA: Diagnosis not present

## 2022-07-22 DIAGNOSIS — H538 Other visual disturbances: Secondary | ICD-10-CM | POA: Diagnosis present

## 2022-07-22 LAB — CBC
HCT: 36.2 % (ref 36.0–46.0)
Hemoglobin: 11.9 g/dL — ABNORMAL LOW (ref 12.0–15.0)
MCH: 30.5 pg (ref 26.0–34.0)
MCHC: 32.9 g/dL (ref 30.0–36.0)
MCV: 92.8 fL (ref 80.0–100.0)
Platelets: 251 10*3/uL (ref 150–400)
RBC: 3.9 MIL/uL (ref 3.87–5.11)
RDW: 14.4 % (ref 11.5–15.5)
WBC: 9.9 10*3/uL (ref 4.0–10.5)
nRBC: 0 % (ref 0.0–0.2)

## 2022-07-22 LAB — DIFFERENTIAL
Abs Immature Granulocytes: 0.04 10*3/uL (ref 0.00–0.07)
Basophils Absolute: 0.1 10*3/uL (ref 0.0–0.1)
Basophils Relative: 1 %
Eosinophils Absolute: 0.2 10*3/uL (ref 0.0–0.5)
Eosinophils Relative: 2 %
Immature Granulocytes: 0 %
Lymphocytes Relative: 14 %
Lymphs Abs: 1.4 10*3/uL (ref 0.7–4.0)
Monocytes Absolute: 1.1 10*3/uL — ABNORMAL HIGH (ref 0.1–1.0)
Monocytes Relative: 11 %
Neutro Abs: 7.2 10*3/uL (ref 1.7–7.7)
Neutrophils Relative %: 72 %

## 2022-07-22 LAB — COMPREHENSIVE METABOLIC PANEL
ALT: 27 U/L (ref 0–44)
AST: 20 U/L (ref 15–41)
Albumin: 4.2 g/dL (ref 3.5–5.0)
Alkaline Phosphatase: 72 U/L (ref 38–126)
Anion gap: 11 (ref 5–15)
BUN: 21 mg/dL (ref 8–23)
CO2: 23 mmol/L (ref 22–32)
Calcium: 9 mg/dL (ref 8.9–10.3)
Chloride: 100 mmol/L (ref 98–111)
Creatinine, Ser: 0.9 mg/dL (ref 0.44–1.00)
GFR, Estimated: 60 mL/min — ABNORMAL LOW (ref >60)
Glucose, Bld: 117 mg/dL — ABNORMAL HIGH (ref 70–99)
Potassium: 3.8 mmol/L (ref 3.5–5.1)
Sodium: 134 mmol/L — ABNORMAL LOW (ref 135–145)
Total Bilirubin: 0.6 mg/dL (ref 0.3–1.2)
Total Protein: 7 g/dL (ref 6.5–8.1)

## 2022-07-22 LAB — ETHANOL: Alcohol, Ethyl (B): <10 mg/dL (ref <10)

## 2022-07-22 LAB — CBG MONITORING, ED: Glucose-Capillary: 108 mg/dL — ABNORMAL HIGH (ref 70–99)

## 2022-07-22 LAB — APTT: aPTT: 33 seconds (ref 24–36)

## 2022-07-22 LAB — PROTIME-INR
INR: 1 (ref 0.8–1.2)
Prothrombin Time: 13.3 seconds (ref 11.4–15.2)

## 2022-07-22 MED ORDER — CLOPIDOGREL BISULFATE 75 MG PO TABS
75.0000 mg | ORAL_TABLET | Freq: Every evening | ORAL | Status: DC
  Administered 2022-07-22 – 2022-07-24 (×3): 75 mg via ORAL
  Filled 2022-07-22 (×3): qty 1

## 2022-07-22 MED ORDER — ASPIRIN 81 MG PO TBEC
81.0000 mg | DELAYED_RELEASE_TABLET | Freq: Every evening | ORAL | Status: DC
  Administered 2022-07-22 – 2022-07-24 (×3): 81 mg via ORAL
  Filled 2022-07-22 (×3): qty 1

## 2022-07-22 MED ORDER — CLOPIDOGREL BISULFATE 75 MG PO TABS: 75.0000 mg | ORAL_TABLET | Freq: Once | ORAL | Status: DC

## 2022-07-22 MED ORDER — IOHEXOL 350 MG/ML SOLN
75.0000 mL | Freq: Once | INTRAVENOUS | Status: AC | PRN
  Administered 2022-07-23: 75 mL via INTRAVENOUS

## 2022-07-22 NOTE — ED Notes (Signed)
Pt's daughters have gone home to get her charger for her hearing aids.  They are aware she may not get a bed tonight.  They have requested RN notify them of any changes in her condition or when she is assigned a room.  They want to be present when she is evaluated by neurologist

## 2022-07-22 NOTE — ED Provider Notes (Signed)
[facility] EMERGENCY DEPT Provider Note   CSN: [id number] Arrival date & time: 07/22/22  1616     History  Chief Complaint  Patient presents with   Blurred Vision    [name] is a 88 y.o. female.  HPI    88 year old female comes in with chief complaint of blurry vision and balance issues.  Patient has previous history of stroke that was found on MRI.  She also has history of rheumatoid arthritis, hypertension.  Patient here with her 2 daughters.  According to the patient's daughter, patient actually drove to her home on Monday.  They then went to [facility] for a road trip.  Once there, they noted that patient was unsteady with her gait.  Subsequently patient also complained of vision change.  Patient has been having difficulty with activities like feeding herself but spoon properly.  Her balance has improved, but she is still not back to baseline.  Patient states that her vision is still impaired.  She is unable to read a book or magazine.  She would not be able to drive any more.  Patient denies any vertigo-like symptoms.  She also denies any focal, one-sided weakness or numbness, slurred speech.  Home Medications Prior to Admission medications   Medication Sig Start Date End Date Taking? Authorizing Provider  acetaminophen (TYLENOL) 500 MG tablet Take 500 mg by mouth every 6 (six) hours as needed.    [provider]  amLODipine (NORVASC) 5 MG tablet TAKE ONE TABLET BY MOUTH DAILY IN AFTERNOON 04/22/22   Adrian Prows, MD  aspirin EC 81 MG tablet Take 81 mg by mouth every evening.    [provider]  atorvastatin (LIPITOR) 10 MG tablet Take 1 tablet (10 mg total) by mouth daily. 02/10/22   Adrian Prows, MD  cholecalciferol (VITAMIN D) 1000 units tablet Take 1,000 Units by mouth daily.    [provider]  denosumab (PROLIA) 60 MG/ML SOSY injection Inject 60 mg into the skin every 6 (six) months.    [provider]  diphenhydrAMINE  (BENADRYL) 25 MG tablet Take 12.5 mg by mouth at bedtime as needed for sleep or allergies.    [provider]  folic acid (FOLVITE) 1 MG tablet Take 1 mg by mouth daily. 03/18/21   [provider]  hydrALAZINE (APRESOLINE) 25 MG tablet Take 1 tablet (25 mg total) by mouth 3 (three) times daily. 04/15/22   Adrian Prows, MD  isosorbide mononitrate (IMDUR) 60 MG 24 hr tablet Take 1 tablet (60 mg total) by mouth daily. 02/10/22 02/05/23  Adrian Prows, MD  levothyroxine (SYNTHROID) 100 MCG tablet TAKE ONE TABLET ONCE A DAY ON AN EMPTY STOMACH. Patient needs appointment to receive further refills. 06/05/20   Crecencio Mc, MD  losartan (COZAAR) 100 MG tablet TAKE 1 TABLET ONCE DAILY. 10/25/19   Crecencio Mc, MD  Methotrexate Sodium (METHOTREXATE, PF,) 50 MG/2ML injection 0.33m Injection once weekly 30 days 03/18/21   [provider]  Methylcellulose, Laxative, (CITRUCEL PO) Take by mouth.    [provider]  metoprolol succinate (TOPROL-XL) 50 MG 24 hr tablet TAKE 1 TABLET ONCE DAILY IN THE EVENING. 11/08/19   TCrecencio Mc MD  nitroGLYCERIN (NITROSTAT) 0.4 MG SL tablet Place 1 tablet under the tongue as needed.    [provider]  omeprazole (PRILOSEC) 40 MG capsule Take 1 capsule by mouth daily.    [provider]  predniSONE (DELTASONE) 1 MG tablet Take 3 mg by mouth  daily. 05/12/17   [provider]      Allergies    Pollen extract and Levofloxacin    Review of Systems   Review of Systems  All other systems reviewed and are negative.   Physical Exam Updated Vital Signs BP (!) 146/57   Pulse 75   Temp 98.8 F (37.1 C) (Oral)   Resp 19   SpO2 96%  Physical Exam Vitals and nursing note reviewed.  Constitutional:      Appearance: She is well-developed.  HENT:     Head: Atraumatic.  Eyes:     Extraocular Movements: Extraocular movements intact.     Pupils: Pupils are equal, round, and reactive to light.  Cardiovascular:      Rate and Rhythm: Normal rate.  Pulmonary:     Effort: Pulmonary effort is normal.  Musculoskeletal:     Cervical back: Normal range of motion and neck supple.  Skin:    General: Skin is warm and dry.  Neurological:     Mental Status: She is alert and oriented to person, place, and time.     Cranial Nerves: No cranial nerve deficit.     Sensory: No sensory deficit.     Motor: No weakness.     Coordination: Coordination normal.     Comments: Peripheral visual fields appear intact. Patient's finger-to-nose and heel-to-shin overall reveal no clear evidence of dysmetria     ED Results / Procedures / Treatments   Labs (all labs ordered are listed, but only abnormal results are displayed) Labs Reviewed  CBC - Abnormal; Notable for the following components:      Result Value   Hemoglobin 11.9 (*)    All other components within normal limits  DIFFERENTIAL - Abnormal; Notable for the following components:   Monocytes Absolute 1.1 (*)    All other components within normal limits  COMPREHENSIVE METABOLIC PANEL - Abnormal; Notable for the following components:   Sodium 134 (*)    Glucose, Bld 117 (*)    GFR, Estimated 60 (*)    All other components within normal limits  CBG MONITORING, ED - Abnormal; Notable for the following components:   Glucose-Capillary 108 (*)    All other components within normal limits  PROTIME-INR  APTT  ETHANOL    EKG None  Radiology CT HEAD WO CONTRAST  Result Date: 07/22/2022 CLINICAL DATA:  Blurry vision and gait instability. EXAM: CT HEAD WITHOUT CONTRAST TECHNIQUE: Contiguous axial images were obtained from the base of the skull through the vertex without intravenous contrast. RADIATION DOSE REDUCTION: This exam was performed according to the departmental dose-optimization program which includes automated exposure control, adjustment of the mA and/or kV according to patient size and/or use of iterative reconstruction technique. COMPARISON:  MRI brain  dated April 15, 2016. CT head dated April 14, 2016. FINDINGS: Brain: No evidence of acute infarction, hemorrhage, hydrocephalus, extra-axial collection or mass lesion/mass effect. Progressive mild-to-moderate generalized cerebral atrophy, within normal limits for age. Vascular: Calcified atherosclerosis at the skull base. No hyperdense vessel. Skull: Negative for fracture or focal lesion. Old left parietal burr hole again noted. Sinuses/Orbits: No acute finding. Other: None. IMPRESSION: 1. No acute intracranial abnormality. Electronically Signed   By: Titus Dubin M.D.   On: 07/22/2022 16:56    Procedures Procedures    Medications Ordered in ED Medications  aspirin EC tablet 81 mg (has no administration in time range)  clopidogrel (PLAVIX) tablet 75 mg (has no administration in time range)  ED Course/ Medical Decision Making/ A&P                           Medical Decision Making Amount and/or Complexity of Data Reviewed Labs: ordered. Radiology: ordered.   This patient presents to the ED with chief complaint(s) of vision impairment -binocular and unsteady gait with pertinent past medical history of previous cerebellar stroke, hypertension.The complaint involves an extensive differential diagnosis and also carries with it a high risk of complications and morbidity.    Patient had subtle abnormality with finger-to-nose, but overall I did not pick up clear inconsistent evidence of dysmetria.  Patient able to finger count but unable to read numbers on the cell phone.  She has no peripheral visual field loss and no diplopia or ophthalmoplegia appreciated.  NIH stroke scale is 0.  The differential diagnosis includes subacute stroke, ocular migraine, severe electrolyte abnormality, arrhythmia.  The initial plan is to get basic stroke work-up initiated, as suspicion is high for stroke.   Additional history obtained: Additional history obtained from family -patient's daughters, who  provides substantial history on patient's functional status and current situation. Records reviewed  previous MRI, that revealed cerebellar stroke  Independent labs interpretation:  The following labs were independently interpreted: CBC, BMP are reassuring.  Independent visualization and interpretation of imaging: - I independently visualized the following imaging with scope of interpretation limited to determining acute life threatening conditions related to emergency care: CT scan of the brain, which revealed no evidence of brain bleed  Treatment and Reassessment: Unchanged on reassessment.  Patient made aware that we will proceed with admission request, discussed CT scan findings.  Consultation: - Consulted or discussed management/test interpretation with external professional: Dr. Quinn Axe, neurology.  She recommends admission to the hospital.  Also recommends getting CT angio head and neck and to start patient on dual antiplatelet therapy.   Final Clinical Impression(s) / ED Diagnoses Final diagnoses:  Impaired vision  Ataxia    Rx / DC Orders ED Discharge Orders     None         Varney Biles, MD 07/22/22 1949

## 2022-07-22 NOTE — ED Notes (Signed)
Pt alert, speaking with daughters and staff. Daughters are leaving at this time. Pt oriented by this RN to the call button and verbalizes understanding, denies needs at this time.

## 2022-07-22 NOTE — ED Notes (Signed)
Patient transported to CT 

## 2022-07-22 NOTE — ED Triage Notes (Signed)
Patient here POV from Home.  Endorses Blurry Vision since 0800 Yesterday AM. Family also endorses Unsteadiness that has Developed Monday. Family also notes a "Lisp" Tone to her Speech.   No SOB or Pain. No Anticoagulants besides ASA.   LSN: Sunday at 1500.  NAD Noted during Triage. A&Ox4. GCS 15. Ambulatory.

## 2022-07-23 ENCOUNTER — Observation Stay (HOSPITAL_COMMUNITY): Payer: Medicare Other

## 2022-07-23 ENCOUNTER — Observation Stay (HOSPITAL_BASED_OUTPATIENT_CLINIC_OR_DEPARTMENT_OTHER): Payer: Medicare Other

## 2022-07-23 DIAGNOSIS — Z7902 Long term (current) use of antithrombotics/antiplatelets: Secondary | ICD-10-CM | POA: Diagnosis not present

## 2022-07-23 DIAGNOSIS — I6389 Other cerebral infarction: Secondary | ICD-10-CM

## 2022-07-23 DIAGNOSIS — R2990 Unspecified symptoms and signs involving the nervous system: Secondary | ICD-10-CM

## 2022-07-23 DIAGNOSIS — K219 Gastro-esophageal reflux disease without esophagitis: Secondary | ICD-10-CM | POA: Diagnosis not present

## 2022-07-23 DIAGNOSIS — I10 Essential (primary) hypertension: Secondary | ICD-10-CM

## 2022-07-23 DIAGNOSIS — I672 Cerebral atherosclerosis: Secondary | ICD-10-CM | POA: Diagnosis not present

## 2022-07-23 DIAGNOSIS — R4781 Slurred speech: Secondary | ICD-10-CM | POA: Diagnosis not present

## 2022-07-23 DIAGNOSIS — E039 Hypothyroidism, unspecified: Secondary | ICD-10-CM | POA: Diagnosis not present

## 2022-07-23 DIAGNOSIS — I6523 Occlusion and stenosis of bilateral carotid arteries: Secondary | ICD-10-CM | POA: Diagnosis not present

## 2022-07-23 DIAGNOSIS — H538 Other visual disturbances: Secondary | ICD-10-CM | POA: Diagnosis not present

## 2022-07-23 DIAGNOSIS — Z79899 Other long term (current) drug therapy: Secondary | ICD-10-CM | POA: Diagnosis not present

## 2022-07-23 DIAGNOSIS — Z7982 Long term (current) use of aspirin: Secondary | ICD-10-CM | POA: Diagnosis not present

## 2022-07-23 DIAGNOSIS — R270 Ataxia, unspecified: Secondary | ICD-10-CM | POA: Diagnosis not present

## 2022-07-23 DIAGNOSIS — I639 Cerebral infarction, unspecified: Secondary | ICD-10-CM | POA: Diagnosis not present

## 2022-07-23 LAB — ECHOCARDIOGRAM COMPLETE
AR max vel: 1.1 cm2
AV Area VTI: 1.13 cm2
AV Area mean vel: 1.09 cm2
AV Mean grad: 18 mmHg
AV Peak grad: 33.4 mmHg
Ao pk vel: 2.89 m/s
MV VTI: 1.26 cm2
P 1/2 time: 336 msec
S' Lateral: 2.1 cm

## 2022-07-23 LAB — TSH: TSH: 2.813 u[IU]/mL (ref 0.350–4.500)

## 2022-07-23 MED ORDER — HYDRALAZINE HCL 20 MG/ML IJ SOLN: 10.0000 mg | Freq: Three times a day (TID) | INTRAMUSCULAR | Status: DC | PRN

## 2022-07-23 MED ORDER — ENOXAPARIN SODIUM 40 MG/0.4ML IJ SOSY
40.0000 mg | PREFILLED_SYRINGE | INTRAMUSCULAR | Status: DC
  Administered 2022-07-23 – 2022-07-24 (×2): 40 mg via SUBCUTANEOUS
  Filled 2022-07-23 (×2): qty 0.4

## 2022-07-23 MED ORDER — ATORVASTATIN CALCIUM 10 MG PO TABS
10.0000 mg | ORAL_TABLET | Freq: Every day | ORAL | Status: DC
  Administered 2022-07-23 – 2022-07-24 (×2): 10 mg via ORAL
  Filled 2022-07-23 (×2): qty 1

## 2022-07-23 MED ORDER — ACETAMINOPHEN 650 MG RE SUPP: 650.0000 mg | RECTAL | Status: DC | PRN

## 2022-07-23 MED ORDER — PREDNISONE 1 MG PO TABS
3.0000 mg | ORAL_TABLET | Freq: Every day | ORAL | Status: DC
  Administered 2022-07-23 – 2022-07-24 (×2): 3 mg via ORAL
  Filled 2022-07-23 (×2): qty 3

## 2022-07-23 MED ORDER — SENNOSIDES-DOCUSATE SODIUM 8.6-50 MG PO TABS: 1.0000 | ORAL_TABLET | Freq: Every evening | ORAL | Status: DC | PRN

## 2022-07-23 MED ORDER — AMLODIPINE BESYLATE 5 MG PO TABS
5.0000 mg | ORAL_TABLET | Freq: Every day | ORAL | Status: DC
  Administered 2022-07-24: 5 mg via ORAL
  Filled 2022-07-23: qty 1

## 2022-07-23 MED ORDER — LEVOTHYROXINE SODIUM 112 MCG PO TABS
112.0000 ug | ORAL_TABLET | Freq: Every morning | ORAL | Status: DC
  Administered 2022-07-24: 112 ug via ORAL
  Filled 2022-07-23: qty 1

## 2022-07-23 MED ORDER — FOLIC ACID 1 MG PO TABS
1.0000 mg | ORAL_TABLET | Freq: Every day | ORAL | Status: DC
  Administered 2022-07-23 – 2022-07-24 (×2): 1 mg via ORAL
  Filled 2022-07-23 (×2): qty 1

## 2022-07-23 MED ORDER — DIPHENHYDRAMINE HCL 25 MG PO TABS: 12.5000 mg | ORAL_TABLET | Freq: Every evening | ORAL | Status: DC | PRN

## 2022-07-23 MED ORDER — FAMOTIDINE 20 MG PO TABS
20.0000 mg | ORAL_TABLET | Freq: Every day | ORAL | Status: DC
  Filled 2022-07-23: qty 1

## 2022-07-23 MED ORDER — ACETAMINOPHEN 160 MG/5ML PO SOLN: 650.0000 mg | ORAL | Status: DC | PRN

## 2022-07-23 MED ORDER — PRESERVISION AREDS 2 PO CAPS: 1.0000 | ORAL_CAPSULE | Freq: Two times a day (BID) | ORAL | Status: DC

## 2022-07-23 MED ORDER — METHYLCELLULOSE (LAXATIVE) PO POWD: 1.0000 | Freq: Every day | ORAL | Status: DC

## 2022-07-23 MED ORDER — STROKE: EARLY STAGES OF RECOVERY BOOK
Freq: Once | Status: AC
  Filled 2022-07-23: qty 1

## 2022-07-23 MED ORDER — ACETAMINOPHEN 325 MG PO TABS: 650.0000 mg | ORAL_TABLET | ORAL | Status: DC | PRN

## 2022-07-23 MED ORDER — PANTOPRAZOLE SODIUM 40 MG PO TBEC
80.0000 mg | DELAYED_RELEASE_TABLET | Freq: Every day | ORAL | Status: DC
  Administered 2022-07-23 – 2022-07-24 (×2): 80 mg via ORAL
  Filled 2022-07-23 (×2): qty 2

## 2022-07-23 NOTE — Assessment & Plan Note (Signed)
Check TSH ?Continue home synthroid  ?

## 2022-07-23 NOTE — Assessment & Plan Note (Addendum)
Blood pressure has been soft and family reports has been low at home. 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

## 2022-07-23 NOTE — Assessment & Plan Note (Signed)
Continue pepcid and PPI

## 2022-07-23 NOTE — Progress Notes (Signed)
  Echocardiogram 2D Echocardiogram has been performed.  Johny Chess 07/23/2022, 4:11 PM

## 2022-07-23 NOTE — Evaluation (Signed)
Physical Therapy Evaluation Patient Details Name: [name] MRN: [id number] DOB: 04/28/1927 Today's Date: 07/23/2022  History of Present Illness  88 yo female presenting to ED on 9/27 with blurry vision and balance deficits. Head CT negative for acute change. PMH including PMR, seronegative RA, steroid induced osteoporosis, HTN, hypothyroidism, hx of TIA, history of ambulatory dysfunction, HLD.  Clinical Impression  Patient admitted with the above. PTA, patient lives at [place] and was independent with all mobility utilizing cane for community mobility but otherwise without AD. Patient currently at baseline functioning with no AD. Demonstrating mild balance deficits but no overt LOB. Denies falls in past 6 months. Encouraged use of cane for mobility if feeling unstable. Daughter reports patient has received PT in past for balance. No further skilled PT needs identified acutely. No PT follow up recommended at this time. PT will sign off.        Recommendations for follow up therapy are one component of a multi-disciplinary discharge planning process, led by the attending physician.  Recommendations may be updated based on patient status, additional functional criteria and insurance authorization.  Follow Up Recommendations No PT follow up      Assistance Recommended at Discharge PRN  Patient can return home with the following       Equipment Recommendations None recommended by PT  Recommendations for Other Services       Functional Status Assessment Patient has had a recent decline in their functional status and demonstrates the ability to make significant improvements in function in a reasonable and predictable amount of time.     Precautions / Restrictions Precautions Precautions: Fall Restrictions Weight Bearing Restrictions: No      Mobility  Bed Mobility Overal bed mobility: Independent                  Transfers Overall transfer level: Modified  independent                 General transfer comment: increased time    Ambulation/Gait Ambulation/Gait assistance: Modified independent (Device/Increase time) Gait Distance (Feet): 200 Feet Assistive device: None Gait Pattern/deviations: Step-through pattern, Drifts right/left Gait velocity: decreased     General Gait Details: able to self recover from minor LOB with head turns. No physical assist required  Stairs            Wheelchair Mobility    Modified Rankin (Stroke Patients Only)       Balance Overall balance assessment: Mild deficits observed, not formally tested                                           Pertinent Vitals/Pain Pain Assessment Pain Assessment: Faces Faces Pain Scale: No hurt Pain Intervention(s): Monitored during session    Home Living Family/patient expects to be discharged to:: Private residence Living Arrangements: Alone Available Help at Discharge: Family;Available PRN/intermittently Type of Home: Independent living facility Home Access: Level entry       Home Layout: One level Home Equipment: Grab bars - toilet;Grab bars - tub/shower (hurry-cane) Additional Comments: [facility]    Prior Function Prior Level of Function : Independent/Modified Independent             Mobility Comments: Using cane in the community but not at home ADLs Comments: ADLs. [facility] provide IADLs     Hand Dominance  Dominant Hand: Right    Extremity/Trunk Assessment   Upper Extremity Assessment Upper Extremity Assessment: Defer to OT evaluation    Lower Extremity Assessment Lower Extremity Assessment: Generalized weakness    Cervical / Trunk Assessment Cervical / Trunk Assessment: Kyphotic  Communication   Communication: No difficulties  Cognition Arousal/Alertness: Awake/alert Behavior During Therapy: WFL for tasks assessed/performed Overall Cognitive Status: Within Functional  Limits for tasks assessed                                          General Comments General comments (skin integrity, edema, etc.): Daughter present. Reviewed adaptive techniques for change in vision - difficulty reading smaller print compared to baseline. Provided hadnout for magnifying glass, etc.    Exercises     Assessment/Plan    PT Assessment Patient does not need any further PT services  PT Problem List         PT Treatment Interventions      PT Goals (Current goals can be found in the Care Plan section)  Acute Rehab PT Goals Patient Stated Goal: to go home PT Goal Formulation: All assessment and education complete, DC therapy    Frequency       Co-evaluation PT/OT/SLP Co-Evaluation/Treatment: Yes Reason for Co-Treatment: For patient/therapist safety;To address functional/ADL transfers PT goals addressed during session: Mobility/safety with mobility;Balance OT goals addressed during session: ADL's and self-care       AM-PAC PT "6 Clicks" Mobility  Outcome Measure Help needed turning from your back to your side while in a flat bed without using bedrails?: None Help needed moving from lying on your back to sitting on the side of a flat bed without using bedrails?: None Help needed moving to and from a bed to a chair (including a wheelchair)?: None Help needed standing up from a chair using your arms (e.g., wheelchair or bedside chair)?: None Help needed to walk in hospital room?: None Help needed climbing 3-5 steps with a railing? : A Little 6 Click Score: 23    End of Session   Activity Tolerance: Patient tolerated treatment well Patient left: in bed;with call bell/phone within reach;with family/visitor present Nurse Communication: Mobility status PT Visit Diagnosis: Muscle weakness (generalized) (M62.81);Unsteadiness on feet (R26.81)    Time: 0539-7673 PT Time Calculation (min) (ACUTE ONLY): 17 min   Charges:   PT Evaluation $PT Eval  Low Complexity: 1 Low          Brittony Billick A. Gilford Rile PT, DPT Acute Rehabilitation Services Office [phone]   Linna Hoff 07/23/2022, 5:31 PM

## 2022-07-23 NOTE — Progress Notes (Signed)
Pt admitted to 3W2 from [facility] at this time.  Alert and oriented x 4; denies pain or discomfort.  Family at bedside.  Telemetry placed on patient and CCMD called with second verification.  Call bell within reach, bed alarm set, and patient verbalizes understanding to call before attempting to get out of bed.

## 2022-07-23 NOTE — Consult Note (Signed)
Neurology Consultation  Reason for Consult: Stroke work-up Referring Physician: [facility]  CC: Steady gait, blurry vision, slurred speech  History is obtained from: Patient, daughters  HPI: Vanice Rappa Rosello is a 88 y.o. female with a past medical history of PMR, seronegative RA, steroid-induced osteoporosis, hypertension, hypothyroidism, TIA, staggered gait, hyperlipidemia who presented to the ED with blurry vision and balance issues. She states on Monday she noticed that her gait was off and then she discovered she couldn't read. She was traveling in a car for 5 hours and when she got out she was very unsteady.  Her daughters also state that she had some confusion getting undressed for bed at night and then redressed in the morning. She does have gait dysfunction at baseline, however it was worse on Monday.  Ms. Reh also states that she has had a hard time reading since Monday, she says she has trouble staying on the same line in the book she was reading. On Wednesday morning the came back home and went to the ED for evaluation. Of note she does have a history of hyponatremia and has seen nephrology for this in the past.  During this time she did have a bump in her creatinine and a decrease in her GFR.  Creatinine today is 0.9, BUN is 21, GFR is 60, sodium 134.     LKW: Monday tpa given?: no, outside of window Premorbid modified Rankin scale (mRS): 1   ROS: Full ROS was performed and is negative except as noted in the HPI.   Past Medical History:  Diagnosis Date   Arthritis    Carpal tunnel syndrome    Gait abnormality    has for 17yrno use of cane   Hx of TIA (transient ischemic attack) and stroke    Hyperlipidemia    Hypertension    Hypothyroidism    Osteoporosis    Wears glasses    Wears hearing aid     Family History  Problem Relation Age of Onset   Cancer Mother [phone]      breast ca   Heart attack Father    Other Father        Heavy smoker   Kidney cancer Sister     Breast cancer Sister    Breast cancer Sister    Melanoma Sister    Healthy Sister    Cancer Brother        pancreatic ca   Brain cancer Brother        astrocytoma?   Cancer Maternal Aunt     Social History:   reports that she has never smoked. She has never used smokeless tobacco. She reports current alcohol use of about 1.0 standard drink of alcohol per week. She reports that she does not use drugs.  Medications  Current Facility-Administered Medications:    [START ON 07/24/2022]  stroke: early stages of recovery book, , Does not apply, Once, WOrma Flaming MD   acetaminophen (TYLENOL) tablet 650 mg, 650 mg, Oral, Q4H PRN **OR** acetaminophen (TYLENOL) 160 MG/5ML solution 650 mg, 650 mg, Per Tube, Q4H PRN **OR** acetaminophen (TYLENOL) suppository 650 mg, 650 mg, Rectal, Q4H PRN, WOrma Flaming MD   [Derrill MemoON 07/24/2022] amLODipine (NORVASC) tablet 5 mg, 5 mg, Oral, Daily, WOrma Flaming MD   aspirin EC tablet 81 mg, 81 mg, Oral, QPM, Gardner, Jared M, DO, 81 mg at 07/22/22 2034   atorvastatin (LIPITOR) tablet 10 mg, 10 mg, Oral, Daily, WOrma Flaming MD   clopidogrel (PLAVIX) tablet  75 mg, 75 mg, Oral, QPM, Etta Quill, DO, 75 mg at 07/22/22 2034   diphenhydrAMINE (BENADRYL) tablet 12.5 mg, 12.5 mg, Oral, QHS PRN, Orma Flaming, MD   enoxaparin (LOVENOX) injection 40 mg, 40 mg, Subcutaneous, Q24H, Orma Flaming, MD   famotidine (PEPCID) tablet 20 mg, 20 mg, Oral, QHS, Orma Flaming, MD   folic acid (FOLVITE) tablet 1 mg, 1 mg, Oral, Daily, Orma Flaming, MD   iohexol (OMNIPAQUE) 350 MG/ML injection 75 mL, 75 mL, Intravenous, Once PRN, Varney Biles, MD   [START ON 07/24/2022] levothyroxine (SYNTHROID) tablet 112 mcg, 112 mcg, Oral, q morning, Orma Flaming, MD   pantoprazole (PROTONIX) EC tablet 80 mg, 80 mg, Oral, Daily, Orma Flaming, MD   predniSONE (DELTASONE) tablet 3 mg, 3 mg, Oral, Daily, Orma Flaming, MD   senna-docusate (Senokot-S) tablet 1 tablet, 1 tablet,  Oral, QHS PRN, Orma Flaming, MD  Exam: Current vital signs: BP (!) 138/59 (BP Location: Left Arm)   Pulse 86   Temp 98.3 F (36.8 C) (Oral)   Resp 18   SpO2 99%  Vital signs in last 24 hours: Temp:  [97.6 F (36.4 C)-98.8 F (37.1 C)] 98.3 F (36.8 C) (09/28 1334) Pulse Rate:  [73-88] 86 (09/28 1334) Resp:  [12-24] 18 (09/28 1334) BP: (117-160)/(51-66) 138/59 (09/28 1334) SpO2:  [96 %-100 %] 99 % (09/28 1334)  GENERAL: Awake, alert in NAD HEENT: - Normocephalic and atraumatic, dry mm, no LN++, no Thyromegally LUNGS - Clear to auscultation bilaterally with no wheezes CV - S1S2 RRR, no m/r/g, equal pulses bilaterally. ABDOMEN - Soft, nontender, nondistended with normoactive BS Ext: warm, well perfused, intact peripheral pulses, no edema  NEURO:  Mental Status: AA&Ox3  Language: speech is slight dysarthria with word finding difficulty when speaking in long sentences. Naming, repetition, fluency, and comprehension intact. Cranial Nerves: PERRL 3 mm/brisk. EOMI, visual fields full, no facial asymmetry, facial sensation intact, hearing intact, tongue/uvula/soft palate midline, normal sternocleidomastoid and trapezius muscle strength. No evidence of tongue atrophy or fibrillations Motor: 5/5 strength in all extremities.  Tone: is normal and bulk is normal Sensation- Intact to light touch bilaterally Coordination: FTN intact bilaterally, no ataxia in BLE. Gait- deferred  NIHSS components Score: Comment  1a Level of Conscious 0'[x]'$  1'[]'$  2'[]'$  3'[]'$         1b LOC Questions 0'[x]'$  1'[]'$  2'[]'$           1c LOC Commands 0'[x]'$  1'[]'$  2'[]'$           2 Best Gaze 0'[x]'$  1'[]'$  2'[]'$           3 Visual 0'[x]'$  1'[]'$  2'[]'$  3'[]'$         4 Facial Palsy 0'[x]'$  1'[]'$  2'[]'$  3'[]'$         5a Motor Arm - left 0'[x]'$  1'[]'$  2'[]'$  3'[]'$  4'[]'$  UN'[]'$     5b Motor Arm - Right 0'[x]'$  1'[]'$  2'[]'$  3'[]'$  4'[]'$  UN'[]'$     6a Motor Leg - Left 0'[x]'$  1'[]'$  2'[]'$  3'[]'$  4'[]'$  UN'[]'$     6b Motor Leg - Right 0'[x]'$  1'[]'$  2'[]'$  3'[]'$  4'[]'$  UN'[]'$     7 Limb Ataxia 0'[x]'$  1'[]'$  2'[]'$  3'[]'$  UN'[]'$       8 Sensory  0'[x]'$  1'[]'$  2'[]'$  UN'[]'$         9 Best Language 0'[x]'$  1'[]'$  2'[]'$  3'[]'$         10 Dysarthria 0'[x]'$  1'[]'$  2'[]'$  UN'[]'$         11 Extinct. and Inattention 0'[x]'$  1'[]'$  2'[]'$           TOTAL: 0  Labs I have reviewed labs in epic and the results pertinent to this consultation are:  CBC    Component Value Date/Time   WBC 9.9 07/22/2022 1637   RBC 3.90 07/22/2022 1637   HGB 11.9 (L) 07/22/2022 1637   HCT 36.2 07/22/2022 1637   PLT 251 07/22/2022 1637   MCV 92.8 07/22/2022 1637   MCH 30.5 07/22/2022 1637   MCHC 32.9 07/22/2022 1637   RDW 14.4 07/22/2022 1637   LYMPHSABS 1.4 07/22/2022 1637   MONOABS 1.1 (H) 07/22/2022 1637   EOSABS 0.2 07/22/2022 1637   BASOSABS 0.1 07/22/2022 1637    CMP     Component Value Date/Time   NA 134 (L) 07/22/2022 1637   K 3.8 07/22/2022 1637   CL 100 07/22/2022 1637   CO2 23 07/22/2022 1637   GLUCOSE 117 (H) 07/22/2022 1637   BUN 21 07/22/2022 1637   CREATININE 0.90 07/22/2022 1637   CALCIUM 9.0 07/22/2022 1637   PROT 7.0 07/22/2022 1637   ALBUMIN 4.2 07/22/2022 1637   AST 20 07/22/2022 1637   ALT 27 07/22/2022 1637   ALKPHOS 72 07/22/2022 1637   BILITOT 0.6 07/22/2022 1637   GFRNONAA 60 (L) 07/22/2022 1637   GFRAA >60 04/14/2016 1818     Imaging I have reviewed the images obtained:  CT-head- no acute abnormality  MRI brain- pending  CTA head and neck- pending   Assessment:  Stroke like symptoms   Impression: 88 y.o. female with a past medical history of PMR, seronegative RA, steroid-induced osteoporosis, hypertension, hypothyroidism, TIA, staggered gait, hyperlipidemia who presented to the ED with blurry vision and balance issues. Recommend complete stroke work up.   Recommendations: - HgbA1c, fasting lipid panel - CTA Head and Neck - Frequent neuro checks - Echocardiogram - Carotid dopplers - Prophylactic therapy-Antiplatelet med: Aspirin '81mg'$  and Plavix '75mg'$  for 3 weeks followed by single agent - Risk factor modification - Telemetry  monitoring - PT consult, OT consult, Speech consult - Stroke team to follow  Janine Ores, DNP, FNP-BC [facility] Pager: [phone]  Attending attestation: I have seen the patient and reviewed the above note.  She had abrupt onset disequilibrium and difficulty reading.  Difficulty reading that she describes sounds like it is more a problem of fixation then of dyslexia.  No EOM difficulties on my exam.  Based on the abrupt onset and the combination of visual fixation changes combined with disequilibrium, I have a very high concern for posterior circulation infarct.  It does appear to be improving, in timeframe consistent with what I would expect from a small ischemic infarct.  Would favor treating it as such for now but she will need further imaging with MRI.   Roland Rack, MD [facility] [phone]  If 7pm- 7am, please page neurology on call as listed in [facility].

## 2022-07-23 NOTE — H&P (Signed)
History and Physical    Patient: [name] VFI:[id number] DOB: Oct 10, 1927 DOA: 07/22/2022 DOS: the patient was seen and examined on 07/23/2022 PCP: [facility] @ [place]  Patient coming from:  [facility]  -Lives at [facility]. Uses cane at times.    Chief Complaint: blurry vision and balance issues.   HPI: [name] is a 88 y.o. female with medical history significant of PMR, seronegative RA, steroid induced osteoporosis, HTN, hypothyroidism, hx of TIA, history of ambulatory dysfunction, HLD who presented to ED with complaints of blurry vision and balance issues. She states on Monday she noticed that her gait was off and then she discovered she couldn't read. She was traveling in a car for 5 hours and when she got out she was very unsteady. Daughter and patient state this was different from her baseline gait dysfunction. She also had a hard time feeding herself, getting her hand to her mouth movement. She also had some confusion Monday night as well that was out of the ordinary for her. Her gait continued to get worse. She slid out of bed Monday night. She was walking all over. Her blurry vision came and went. They think the vision issue was separate from the gait issue. They came home on Wednesday and due to continued blurry vision they went to ED. Her daughter also reports that her sister noticed some slurred speech on Wednesday. No other focal deficits. No weakness.    He has been feeling good. Denies any fever/chills, headaches, chest pain or palpitations, shortness of breath or cough, abdominal pain, N/V/D, dysuria or leg swelling.    She does not smoke and drinks occasionally.   ER Course:  vitals: afebrile, bp: 137/60, HR: 88, RR: 19, oxygen: 98%RA Pertinent labs: none  CT head: no acute finding In ED: neurology consulted. Started on DAPT and [facility] asked to admit for CVA work up.    Review of Systems: As mentioned in the history of present  illness. All other systems reviewed and are negative. Past Medical History:  Diagnosis Date   Arthritis    Carpal tunnel syndrome    Gait abnormality    has for 41yrno use of cane   Hx of TIA (transient ischemic attack) and stroke    Hyperlipidemia    Hypertension    Hypothyroidism    Osteoporosis    Wears glasses    Wears hearing aid    Past Surgical History:  Procedure Laterality Date   APPENDECTOMY     CARPAL TUNNEL RELEASE Left 07/11/2013   Procedure: CARPAL TUNNEL RELEASE;  Surgeon: RCammie Sickle, MD;  Location: [facility]  Service: Orthopedics;  Laterality: Left;   COLONOSCOPY     SUBDURAL HEMATOMA EVACUATION VIA CRANIOTOMY  2007   TONSILLECTOMY     Social History:  reports that she has never smoked. She has never used smokeless tobacco. She reports current alcohol use of about 1.0 standard drink of alcohol per week. She reports that she does not use drugs.  Allergies  Allergen Reactions   Pollen Extract    Levofloxacin Anxiety    Agitation , nocturnal    Family History  Problem Relation Age of Onset   Cancer Mother 916      breast ca   Heart attack Father    Other Father        Heavy smoker   Kidney cancer Sister    Breast cancer Sister    Breast  cancer Sister    Melanoma Sister    Healthy Sister    Cancer Brother        pancreatic ca   Brain cancer Brother        astrocytoma?   Cancer Maternal Aunt     Prior to Admission medications   Medication Sig Start Date End Date Taking? Authorizing Provider  acetaminophen (TYLENOL) 500 MG tablet Take 500 mg by mouth every 6 (six) hours as needed.   Yes [provider]  amLODipine (NORVASC) 5 MG tablet TAKE ONE TABLET BY MOUTH DAILY IN AFTERNOON 04/22/22  Yes Adrian Prows, MD  aspirin EC 81 MG tablet Take 81 mg by mouth every evening.   Yes [provider]  atorvastatin (LIPITOR) 10 MG tablet Take 1 tablet (10 mg total) by mouth daily. 02/10/22  Yes Adrian Prows, MD   cholecalciferol (VITAMIN D) 1000 units tablet Take 1,000 Units by mouth daily.   Yes [provider]  cyanocobalamin 1000 MCG tablet Take 1,000 mcg by mouth as needed. Takes a couple times weekly   Yes [provider]  denosumab (PROLIA) 60 MG/ML SOSY injection Inject 60 mg into the skin every 6 (six) months.   Yes [provider]  diphenhydrAMINE (BENADRYL) 25 MG tablet Take 12.5 mg by mouth at bedtime as needed for sleep or allergies.   Yes [provider]  famotidine (PEPCID) 20 MG tablet Take 20 mg by mouth daily.   Yes [provider]  folic acid (FOLVITE) 1 MG tablet Take 1 mg by mouth daily. 03/18/21  Yes [provider]  hydrALAZINE (APRESOLINE) 25 MG tablet Take 1 tablet (25 mg total) by mouth 3 (three) times daily. 04/15/22  Yes Adrian Prows, MD  isosorbide mononitrate (IMDUR) 60 MG 24 hr tablet Take 1 tablet (60 mg total) by mouth daily. 02/10/22 02/05/23 Yes Adrian Prows, MD  levothyroxine (SYNTHROID) 112 MCG tablet Take 112 mcg by mouth every morning. 06/03/22  Yes [provider]  losartan (COZAAR) 100 MG tablet TAKE 1 TABLET ONCE DAILY. 10/25/19  Yes Crecencio Mc, MD  Methylcellulose, Laxative, (CITRUCEL PO) Take by mouth.   Yes [provider]  metoprolol succinate (TOPROL-XL) 50 MG 24 hr tablet TAKE 1 TABLET ONCE DAILY IN THE EVENING. 11/08/19  Yes Crecencio Mc, MD  Multiple Vitamins-Minerals (PRESERVISION AREDS 2 PO) Take 1 tablet by mouth 2 (two) times daily.   Yes [provider]  omeprazole (PRILOSEC) 40 MG capsule Take 1 capsule by mouth daily.   Yes [provider]  predniSONE (DELTASONE) 1 MG tablet Take 3 mg by mouth daily. 05/12/17  Yes [provider]  levothyroxine (SYNTHROID) 100 MCG tablet TAKE ONE TABLET ONCE A DAY ON AN EMPTY STOMACH. Patient needs appointment to receive further refills. Patient not taking: Reported on 07/23/2022 06/05/20   Crecencio Mc, MD  nitroGLYCERIN  (NITROSTAT) 0.4 MG SL tablet Place 1 tablet under the tongue as needed.    [provider]    Physical Exam: Vitals:   07/23/22 1215 07/23/22 1245 07/23/22 1334 07/23/22 1641  BP: (!) 155/61 (!) 156/61 (!) 138/59   Pulse: 86  86   Resp: (!) '21 19 18   '$ Temp:  97.9 F (36.6 C) 98.3 F (36.8 C)   TempSrc:  Oral Oral   SpO2: 98% 98% 99%   Weight:    62.8 kg  Height:    5' 2.5" (1.588 m)   General:  Appears calm and comfortable and  is in NAD Eyes:  PERRL, EOMI, normal lids, iris ENT:  HOH lips & tongue, mmm; appropriate dentition Neck:  no LAD, masses or thyromegaly; no carotid bruits Cardiovascular:  RRR, +systolic murmur. No LE edema.  Respiratory:   CTA bilaterally with no wheezes/rales/rhonchi.  Normal respiratory effort. Abdomen:  soft, NT, ND, NABS Back:   normal alignment, no CVAT Skin:  no rash or induration seen on limited exam Musculoskeletal:  grossly normal tone BUE/BLE, good ROM, no bony abnormality Lower extremity:  No LE edema.  Limited foot exam with no ulcerations.  2+ distal pulses. Psychiatric:  grossly normal mood and affect, speech fluent and appropriate, AOx3 Neurologic:  CN 2-12 grossly intact, moves all extremities in coordinated fashion, sensation intact. Negative pronator drift. HTK intact bilaterally FTN intact bilaterally.    Radiological Exams on Admission: Independently reviewed - see discussion in A/P where applicable  ECHOCARDIOGRAM COMPLETE  Result Date: 07/23/2022    ECHOCARDIOGRAM REPORT   Patient Name:   [name] Date of Exam: 07/23/2022 Medical Rec #:  [id number]      Height:       62.5 in Accession #:    [id number]     Weight:       143.4 lb Date of Birth:  1927/01/30       BSA:          1.670 m Patient Age:    61 years       BP:           138/59 mmHg Patient Gender: F              HR:           92 bpm. Exam Location:  Inpatient Procedure: 2D Echo Indications:    stroke  History:        Patient has prior history of Echocardiogram  examinations, most                 recent 04/15/2016. Risk Factors:Hypertension.  Sonographer:    Johny Chess RDCS Referring Phys: [id number] [place]  1. Left ventricular ejection fraction, by estimation, is 60 to 65%. The left ventricle has normal function. The left ventricle has no regional wall motion abnormalities. There is mild left ventricular hypertrophy. Left ventricular diastolic parameters are consistent with Grade I diastolic dysfunction (impaired relaxation).  2. Right ventricular systolic function is normal. The right ventricular size is normal. There is moderately elevated pulmonary artery systolic pressure.  3. Left atrial size was moderately dilated.  4. Mild to moderate mitral valve regurgitation. Mild to moderate mitral stenosis. The mean mitral valve gradient is 10.0 mmHg. Severe mitral annular calcification.  5. Tricuspid valve regurgitation is moderate.  6. Aortic valve regurgitation is trivial. Mild aortic valve stenosis.  7. The inferior vena cava is normal in size with greater than 50% respiratory variability, suggesting right atrial pressure of 3 mmHg. FINDINGS  Left Ventricle: Left ventricular ejection fraction, by estimation, is 60 to 65%. The left ventricle has normal function. The left ventricle has no regional wall motion abnormalities. The left ventricular internal cavity size was normal in size. There is  mild left ventricular hypertrophy. Left ventricular diastolic parameters are consistent with Grade I diastolic dysfunction (impaired relaxation). Right Ventricle: The right ventricular size is normal. Right ventricular systolic function is normal. There is moderately elevated pulmonary artery systolic pressure. The tricuspid regurgitant velocity is 3.46 m/s, and with an assumed right atrial pressure of 3 mmHg, the estimated right  ventricular systolic pressure is 41.3 mmHg. Left Atrium: Left atrial size was moderately dilated. Right Atrium: Right atrial size was  normal in size. Pericardium: There is no evidence of pericardial effusion. Mitral Valve: Severe mitral annular calcification. Mild to moderate mitral valve regurgitation. Mild to moderate mitral valve stenosis. MV peak gradient, 21.9 mmHg. The mean mitral valve gradient is 10.0 mmHg. Tricuspid Valve: The tricuspid valve is normal in structure. Tricuspid valve regurgitation is moderate. Aortic Valve: Aortic valve regurgitation is trivial. Aortic regurgitation PHT measures 336 msec. Mild aortic stenosis is present. Aortic valve mean gradient measures 18.0 mmHg. Aortic valve peak gradient measures 33.4 mmHg. Aortic valve area, by VTI measures 1.13 cm. Pulmonic Valve: Pulmonic valve regurgitation is not visualized. Aorta: The aortic root and ascending aorta are structurally normal, with no evidence of dilitation. Venous: The inferior vena cava is normal in size with greater than 50% respiratory variability, suggesting right atrial pressure of 3 mmHg. IAS/Shunts: No atrial level shunt detected by color flow Doppler.  LEFT VENTRICLE PLAX 2D LVIDd:         3.70 cm   Diastology LVIDs:         2.10 cm   LV e' medial:    5.11 cm/s LV PW:         1.00 cm   LV E/e' medial:  24.9 LV IVS:        1.10 cm   LV e' lateral:   5.33 cm/s LVOT diam:     1.80 cm   LV E/e' lateral: 23.8 LV SV:         55 LV SV Index:   33 LVOT Area:     2.54 cm  RIGHT VENTRICLE             IVC RV S prime:     14.70 cm/s  IVC diam: 1.30 cm TAPSE (M-mode): 2.6 cm LEFT ATRIUM           Index        RIGHT ATRIUM           Index LA diam:      2.40 cm 1.44 cm/m   RA Area:     13.40 cm LA Vol (A4C): 43.5 ml 26.05 ml/m  RA Volume:   31.70 ml  18.99 ml/m  AORTIC VALVE AV Area (Vmax):    1.10 cm AV Area (Vmean):   1.09 cm AV Area (VTI):     1.13 cm AV Vmax:           289.00 cm/s AV Vmean:          197.000 cm/s AV VTI:            0.483 m AV Peak Grad:      33.4 mmHg AV Mean Grad:      18.0 mmHg LVOT Vmax:         125.00 cm/s LVOT Vmean:        84.450 cm/s  LVOT VTI:          0.214 m LVOT/AV VTI ratio: 0.44 AI PHT:            336 msec  AORTA Ao Root diam: 2.70 cm Ao Asc diam:  2.80 cm MITRAL VALVE                TRICUSPID VALVE MV Area VTI:  1.26 cm      TR Peak grad:   47.9 mmHg MV Peak grad: 21.9 mmHg     TR  Vmax:        346.00 cm/s MV Mean grad: 10.0 mmHg MV Vmax:      2.34 m/s      SHUNTS MV Vmean:     143.0 cm/s    Systemic VTI:  0.21 m MV E velocity: 127.00 cm/s  Systemic Diam: 1.80 cm MV A velocity: 222.00 cm/s MV E/A ratio:  0.57 Phineas Inches Electronically signed by Phineas Inches Signature Date/Time: 07/23/2022/4:28:26 PM    Final    CT HEAD WO CONTRAST  Result Date: 07/22/2022 CLINICAL DATA:  Blurry vision and gait instability. EXAM: CT HEAD WITHOUT CONTRAST TECHNIQUE: Contiguous axial images were obtained from the base of the skull through the vertex without intravenous contrast. RADIATION DOSE REDUCTION: This exam was performed according to the departmental dose-optimization program which includes automated exposure control, adjustment of the mA and/or kV according to patient size and/or use of iterative reconstruction technique. COMPARISON:  MRI brain dated April 15, 2016. CT head dated April 14, 2016. FINDINGS: Brain: No evidence of acute infarction, hemorrhage, hydrocephalus, extra-axial collection or mass lesion/mass effect. Progressive mild-to-moderate generalized cerebral atrophy, within normal limits for age. Vascular: Calcified atherosclerosis at the skull base. No hyperdense vessel. Skull: Negative for fracture or focal lesion. Old left parietal burr hole again noted. Sinuses/Orbits: No acute finding. Other: None. IMPRESSION: 1. No acute intracranial abnormality. Electronically Signed   By: Titus Dubin M.D.   On: 07/22/2022 16:56    EKG: Independently reviewed.  NSR with rate 88; nonspecific ST changes with no evidence of acute ischemia   Labs on Admission: I have personally reviewed the available labs and imaging studies at the time of the  admission.  Pertinent labs:   None   Assessment and Plan: Principal Problem:   Stroke-like symptoms Active Problems:   Essential hypertension   Seronegative rheumatoid arthritis (HCC)   Acquired hypothyroidism   GERD (gastroesophageal reflux disease)    Assessment and Plan: * Stroke-like symptoms 88 year old presenting with gait abnormality and vision changes that started on Monday with hx of CVA in the past, HTN, chronic steroid use -place in observation on telemetry for TIA/stroke work-up -CT head with no acute finding -CTA head/neck, but family would like to discuss with neurology first  -Neurochecks per protocol -Neurology consulted -MRI brain without contrast  -echo  -lipid panel/A1C  -Continue daily aspirin 81 mg and start Plavix 75 mg x 3 weeks neurology recommendation -HTN control, outside of 24-48 hours  -N.p.o. until bedside swallow screen -PT/ OT/ SLP consult   Essential hypertension Blood pressure has been soft and family reports has been low at home. On multiple agents and likely has resistant HTN -start back norvasc, hold other oral meds -PRN IV hydralazine for systolic >734 -slowly add back home medication which may need to be adjusted with lower readings at home  -will need to keep log and f/u with PCP   Seronegative rheumatoid arthritis ([place]) Continue daily prednisone   Acquired hypothyroidism Check TSH Continue home synthroid   GERD (gastroesophageal reflux disease) Continue pepcid and PPI     Advance Care Planning:   Code Status: Full Code   Consults: neurology   DVT Prophylaxis: lovenox   Family Communication: daughters at bedside: Cloyde Reams and Gay Filler   Severity of Illness: The appropriate patient status for this patient is OBSERVATION. Observation status is judged to be reasonable and necessary in order to provide the required intensity of service to ensure the patient's safety. The patient's presenting symptoms, physical exam  findings, and  initial radiographic and laboratory data in the context of their medical condition is felt to place them at decreased risk for further clinical deterioration. Furthermore, it is anticipated that the patient will be medically stable for discharge from the hospital within 2 midnights of admission.   Author: Orma Flaming, MD 07/23/2022 5:01 PM  For on call review [email or web address].

## 2022-07-23 NOTE — Assessment & Plan Note (Signed)
Continue daily prednisone

## 2022-07-23 NOTE — Evaluation (Addendum)
Occupational Therapy Evaluation Patient Details Name: [name] MRN: [id number] DOB: January 11, 1927 Today's Date: 07/23/2022   History of Present Illness 88 yo female presenting to ED on 9/27 with blurry vision and balance deficits. Head CT negative for acute change. PMH including PMR, seronegative RA, steroid induced osteoporosis, HTN, hypothyroidism, hx of TIA, history of ambulatory dysfunction, HLD.   Clinical Impression   PTA, pt was living at friend's home ILF and was independent with ADLs, light IADLs, and medication management. Currently, pt performing at baseline for ADLs and functional mobility. Pt reporting difficulty reading smaller print compared to baseline; recommending use of magnifier for small print. Moment's of slight LOB with head turns during conversation and mobility; however both patient and daughter report this is normal and she has received PT for balance. Pt reports no recent falls. Answered all pt questions. Recommend dc home once medically stable per physician. All acute OT needs met and will sign off. Thank you.    Recommendations for follow up therapy are one component of a multi-disciplinary discharge planning process, led by the attending physician.  Recommendations may be updated based on patient status, additional functional criteria and insurance authorization.   Follow Up Recommendations  No OT follow up (Continue to follow up with eye doctor)    Assistance Recommended at Discharge Intermittent Supervision/Assistance  Patient can return home with the following      Functional Status Assessment  Patient has not had a recent decline in their functional status  Equipment Recommendations  None recommended by OT    Recommendations for Other Services       Precautions / Restrictions Precautions Precautions: Fall Restrictions Weight Bearing Restrictions: No      Mobility Bed Mobility Overal bed mobility: Independent                   Transfers Overall transfer level: Modified independent                 General transfer comment: increased time      Balance Overall balance assessment: Mild deficits observed, not formally tested                                         ADL either performed or assessed with clinical judgement   ADL Overall ADL's : Modified independent;At baseline                                       General ADL Comments: Increased time.     Vision Baseline Vision/History: 1 Wears glasses;6 Macular Degeneration Patient Visual Report: Other (comment) (Reports slight blurry bision. Difficulty reading fine print)       Perception     Praxis      Pertinent Vitals/Pain Pain Assessment Pain Assessment: Faces Faces Pain Scale: No hurt Pain Intervention(s): Monitored during session     Hand Dominance Right   Extremity/Trunk Assessment Upper Extremity Assessment Upper Extremity Assessment: Overall WFL for tasks assessed   Lower Extremity Assessment Lower Extremity Assessment: Generalized weakness   Cervical / Trunk Assessment Cervical / Trunk Assessment: Kyphotic   Communication Communication Communication: No difficulties   Cognition Arousal/Alertness: Awake/alert Behavior During Therapy: WFL for tasks assessed/performed Overall Cognitive Status: Within Functional Limits for tasks assessed  Slight word finding difficulties; age appropriate  General Comments  Daughter present. Reviewed adaptive techniques for change in vision - difficulty reading smaller print compared to baseline. Provided hadnout for magnifying glass, etc.    Exercises     Shoulder Instructions      Home Living Family/patient expects to be discharged to:: Private residence Living Arrangements: Alone Available Help at Discharge: Family;Available PRN/intermittently Type of Home: Independent living facility Home  Access: Level entry     Home Layout: One level     Bathroom Shower/Tub: Occupational psychologist: Handicapped height     Home Equipment: Grab bars - toilet;Grab bars - tub/shower (hurry-cane)   Additional Comments: Independent level at friends home      Prior Functioning/Environment Prior Level of Function : Independent/Modified Independent             Mobility Comments: Using cane in the community but not at home ADLs Comments: ADLs. friends home provide IADLs        OT Problem List: Impaired vision/perception;Impaired balance (sitting and/or standing)      OT Treatment/Interventions:      OT Goals(Current goals can be found in the care plan section) Acute Rehab OT Goals Patient Stated Goal: Go home OT Goal Formulation: All assessment and education complete, DC therapy  OT Frequency:      Co-evaluation PT/OT/SLP Co-Evaluation/Treatment: Yes Reason for Co-Treatment: For patient/therapist safety;To address functional/ADL transfers   OT goals addressed during session: ADL's and self-care      AM-PAC OT "6 Clicks" Daily Activity     Outcome Measure Help from another person eating meals?: None Help from another person taking care of personal grooming?: None Help from another person toileting, which includes using toliet, bedpan, or urinal?: None Help from another person bathing (including washing, rinsing, drying)?: None Help from another person to put on and taking off regular upper body clothing?: None Help from another person to put on and taking off regular lower body clothing?: None 6 Click Score: 24   End of Session Nurse Communication: Mobility status  Activity Tolerance: Patient tolerated treatment well Patient left: in bed;with call bell/phone within reach;with family/visitor present  OT Visit Diagnosis: Unsteadiness on feet (R26.81);Other abnormalities of gait and mobility (R26.89);Muscle weakness (generalized) (M62.81)                 Time: 4098-1191 OT Time Calculation (min): 18 min Charges:  OT General Charges $OT Visit: 1 Visit OT Evaluation $OT Eval Low Complexity: 1 Low  Ariel Dimitri MSOT, OTR/L Acute Rehab Office: [place] 07/23/2022, 5:26 PM

## 2022-07-23 NOTE — Assessment & Plan Note (Signed)
88 year old presenting with gait abnormality and vision changes that started on Monday with hx of CVA in the past, HTN, chronic steroid use -place in observation on telemetry for TIA/stroke work-up -CT head with no acute finding -CTA head/neck, but family would like to discuss with neurology first  -Neurochecks per protocol -Neurology consulted -MRI brain without contrast  -echo  -lipid panel/A1C  -Continue daily aspirin 81 mg and start Plavix 75 mg x 3 weeks neurology recommendation -HTN control, outside of 24-48 hours  -N.p.o. until bedside swallow screen -PT/ OT/ SLP consult

## 2022-07-24 ENCOUNTER — Encounter (HOSPITAL_COMMUNITY)
Admission: EM | Disposition: A | Discharge status: Home / Self Care | Payer: Self-pay | Source: Home / Self Care | Attending: Emergency Medicine

## 2022-07-24 DIAGNOSIS — I639 Cerebral infarction, unspecified: Secondary | ICD-10-CM

## 2022-07-24 DIAGNOSIS — R2990 Unspecified symptoms and signs involving the nervous system: Secondary | ICD-10-CM | POA: Diagnosis not present

## 2022-07-24 HISTORY — PX: LOOP RECORDER INSERTION: EP1214

## 2022-07-24 LAB — HEMOGLOBIN A1C
Hgb A1c MFr Bld: 5.9 % — ABNORMAL HIGH (ref 4.8–5.6)
Mean Plasma Glucose: 122.63 mg/dL

## 2022-07-24 LAB — LIPID PANEL
Cholesterol: 116 mg/dL (ref 0–200)
HDL: 39 mg/dL — ABNORMAL LOW (ref >40)
LDL Cholesterol: 63 mg/dL (ref 0–99)
Total CHOL/HDL Ratio: 3 RATIO
Triglycerides: 70 mg/dL (ref <150)
VLDL: 14 mg/dL (ref 0–40)

## 2022-07-24 SURGERY — LOOP RECORDER INSERTION

## 2022-07-24 MED ORDER — ASPIRIN EC 81 MG PO TBEC: 81.0000 mg | DELAYED_RELEASE_TABLET | Freq: Every evening | ORAL | 0 refills | Status: AC

## 2022-07-24 MED ORDER — LIDOCAINE-EPINEPHRINE 1 %-1:100000 IJ SOLN
INTRAMUSCULAR | Status: AC
  Filled 2022-07-24: qty 1

## 2022-07-24 MED ORDER — LIDOCAINE-EPINEPHRINE 1 %-1:100000 IJ SOLN
INTRAMUSCULAR | Status: DC | PRN
  Administered 2022-07-24: 30 mL

## 2022-07-24 MED ORDER — CLOPIDOGREL BISULFATE 75 MG PO TABS: 75.0000 mg | ORAL_TABLET | Freq: Every evening | ORAL | 0 refills | Status: AC

## 2022-07-24 SURGICAL SUPPLY — 2 items
MONITOR CARDIAC LUX-DX INSERT (Prosthesis & Implant Heart) IMPLANT
PACK LOOP INSERTION (CUSTOM PROCEDURE TRAY) ×2 IMPLANT

## 2022-07-24 NOTE — Discharge Instructions (Signed)
Post procedure wound care instructions Keep incision clean and dry for 3 days. You can remove outer dressing tomorrow. Leave steri-strips (little pieces of tape) on until seen in the office for wound check appointment. Call the office ([phone]) for redness, drainage, swelling, or fever.  

## 2022-07-24 NOTE — Discharge Summary (Signed)
Physician Discharge Summary  ADRIE PICKING KZS:[id number] DOB: [birth year] DOA: 07/22/2022  PCP: [facility]  Admit date: 07/22/2022 Discharge date: 07/24/2022    Admitted From: Home Disposition: Home  Recommendations for Outpatient Follow-up:  Follow up with PCP in 1-2 weeks Please obtain BMP/CBC in one week Follow up with neurology in 4 weeks Please follow up with your PCP on the following pending results: Unresulted Labs (From admission, onward)    None         Home Health: None Equipment/Devices: None  Discharge Condition: Stable  CODE STATUS: Full code Diet recommendation: Cardiac  Subjective: Seen and examined.  2 daughters at the bedside.  Patient has no complaints.  No focal deficit.  She walked in the hallway without any help and without feeling any dizziness and she has no visual deficit anymore.  HPI: Sundai Probert Korte is a 88 y.o. female with medical history significant of PMR, seronegative RA, steroid induced osteoporosis, HTN, hypothyroidism, hx of TIA, history of ambulatory dysfunction, HLD who presented to ED with complaints of blurry vision and balance issues. She states on Monday she noticed that her gait was off and then she discovered she couldn't read. She was traveling in a car for 5 hours and when she got out she was very unsteady. Daughter and patient state this was different from her baseline gait dysfunction. She also had a hard time feeding herself, getting her hand to her mouth movement. She also had some confusion Monday night as well that was out of the ordinary for her. Her gait continued to get worse. She slid out of bed Monday night. She was walking all over. Her blurry vision came and went. They think the vision issue was separate from the gait issue. They came home on Wednesday and due to continued blurry vision they went to ED. Her daughter also reports that her sister noticed some slurred speech on Wednesday. No other focal  deficits. No weakness.      He has been feeling good. Denies any fever/chills, headaches, chest pain or palpitations, shortness of breath or cough, abdominal pain, N/V/D, dysuria or leg swelling.      She does not smoke and drinks occasionally.    ER Course:  vitals: afebrile, bp: 137/60, HR: 88, RR: 19, oxygen: 98%RA Pertinent labs: none  CT head: no acute finding In ED: neurology consulted. Started on DAPT and TR[facility] asked to admit for CVA work up.     Brief/Interim Summary:   Acute ischemic stroke: Presented with gait abnormality and visual changes.  CT head unremarkable.  MRI brain showed Small acute infarcts in the bilateral parietal, occipital, and frontal lobes, left temporal lobe, bilateral thalami, bilateral basal ganglia, and bilateral cerebellar hemispheres, concerning for central embolic etiology.  Seen by neurology.  They have started her on DAPT, she was already on aspirin.  They recommended discharging on DAPT for 3 weeks and then Plavix alone.  Resuming home atorvastatin.  Seen by PT OT and patient did very well and they did not even recommend home health PT.  Due to embolic nature of the stroke, cardiology recommended loop recorder placement.  Cardiology has been consulted.  Loop recorder will be placed today and then patient will be discharged home in stable condition.  Blood pressure fairly controlled.  Discharge plan was discussed with patient and/or family member and they verbalized understanding and agreed with it.  Discharge Diagnoses:  Principal Problem:   Stroke-like symptoms Active Problems:  Essential hypertension   Seronegative rheumatoid arthritis ([place])   Acquired hypothyroidism   GERD (gastroesophageal reflux disease)    Discharge Instructions   Allergies as of 07/24/2022       Reactions   Pollen Extract    Levofloxacin Anxiety   Agitation , nocturnal        Medication List     TAKE these medications    acetaminophen 500 MG tablet Commonly  known as: TYLENOL Take 500 mg by mouth every 6 (six) hours as needed.   amLODipine 5 MG tablet Commonly known as: NORVASC TAKE ONE TABLET BY MOUTH DAILY IN AFTERNOON   aspirin EC 81 MG tablet Take 1 tablet (81 mg total) by mouth every evening for 21 days.   atorvastatin 10 MG tablet Commonly known as: LIPITOR Take 1 tablet (10 mg total) by mouth daily.   cholecalciferol 1000 units tablet Commonly known as: VITAMIN D Take 1,000 Units by mouth daily.   CITRUCEL PO Take by mouth.   clopidogrel 75 MG tablet Commonly known as: PLAVIX Take 1 tablet (75 mg total) by mouth every evening.   cyanocobalamin 1000 MCG tablet Take 1,000 mcg by mouth as needed. Takes a couple times weekly   denosumab 60 MG/ML Sosy injection Commonly known as: PROLIA Inject 60 mg into the skin every 6 (six) months.   diphenhydrAMINE 25 MG tablet Commonly known as: BENADRYL Take 12.5 mg by mouth at bedtime as needed for sleep or allergies.   famotidine 20 MG tablet Commonly known as: PEPCID Take 20 mg by mouth daily.   folic acid 1 MG tablet Commonly known as: FOLVITE Take 1 mg by mouth daily.   hydrALAZINE 25 MG tablet Commonly known as: APRESOLINE Take 1 tablet (25 mg total) by mouth 3 (three) times daily.   isosorbide mononitrate 60 MG 24 hr tablet Commonly known as: IMDUR Take 1 tablet (60 mg total) by mouth daily.   levothyroxine 112 MCG tablet Commonly known as: SYNTHROID Take 112 mcg by mouth every morning.   losartan 100 MG tablet Commonly known as: COZAAR TAKE 1 TABLET ONCE DAILY.   metoprolol succinate 50 MG 24 hr tablet Commonly known as: TOPROL-XL TAKE 1 TABLET ONCE DAILY IN THE EVENING.   nitroGLYCERIN 0.4 MG SL tablet Commonly known as: NITROSTAT Place 1 tablet under the tongue as needed.   omeprazole 40 MG capsule Commonly known as: PRILOSEC Take 1 capsule by mouth daily.   predniSONE 1 MG tablet Commonly known as: DELTASONE Take 3 mg by mouth daily.    PRESERVISION AREDS 2 PO Take 1 tablet by mouth 2 (two) times daily.        Follow-up Information     [facility] Follow up in 1 week(s).   Specialty: Family Medicine Contact information: [address] [place] [address] [phone]         [facility] Follow up in 4 week(s).   Contact information: [address]     [address] [place] [address] [phone]               Allergies  Allergen Reactions   Pollen Extract    Levofloxacin Anxiety    Agitation , nocturnal    Consultations: Neurology and cardiology   Procedures/Studies: MR BRAIN WO CONTRAST  Result Date: 07/23/2022 CLINICAL DATA:  Blurry vision and balance issues, stroke suspected EXAM: MRI HEAD WITHOUT CONTRAST TECHNIQUE: Multiplanar, multiecho pulse sequences of the brain and surrounding structures were  obtained without intravenous contrast. COMPARISON:  04/15/2016 FINDINGS: Brain: Restricted diffusion with ADC correlate in the right parietal lobe (series 5, images 79-88), extending from the periventricular white matter to the cortex additional larger area of restricted diffusion in the left occipital lobe cortex (series 5, images 64-70). Additional scattered smaller foci of restricted diffusion in the bilateral frontal lobes (series 5, images 80-86, 92-94), bilateral parietal lobes (series 5, image 79, 82, 84, 89-91, 93-94), left temporal lobe (series 5, image 66) bilateral occipital lobes (series 5, images 63-68, 73-77), bilateral thalami (series 5, image 76), bilateral basal ganglia (series 5, images 76 and 77), and bilateral cerebellar hemispheres (series 5, images 58-60, 64). These areas are associated with mildly increased T2 hyperintense signal. No acute hemorrhage, mass, mass effect, or midline shift. No hydrocephalus or extra-axial collection. Scattered T2 hyperintense signal in the periventricular white matter, likely the  sequela of chronic small vessel ischemic disease. Vascular: Normal arterial flow voids. Skull and upper cervical spine: Normal marrow signal. Sinuses/Orbits: No acute finding. Status post bilateral lens replacements. Other: The mastoids are well aerated. IMPRESSION: Small acute infarcts in the bilateral parietal, occipital, and frontal lobes, left temporal lobe, bilateral thalami, bilateral basal ganglia, and bilateral cerebellar hemispheres, concerning for central embolic etiology. These results will be called to the ordering clinician or representative by the Radiologist Assistant, and communication documented in the PACS or [employer]. Electronically Signed   By: Merilyn Baba M.D.   On: 07/23/2022 23:26   CT ANGIO HEAD NECK W WO CM  Result Date: 07/23/2022 CLINICAL DATA:  Blurred vision and balance difficulties EXAM: CT ANGIOGRAPHY HEAD AND NECK TECHNIQUE: Multidetector CT imaging of the head and neck was performed using the standard protocol during bolus administration of intravenous contrast. Multiplanar CT image reconstructions and MIPs were obtained to evaluate the vascular anatomy. Carotid stenosis measurements (when applicable) are obtained utilizing NASCET criteria, using the distal internal carotid diameter as the denominator. RADIATION DOSE REDUCTION: This exam was performed according to the departmental dose-optimization program which includes automated exposure control, adjustment of the mA and/or kV according to patient size and/or use of iterative reconstruction technique. CONTRAST:  14m OMNIPAQUE IOHEXOL 350 MG/ML SOLN COMPARISON:  No prior CTA; CT head 07/22/2022 FINDINGS: CT HEAD FINDINGS Brain: No evidence of acute infarct, hemorrhage, mass, mass effect, or midline shift. No hydrocephalus or extra-axial fluid collection. Periventricular white matter changes, likely the sequela of chronic small vessel ischemic disease. Cerebral volume is within normal limits for age. Vascular: No  hyperdense vessel. Atherosclerotic calcifications in the intracranial carotid and vertebral arteries. Skull: No acute fracture. Remote left parietal burr hole. Rounded right parietal calvarial growth appears unchanged going back to 2007. Sinuses/Orbits: No acute finding. Status post bilateral lens replacements. Other: The mastoid air cells are well aerated. CTA NECK FINDINGS Aortic arch: Standard branching. Imaged portion shows no evidence of aneurysm or dissection. No significant stenosis of the major arch vessel origins. Aortic atherosclerosis. Right carotid system: No evidence of dissection, occlusion, or hemodynamically significant stenosis (greater than 50%). Left carotid system: No evidence of dissection, occlusion, or hemodynamically significant stenosis (greater than 50%). Vertebral arteries: No evidence of dissection, occlusion, or hemodynamically significant stenosis (greater than 50%). Skeleton: No acute osseous abnormality. Other neck: Negative. Upper chest: Negative. Review of the MIP images confirms the above findings CTA HEAD FINDINGS Anterior circulation: Both internal carotid arteries are patent to the termini, mild calcifications but without significant stenosis. 2 mm inferiorly directed outpouching from the right ICA near the  terminus (series 7, image 104), possibly a tiny aneurysm or infundibulum at the origin of a no longer visualized right posterior communicating artery. A1 segments patent. Normal anterior communicating artery. Anterior cerebral arteries are patent to their distal aspects. No M1 stenosis or occlusion. MCA branches perfused and symmetric. Posterior circulation: Vertebral arteries patent to the vertebrobasilar junction without stenosis. Posterior inferior cerebellar arteries patent proximally. Basilar patent to its distal aspect. Superior cerebellar arteries patent proximally. Patent P1 segments. PCAs perfused to their distal aspects without stenosis. The bilateral posterior  communicating arteries are not visualized. Venous sinuses: As permitted by contrast timing, patent. Anatomic variants: None significant. Review of the MIP images confirms the above findings IMPRESSION: 1. No acute intracranial process. 2. No intracranial large vessel occlusion or significant stenosis. 3. No hemodynamically significant stenosis in the neck. 4. 2 mm inferiorly directed outpouching from the right ICA near the terminus, possibly a tiny aneurysm or infundibulum at the origin of a no longer visualized right posterior communicating artery. Electronically Signed   By: Merilyn Baba M.D.   On: 07/23/2022 20:13   ECHOCARDIOGRAM COMPLETE  Result Date: 07/23/2022    ECHOCARDIOGRAM REPORT   Patient Name:   BRITTIN BELNAP Date of Exam: 07/23/2022 Medical Rec #:  [id number]      Height:       62.5 in Accession #:    [id number]     Weight:       143.4 lb Date of Birth:  [birth year]       BSA:          1.670 m Patient Age:    59 years       BP:           138/59 mmHg Patient Gender: F              HR:           92 bpm. Exam Location:  Inpatient Procedure: 2D Echo Indications:    stroke  History:        Patient has prior history of Echocardiogram examinations, most                 recent 04/15/2016. Risk Factors:Hypertension.  Sonographer:    Johny Chess RDCS Referring Phys: [id number] [place]  1. Left ventricular ejection fraction, by estimation, is 60 to 65%. The left ventricle has normal function. The left ventricle has no regional wall motion abnormalities. There is mild left ventricular hypertrophy. Left ventricular diastolic parameters are consistent with Grade I diastolic dysfunction (impaired relaxation).  2. Right ventricular systolic function is normal. The right ventricular size is normal. There is moderately elevated pulmonary artery systolic pressure.  3. Left atrial size was moderately dilated.  4. Mild to moderate mitral valve regurgitation. Mild to moderate mitral stenosis. The mean  mitral valve gradient is 10.0 mmHg. Severe mitral annular calcification.  5. Tricuspid valve regurgitation is moderate.  6. Aortic valve regurgitation is trivial. Mild aortic valve stenosis.  7. The inferior vena cava is normal in size with greater than 50% respiratory variability, suggesting right atrial pressure of 3 mmHg. FINDINGS  Left Ventricle: Left ventricular ejection fraction, by estimation, is 60 to 65%. The left ventricle has normal function. The left ventricle has no regional wall motion abnormalities. The left ventricular internal cavity size was normal in size. There is  mild left ventricular hypertrophy. Left ventricular diastolic parameters are consistent with Grade I diastolic dysfunction (impaired relaxation). Right Ventricle: The right ventricular size is  normal. Right ventricular systolic function is normal. There is moderately elevated pulmonary artery systolic pressure. The tricuspid regurgitant velocity is 3.46 m/s, and with an assumed right atrial pressure of 3 mmHg, the estimated right ventricular systolic pressure is 86.7 mmHg. Left Atrium: Left atrial size was moderately dilated. Right Atrium: Right atrial size was normal in size. Pericardium: There is no evidence of pericardial effusion. Mitral Valve: Severe mitral annular calcification. Mild to moderate mitral valve regurgitation. Mild to moderate mitral valve stenosis. MV peak gradient, 21.9 mmHg. The mean mitral valve gradient is 10.0 mmHg. Tricuspid Valve: The tricuspid valve is normal in structure. Tricuspid valve regurgitation is moderate. Aortic Valve: Aortic valve regurgitation is trivial. Aortic regurgitation PHT measures 336 msec. Mild aortic stenosis is present. Aortic valve mean gradient measures 18.0 mmHg. Aortic valve peak gradient measures 33.4 mmHg. Aortic valve area, by VTI measures 1.13 cm. Pulmonic Valve: Pulmonic valve regurgitation is not visualized. Aorta: The aortic root and ascending aorta are structurally normal,  with no evidence of dilitation. Venous: The inferior vena cava is normal in size with greater than 50% respiratory variability, suggesting right atrial pressure of 3 mmHg. IAS/Shunts: No atrial level shunt detected by color flow Doppler.  LEFT VENTRICLE PLAX 2D LVIDd:         3.70 cm   Diastology LVIDs:         2.10 cm   LV e' medial:    5.11 cm/s LV PW:         1.00 cm   LV E/e' medial:  24.9 LV IVS:        1.10 cm   LV e' lateral:   5.33 cm/s LVOT diam:     1.80 cm   LV E/e' lateral: 23.8 LV SV:         55 LV SV Index:   33 LVOT Area:     2.54 cm  RIGHT VENTRICLE             IVC RV S prime:     14.70 cm/s  IVC diam: 1.30 cm TAPSE (M-mode): 2.6 cm LEFT ATRIUM           Index        RIGHT ATRIUM           Index LA diam:      2.40 cm 1.44 cm/m   RA Area:     13.40 cm LA Vol (A4C): 43.5 ml 26.05 ml/m  RA Volume:   31.70 ml  18.99 ml/m  AORTIC VALVE AV Area (Vmax):    1.10 cm AV Area (Vmean):   1.09 cm AV Area (VTI):     1.13 cm AV Vmax:           289.00 cm/s AV Vmean:          197.000 cm/s AV VTI:            0.483 m AV Peak Grad:      33.4 mmHg AV Mean Grad:      18.0 mmHg LVOT Vmax:         125.00 cm/s LVOT Vmean:        84.450 cm/s LVOT VTI:          0.214 m LVOT/AV VTI ratio: 0.44 AI PHT:            336 msec  AORTA Ao Root diam: 2.70 cm Ao Asc diam:  2.80 cm MITRAL VALVE  TRICUSPID VALVE MV Area VTI:  1.26 cm      TR Peak grad:   47.9 mmHg MV Peak grad: 21.9 mmHg     TR Vmax:        346.00 cm/s MV Mean grad: 10.0 mmHg MV Vmax:      2.34 m/s      SHUNTS MV Vmean:     143.0 cm/s    Systemic VTI:  0.21 m MV E velocity: 127.00 cm/s  Systemic Diam: 1.80 cm MV A velocity: 222.00 cm/s MV E/A ratio:  0.57 Phineas Inches Electronically signed by Phineas Inches Signature Date/Time: 07/23/2022/4:28:26 PM    Final    CT HEAD WO CONTRAST  Result Date: 07/22/2022 CLINICAL DATA:  Blurry vision and gait instability. EXAM: CT HEAD WITHOUT CONTRAST TECHNIQUE: Contiguous axial images were obtained from the base of  the skull through the vertex without intravenous contrast. RADIATION DOSE REDUCTION: This exam was performed according to the departmental dose-optimization program which includes automated exposure control, adjustment of the mA and/or kV according to patient size and/or use of iterative reconstruction technique. COMPARISON:  MRI brain dated April 15, 2016. CT head dated April 14, 2016. FINDINGS: Brain: No evidence of acute infarction, hemorrhage, hydrocephalus, extra-axial collection or mass lesion/mass effect. Progressive mild-to-moderate generalized cerebral atrophy, within normal limits for age. Vascular: Calcified atherosclerosis at the skull base. No hyperdense vessel. Skull: Negative for fracture or focal lesion. Old left parietal burr hole again noted. Sinuses/Orbits: No acute finding. Other: None. IMPRESSION: 1. No acute intracranial abnormality. Electronically Signed   By: Titus Dubin M.D.   On: 07/22/2022 16:56     Discharge Exam: Vitals:   07/24/22 0733 07/24/22 1208  BP: (!) 158/72 (!) 159/61  Pulse: 82 89  Resp: 18 18  Temp: 97.8 F (36.6 C) (!) 97 F (36.1 C)  SpO2: 99% 99%   Vitals:   07/23/22 2309 07/24/22 0325 07/24/22 0733 07/24/22 1208  BP: (!) 130/51 (!) 164/59 (!) 158/72 (!) 159/61  Pulse: 85 81 82 89  Resp: '17 17 18 18  '$ Temp: 98.1 F (36.7 C) (!) 97.4 F (36.3 C) 97.8 F (36.6 C) (!) 97 F (36.1 C)  TempSrc: Oral Oral Oral Axillary  SpO2: 97% 100% 99% 99%  Weight:      Height:        General: Pt is alert, awake, not in acute distress Cardiovascular: RRR, S1/S2 +, no rubs, no gallops Respiratory: CTA bilaterally, no wheezing, no rhonchi Abdominal: Soft, NT, ND, bowel sounds + Extremities: no edema, no cyanosis    The results of significant diagnostics from this hospitalization (including imaging, microbiology, ancillary and laboratory) are listed below for reference.     Microbiology: No results found for this or any previous visit (from the past 240  hour(s)).   Labs: BNP (last 3 results) No results for input(s): "BNP" in the last 8760 hours. Basic Metabolic Panel: Recent Labs  Lab 07/22/22 1637  NA 134*  K 3.8  CL 100  CO2 23  GLUCOSE 117*  BUN 21  CREATININE 0.90  CALCIUM 9.0   Liver Function Tests: Recent Labs  Lab 07/22/22 1637  AST 20  ALT 27  ALKPHOS 72  BILITOT 0.6  PROT 7.0  ALBUMIN 4.2   No results for input(s): "LIPASE", "AMYLASE" in the last 168 hours. No results for input(s): "AMMONIA" in the last 168 hours. CBC: Recent Labs  Lab 07/22/22 1637  WBC 9.9  NEUTROABS 7.2  HGB 11.9*  HCT 36.2  MCV 92.8  PLT 251   Cardiac Enzymes: No results for input(s): "CKTOTAL", "CKMB", "CKMBINDEX", "TROPONINI" in the last 168 hours. BNP: Invalid input(s): "POCBNP" CBG: Recent Labs  Lab 07/22/22 1636  GLUCAP 108*   D-Dimer No results for input(s): "DDIMER" in the last 72 hours. Hgb A1c Recent Labs    07/24/22 0434  HGBA1C 5.9*   Lipid Profile Recent Labs    07/24/22 0434  CHOL 116  HDL 39*  LDLCALC 63  TRIG 70  CHOLHDL 3.0   Thyroid function studies Recent Labs    07/23/22 1741  TSH 2.813   Anemia work up No results for input(s): "VITAMINB12", "FOLATE", "FERRITIN", "TIBC", "IRON", "RETICCTPCT" in the last 72 hours. Urinalysis    Component Value Date/Time   COLORURINE YELLOW 12/07/2020 1925   APPEARANCEUR TURBID (A) 12/07/2020 1925   APPEARANCEUR Clear 05/01/2016 1209   LABSPEC 1.019 12/07/2020 1925   PHURINE 5.0 12/07/2020 1925   GLUCOSEU NEGATIVE 12/07/2020 1925   GLUCOSEU NEGATIVE 04/17/2016 1353   HGBUR NEGATIVE 12/07/2020 1925   BILIRUBINUR NEGATIVE 12/07/2020 1925   BILIRUBINUR Negative 05/01/2016 1209   KETONESUR NEGATIVE 12/07/2020 1925   PROTEINUR NEGATIVE 12/07/2020 1925   UROBILINOGEN 0.2 04/17/2016 1404   UROBILINOGEN 0.2 04/17/2016 1353   NITRITE NEGATIVE 12/07/2020 1925   LEUKOCYTESUR MODERATE (A) 12/07/2020 1925   Sepsis Labs Recent Labs  Lab 07/22/22 1637   WBC 9.9   Microbiology No results found for this or any previous visit (from the past 240 hour(s)).   Time coordinating discharge: Over 30 minutes  SIGNED:   Darliss Cheney, MD  [facility] 07/24/2022, 1:07 PM *Please note that this is a verbal dictation therefore any spelling or grammatical errors are due to the "[employer]" system interpretation. If 7PM-7AM, please contact night-coverage [email or web address]

## 2022-07-24 NOTE — Consult Note (Signed)
ELECTROPHYSIOLOGY CONSULT NOTE  Patient ID: LEXXI KOSLOW MRN: [id number], DOB/AGE: 07/25/1927   Admit date: 07/22/2022 Date of Consult: 07/24/2022  Primary Physician: [facility] [place] Primary Cardiologist: Dr. Einar Gip Reason for Consultation: Cryptogenic stroke - recommendations regarding Implantable Loop Recorder, requested by Dr. Einar Gip  History of Present Illness Sarabella Caprio Cirrincione was admitted on 07/22/2022 with some gait instability, perhaps confusion, difficulty with tasks, intermittent blurred vision, and slurred speech.    PMHx includes: HTN, hypothyroidism, LBBB, TIA/stroke, arthritis  MRI notes: MPRESSION: Small acute infarcts in the bilateral parietal, occipital, and frontal lobes, left temporal lobe, bilateral thalami, bilateral basal ganglia, and bilateral cerebellar hemispheres, concerning for central embolic etiology.  Neurology is requesting Loop in personal discussion with Dr. Leonie Man she has undergone workup for stroke including echocardiogram and carotid angio.  The patient has been monitored on telemetry which has demonstrated sinus rhythm with no arrhythmias.   Neurology has deferred TEE  Echocardiogram this admission demonstrated.   1. Left ventricular ejection fraction, by estimation, is 60 to 65%. The  left ventricle has normal function. The left ventricle has no regional  wall motion abnormalities. There is mild left ventricular hypertrophy.  Left ventricular diastolic parameters  are consistent with Grade I diastolic dysfunction (impaired relaxation).   2. Right ventricular systolic function is normal. The right ventricular  size is normal. There is moderately elevated pulmonary artery systolic  pressure.   3. Left atrial size was moderately dilated.   4. Mild to moderate mitral valve regurgitation. Mild to moderate mitral  stenosis. The mean mitral valve gradient is 10.0 mmHg. Severe mitral  annular calcification.   5. Tricuspid valve  regurgitation is moderate.   6. Aortic valve regurgitation is trivial. Mild aortic valve stenosis.   7. The inferior vena cava is normal in size with greater than 50%  respiratory variability, suggesting right atrial pressure of 3 mmHg.     Lab work is reviewed.  Prior to admission, the patient denies chest pain, shortness of breath, dizziness, palpitations, or syncope.  They are recovering from their stroke with plans to home at discharge.      Past Medical History:  Diagnosis Date   Arthritis    Carpal tunnel syndrome    Gait abnormality    has for 89yrno use of cane   Hx of TIA (transient ischemic attack) and stroke    Hyperlipidemia    Hypertension    Hypothyroidism    Osteoporosis    Wears glasses    Wears hearing aid      Surgical History:  Past Surgical History:  Procedure Laterality Date   APPENDECTOMY     CARPAL TUNNEL RELEASE Left 07/11/2013   Procedure: CARPAL TUNNEL RELEASE;  Surgeon: RCammie Sickle, MD;  Location[facility]  Service: Orthopedics;  Laterality: Left;   COLONOSCOPY     SUBDURAL HEMATOMA EVACUATION VIA CRANIOTOMY  2007   TONSILLECTOMY       Medications Prior to Admission  Medication Sig Dispense Refill Last Dose   acetaminophen (TYLENOL) 500 MG tablet Take 500 mg by mouth every 6 (six) hours as needed.      amLODipine (NORVASC) 5 MG tablet TAKE ONE TABLET BY MOUTH DAILY IN AFTERNOON 90 tablet 2 Past Week   aspirin EC 81 MG tablet Take 81 mg by mouth every evening.   07/22/2022   atorvastatin (LIPITOR) 10 MG tablet Take 1 tablet (10 mg total) by mouth daily. 90 tablet  3 07/22/2022   cholecalciferol (VITAMIN D) 1000 units tablet Take 1,000 Units by mouth daily.   Past Week   cyanocobalamin 1000 MCG tablet Take 1,000 mcg by mouth as needed. Takes a couple times weekly   Past Week   denosumab (PROLIA) 60 MG/ML SOSY injection Inject 60 mg into the skin every 6 (six) months.      diphenhydrAMINE (BENADRYL) 25 MG tablet Take  12.5 mg by mouth at bedtime as needed for sleep or allergies.      famotidine (PEPCID) 20 MG tablet Take 20 mg by mouth daily.   Past Week   folic acid (FOLVITE) 1 MG tablet Take 1 mg by mouth daily.   Past Week   hydrALAZINE (APRESOLINE) 25 MG tablet Take 1 tablet (25 mg total) by mouth 3 (three) times daily. 90 tablet 3 07/22/2022   isosorbide mononitrate (IMDUR) 60 MG 24 hr tablet Take 1 tablet (60 mg total) by mouth daily. 90 tablet 3 Past Week   levothyroxine (SYNTHROID) 112 MCG tablet Take 112 mcg by mouth every morning.   07/22/2022   losartan (COZAAR) 100 MG tablet TAKE 1 TABLET ONCE DAILY. 90 tablet 0 07/22/2022   Methylcellulose, Laxative, (CITRUCEL PO) Take by mouth.   07/22/2022   metoprolol succinate (TOPROL-XL) 50 MG 24 hr tablet TAKE 1 TABLET ONCE DAILY IN THE EVENING. 30 tablet 0 Past Week   Multiple Vitamins-Minerals (PRESERVISION AREDS 2 PO) Take 1 tablet by mouth 2 (two) times daily.   07/22/2022   omeprazole (PRILOSEC) 40 MG capsule Take 1 capsule by mouth daily.   07/22/2022   predniSONE (DELTASONE) 1 MG tablet Take 3 mg by mouth daily.   Past Week   nitroGLYCERIN (NITROSTAT) 0.4 MG SL tablet Place 1 tablet under the tongue as needed.       Inpatient Medications:   amLODipine  5 mg Oral Daily   aspirin EC  81 mg Oral QPM   atorvastatin  10 mg Oral Daily   clopidogrel  75 mg Oral QPM   enoxaparin (LOVENOX) injection  40 mg Subcutaneous Q24H   famotidine  20 mg Oral QHS   folic acid  1 mg Oral Daily   levothyroxine  112 mcg Oral q morning   pantoprazole  80 mg Oral Daily   predniSONE  3 mg Oral Daily    Allergies:  Allergies  Allergen Reactions   Pollen Extract    Levofloxacin Anxiety    Agitation , nocturnal    Social History   Socioeconomic History   Marital status: Widowed    Spouse name: Not on file   Number of children: 4   Years of education: Not on file   Highest education level: Not on file  Occupational History   Not on file  Tobacco Use   Smoking  status: Never   Smokeless tobacco: Never  Vaping Use   Vaping Use: Never used  Substance and Sexual Activity   Alcohol use: Yes    Alcohol/week: 1.0 standard drink of alcohol    Types: 1 Glasses of wine per week    Comment: rarely   Drug use: No   Sexual activity: Not on file  Other Topics Concern   Not on file  Social History Narrative   Lives independently and 4 daughters live nearby and are supportive    Social Determinants of Health   Financial Resource Strain: Low Risk  (10/06/2018)   Overall Financial Resource Strain (CARDIA)    Difficulty of Paying Living Expenses:  Not hard at all  Food Insecurity: No Food Insecurity (10/06/2018)   Hunger Vital Sign    Worried About Running Out of Food in the Last Year: Never true    Ran Out of Food in the Last Year: Never true  Transportation Needs: No Transportation Needs (10/06/2018)   PRAPARE - Hydrologist (Medical): No    Lack of Transportation (Non-Medical): No  Physical Activity: Not on file  Stress: Not on file  Social Connections: Not on file  Intimate Partner Violence: Not on file     Family History  Problem Relation Age of Onset   Cancer Mother 47       breast ca   Heart attack Father    Other Father        Heavy smoker   Kidney cancer Sister    Breast cancer Sister    Breast cancer Sister    Melanoma Sister    Healthy Sister    Cancer Brother        pancreatic ca   Brain cancer Brother        astrocytoma?   Cancer Maternal Aunt       Review of Systems: All other systems reviewed and are otherwise negative except as noted above.  Physical Exam: Vitals:   07/23/22 2309 07/24/22 0325 07/24/22 0733 07/24/22 1208  BP: (!) 130/51 (!) 164/59 (!) 158/72 (!) 159/61  Pulse: 85 81 82 89  Resp: '17 17 18 18  '$ Temp: 98.1 F (36.7 C) (!) 97.4 F (36.3 C) 97.8 F (36.6 C) (!) 97 F (36.1 C)  TempSrc: Oral Oral Oral Axillary  SpO2: 97% 100% 99% 99%  Weight:      Height:         GEN- The patient is well appearing, alert and oriented x 3 today.   Head- normocephalic, atraumatic Eyes-  Sclera clear, conjunctiva pink Ears- hearing intact Oropharynx- clear Neck- supple Lungs- CTA b/l, normal work of breathing Heart- RRR, no murmurs, rubs or gallops  GI- soft, NT, ND Extremities- no clubbing, cyanosis, or edema MS- no significant deformity or atrophy Skin- no rash or lesion Psych- euthymic mood, full affect   Labs:   Lab Results  Component Value Date   WBC 9.9 07/22/2022   HGB 11.9 (L) 07/22/2022   HCT 36.2 07/22/2022   MCV 92.8 07/22/2022   PLT 251 07/22/2022    Recent Labs  Lab 07/22/22 1637  NA 134*  K 3.8  CL 100  CO2 23  BUN 21  CREATININE 0.90  CALCIUM 9.0  PROT 7.0  BILITOT 0.6  ALKPHOS 72  ALT 27  AST 20  GLUCOSE 117*   Lab Results  Component Value Date   TROPONINI <0.03 04/14/2016   Lab Results  Component Value Date   CHOL 116 07/24/2022   CHOL 257 (H) 11/05/2016   CHOL 152 06/10/2016   Lab Results  Component Value Date   HDL 39 (L) 07/24/2022   HDL 55.90 11/05/2016   HDL 55.10 06/10/2016   Lab Results  Component Value Date   LDLCALC 63 07/24/2022   LDLCALC 172 (H) 11/05/2016   LDLCALC 71 06/10/2016   Lab Results  Component Value Date   TRIG 70 07/24/2022   TRIG 146.0 11/05/2016   TRIG 131.0 06/10/2016   Lab Results  Component Value Date   CHOLHDL 3.0 07/24/2022   CHOLHDL 5 11/05/2016   CHOLHDL 3 06/10/2016   Lab Results  Component Value Date  LDLDIRECT 78.0 06/10/2016   LDLDIRECT 137.0 04/23/2016    No results found for: "DDIMER"   Radiology/Studies:  MR BRAIN WO CONTRAST Result Date: 07/23/2022 CLINICAL DATA:  Blurry vision and balance issues, stroke suspected EXAM: MRI HEAD WITHOUT CONTRAST TECHNIQUE: Multiplanar, multiecho pulse sequences of the brain and surrounding structures were obtained without intravenous contrast. COMPARISON:  04/15/2016 FINDINGS: Brain: Restricted diffusion with ADC  correlate in the right parietal lobe (series 5, images 79-88), extending from the periventricular white matter to the cortex additional larger area of restricted diffusion in the left occipital lobe cortex (series 5, images 64-70). Additional scattered smaller foci of restricted diffusion in the bilateral frontal lobes (series 5, images 80-86, 92-94), bilateral parietal lobes (series 5, image 79, 82, 84, 89-91, 93-94), left temporal lobe (series 5, image 66) bilateral occipital lobes (series 5, images 63-68, 73-77), bilateral thalami (series 5, image 76), bilateral basal ganglia (series 5, images 76 and 77), and bilateral cerebellar hemispheres (series 5, images 58-60, 64). These areas are associated with mildly increased T2 hyperintense signal. No acute hemorrhage, mass, mass effect, or midline shift. No hydrocephalus or extra-axial collection. Scattered T2 hyperintense signal in the periventricular white matter, likely the sequela of chronic small vessel ischemic disease. Vascular: Normal arterial flow voids. Skull and upper cervical spine: Normal marrow signal. Sinuses/Orbits: No acute finding. Status post bilateral lens replacements. Other: The mastoids are well aerated. IMPRESSION: Small acute infarcts in the bilateral parietal, occipital, and frontal lobes, left temporal lobe, bilateral thalami, bilateral basal ganglia, and bilateral cerebellar hemispheres, concerning for central embolic etiology. These results will be called to the ordering clinician or representative by the Radiologist Assistant, and communication documented in the PACS or [employer]. Electronically Signed   By: Merilyn Baba M.D.   On: 07/23/2022 23:26   CT ANGIO HEAD NECK W WO CM Result Date: 07/23/2022 CLINICAL DATA:  Blurred vision and balance difficulties EXAM: CT ANGIOGRAPHY HEAD AND NECK TECHNIQUE: Multidetector CT imaging of the head and neck was performed using the standard protocol during bolus administration of  intravenous contrast. Multiplanar CT image reconstructions and MIPs were obtained to evaluate the vascular anatomy. Carotid stenosis measurements (when applicable) are obtained utilizing NASCET criteria, using the distal internal carotid diameter as the denominator. RADIATION DOSE REDUCTION: This exam was performed according to the departmental dose-optimization program which includes automated exposure control, adjustment of the mA and/or kV according to patient size and/or use of iterative reconstruction technique. CONTRAST:  51m OMNIPAQUE IOHEXOL 350 MG/ML SOLN COMPARISON:  No prior CTA; CT head 07/22/2022 FINDINGS: CT HEAD FINDINGS Brain: No evidence of acute infarct, hemorrhage, mass, mass effect, or midline shift. No hydrocephalus or extra-axial fluid collection. Periventricular white matter changes, likely the sequela of chronic small vessel ischemic disease. Cerebral volume is within normal limits for age. Vascular: No hyperdense vessel. Atherosclerotic calcifications in the intracranial carotid and vertebral arteries. Skull: No acute fracture. Remote left parietal burr hole. Rounded right parietal calvarial growth appears unchanged going back to 2007. Sinuses/Orbits: No acute finding. Status post bilateral lens replacements. Other: The mastoid air cells are well aerated. CTA NECK FINDINGS Aortic arch: Standard branching. Imaged portion shows no evidence of aneurysm or dissection. No significant stenosis of the major arch vessel origins. Aortic atherosclerosis. Right carotid system: No evidence of dissection, occlusion, or hemodynamically significant stenosis (greater than 50%). Left carotid system: No evidence of dissection, occlusion, or hemodynamically significant stenosis (greater than 50%). Vertebral arteries: No evidence of dissection, occlusion, or hemodynamically significant stenosis (greater  than 50%). Skeleton: No acute osseous abnormality. Other neck: Negative. Upper chest: Negative. Review of  the MIP images confirms the above findings CTA HEAD FINDINGS Anterior circulation: Both internal carotid arteries are patent to the termini, mild calcifications but without significant stenosis. 2 mm inferiorly directed outpouching from the right ICA near the terminus (series 7, image 104), possibly a tiny aneurysm or infundibulum at the origin of a no longer visualized right posterior communicating artery. A1 segments patent. Normal anterior communicating artery. Anterior cerebral arteries are patent to their distal aspects. No M1 stenosis or occlusion. MCA branches perfused and symmetric. Posterior circulation: Vertebral arteries patent to the vertebrobasilar junction without stenosis. Posterior inferior cerebellar arteries patent proximally. Basilar patent to its distal aspect. Superior cerebellar arteries patent proximally. Patent P1 segments. PCAs perfused to their distal aspects without stenosis. The bilateral posterior communicating arteries are not visualized. Venous sinuses: As permitted by contrast timing, patent. Anatomic variants: None significant. Review of the MIP images confirms the above findings IMPRESSION: 1. No acute intracranial process. 2. No intracranial large vessel occlusion or significant stenosis. 3. No hemodynamically significant stenosis in the neck. 4. 2 mm inferiorly directed outpouching from the right ICA near the terminus, possibly a tiny aneurysm or infundibulum at the origin of a no longer visualized right posterior communicating artery. Electronically Signed   By: Merilyn Baba M.D.   On: 07/23/2022 20:13     CT HEAD WO CONTRAST Result Date: 07/22/2022 CLINICAL DATA:  Blurry vision and gait instability. EXAM: CT HEAD WITHOUT CONTRAST TECHNIQUE: Contiguous axial images were obtained from the base of the skull through the vertex without intravenous contrast. RADIATION DOSE REDUCTION: This exam was performed according to the departmental dose-optimization program which includes  automated exposure control, adjustment of the mA and/or kV according to patient size and/or use of iterative reconstruction technique. COMPARISON:  MRI brain dated April 15, 2016. CT head dated April 14, 2016. FINDINGS: Brain: No evidence of acute infarction, hemorrhage, hydrocephalus, extra-axial collection or mass lesion/mass effect. Progressive mild-to-moderate generalized cerebral atrophy, within normal limits for age. Vascular: Calcified atherosclerosis at the skull base. No hyperdense vessel. Skull: Negative for fracture or focal lesion. Old left parietal burr hole again noted. Sinuses/Orbits: No acute finding. Other: None. IMPRESSION: 1. No acute intracranial abnormality. Electronically Signed   By: Titus Dubin M.D.   On: 07/22/2022 16:56    12-lead ECG SR All prior EKG's in EPIC reviewed with no documented atrial fibrillation  Telemetry SR/ST, no AFib  Assessment and Plan:  1. Cryptogenic stroke The patient presents with cryptogenic stroke.    I spoke at length with the patient as well as a dighter at bedside and another on the phone about monitoring for afib with either a 30 day event monitor or an implantable loop recorder.  Risks, benefits, and alteratives to implantable loop recorder were discussed with the patient today.   At this time, the patient is very clear in their decision to proceed with implantable loop recorder.   Wound care was reviewed with the patient (keep incision clean and dry for 3 days).  Wound check follow up will be scheduled for the patient.  Please call with questions.   Lorenso Quirino Dyane Dustman, PA-C 07/24/2022

## 2022-07-24 NOTE — Evaluation (Signed)
Speech Language Pathology Evaluation Patient Details Name: [name] MRN: [id number] DOB: 1926/11/09 Today's Date: 07/24/2022 Time: 4782-9562 SLP Time Calculation (min) (ACUTE ONLY): 33 min  Problem List:  Patient Active Problem List   Diagnosis Date Noted   GERD (gastroesophageal reflux disease) 07/23/2022   Stroke-like symptoms 07/22/2022   Hyponatremia 12/08/2020   Osteoporosis, post-menopausal 08/04/2018   Acquired hypothyroidism 11/30/2017   Seronegative rheumatoid arthritis ([place]) 11/30/2017   Constipation 04/14/2017   Left anterior knee pain 11/03/2016   TIA (transient ischemic attack) 04/15/2016   Family history of breast cancer in first degree relative 03/10/2016   Osteoarthritis, shoulder 11/30/2015   Essential hypertension 11/30/2015   Vitamin D deficiency 11/30/2015   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   Age-related cognitive decline 09/09/2014   History of polymyalgia rheumatica 09/09/2014   Long-term use of high-risk medication 09/09/2014   Past Medical History:  Past Medical History:  Diagnosis Date   Arthritis    Carpal tunnel syndrome    Gait abnormality    has for 43yrno use of cane   Hx of TIA (transient ischemic attack) and stroke    Hyperlipidemia    Hypertension    Hypothyroidism    Osteoporosis    Wears glasses    Wears hearing aid    Past Surgical History:  Past Surgical History:  Procedure Laterality Date   APPENDECTOMY     CARPAL TUNNEL RELEASE Left 07/11/2013   Procedure: CARPAL TUNNEL RELEASE;  Surgeon: RCammie Sickle, MD;  [facility]  Service: Orthopedics;  Laterality: Left;   COLONOSCOPY     SUBDURAL HEMATOMA EVACUATION VIA CRANIOTOMY  2007   TONSILLECTOMY     HPI:  88yo female presenting to ED on 9/27 with blurry vision and balance deficits. Head CT negative for acute change. PMH including PMR, seronegative RA, steroid induced osteoporosis, HTN, hypothyroidism, hx of TIA, history of ambulatory  dysfunction, HLD.   Assessment / Plan / Recommendation Clinical Impression  Pt appears to be at her cognitive and communicative baseline, scoring 28/30 on the SLUMS, which is also suggestive of cognition that is [facility] Question occasional slurring of her words, but pt and family present report that her speech is at her baseline. No SLP f/u indicated at this time, although general safety precautions were discussed.    SLP Assessment  SLP Recommendation/Assessment: Patient does not need any further [facility] SLP Visit Diagnosis: Cognitive communication deficit (R41.841)    Recommendations for follow up therapy are one component of a multi-disciplinary discharge planning process, led by the attending physician.  Recommendations may be updated based on patient status, additional functional criteria and insurance authorization.    Follow Up Recommendations  No SLP follow up    Assistance Recommended at Discharge  Set up Supervision/Assistance  Functional Status Assessment Patient has not had a recent decline in their functional status  Frequency and Duration           SLP Evaluation Cognition  Overall Cognitive Status: Within Functional Limits for tasks assessed Orientation Level: Oriented X4       Comprehension  Auditory Comprehension Overall Auditory Comprehension: Appears within functional limits for tasks assessed    Expression Expression Primary Mode of Expression: Verbal Verbal Expression Overall Verbal Expression: Appears within functional limits for tasks assessed   Oral / Motor  Motor Speech Overall Motor Speech: Other (comment) (? mildly slurred but pt and family present say she is at her baseline)  Osie Bond., M.A. [place] Office [phone]  Secure chat preferred  07/24/2022, 2:01 PM

## 2022-07-24 NOTE — Progress Notes (Addendum)
STROKE TEAM PROGRESS NOTE   INTERVAL HISTORY Patient is seen in her room with multiple family members at the bedside.  Yesterday, she presented to the ED with visual disturbances (being unable to read and having blurry vision) and gait instability.  She presented outside of the window for TNK.  MRI reveals small scattered infarcts in embolic pattern.  She has left atrial enlargement and will need loop recorder placed to monitor for atrial fibrillation.  Vitals:   07/23/22 2309 07/24/22 0325 07/24/22 0733 07/24/22 1208  BP: (!) 130/51 (!) 164/59 (!) 158/72 (!) 159/61  Pulse: 85 81 82 89  Resp: '17 17 18 18  '$ Temp: 98.1 F (36.7 C) (!) 97.4 F (36.3 C) 97.8 F (36.6 C) (!) 97 F (36.1 C)  TempSrc: Oral Oral Oral Axillary  SpO2: 97% 100% 99% 99%  Weight:      Height:       CBC:  Recent Labs  Lab 07/22/22 1637  WBC 9.9  NEUTROABS 7.2  HGB 11.9*  HCT 36.2  MCV 92.8  PLT 638   Basic Metabolic Panel:  Recent Labs  Lab 07/22/22 1637  NA 134*  K 3.8  CL 100  CO2 23  GLUCOSE 117*  BUN 21  CREATININE 0.90  CALCIUM 9.0   Lipid Panel:  Recent Labs  Lab 07/24/22 0434  CHOL 116  TRIG 70  HDL 39*  CHOLHDL 3.0  VLDL 14  LDLCALC 63   HgbA1c:  Recent Labs  Lab 07/24/22 0434  HGBA1C 5.9*   Urine Drug Screen: No results for input(s): "LABOPIA", "COCAINSCRNUR", "LABBENZ", "AMPHETMU", "THCU", "LABBARB" in the last 168 hours.  Alcohol Level  Recent Labs  Lab 07/22/22 1637  ETH <10    IMAGING past 24 hours MR BRAIN WO CONTRAST  Result Date: 07/23/2022 CLINICAL DATA:  Blurry vision and balance issues, stroke suspected EXAM: MRI HEAD WITHOUT CONTRAST TECHNIQUE: Multiplanar, multiecho pulse sequences of the brain and surrounding structures were obtained without intravenous contrast. COMPARISON:  04/15/2016 FINDINGS: Brain: Restricted diffusion with ADC correlate in the right parietal lobe (series 5, images 79-88), extending from the periventricular white matter to the cortex  additional larger area of restricted diffusion in the left occipital lobe cortex (series 5, images 64-70). Additional scattered smaller foci of restricted diffusion in the bilateral frontal lobes (series 5, images 80-86, 92-94), bilateral parietal lobes (series 5, image 79, 82, 84, 89-91, 93-94), left temporal lobe (series 5, image 66) bilateral occipital lobes (series 5, images 63-68, 73-77), bilateral thalami (series 5, image 76), bilateral basal ganglia (series 5, images 76 and 77), and bilateral cerebellar hemispheres (series 5, images 58-60, 64). These areas are associated with mildly increased T2 hyperintense signal. No acute hemorrhage, mass, mass effect, or midline shift. No hydrocephalus or extra-axial collection. Scattered T2 hyperintense signal in the periventricular white matter, likely the sequela of chronic small vessel ischemic disease. Vascular: Normal arterial flow voids. Skull and upper cervical spine: Normal marrow signal. Sinuses/Orbits: No acute finding. Status post bilateral lens replacements. Other: The mastoids are well aerated. IMPRESSION: Small acute infarcts in the bilateral parietal, occipital, and frontal lobes, left temporal lobe, bilateral thalami, bilateral basal ganglia, and bilateral cerebellar hemispheres, concerning for central embolic etiology. These results will be called to the ordering clinician or representative by the Radiologist Assistant, and communication documented in the PACS or [employer]. Electronically Signed   By: Merilyn Baba M.D.   On: 07/23/2022 23:26   CT ANGIO HEAD NECK W WO CM  Result  Date: 07/23/2022 CLINICAL DATA:  Blurred vision and balance difficulties EXAM: CT ANGIOGRAPHY HEAD AND NECK TECHNIQUE: Multidetector CT imaging of the head and neck was performed using the standard protocol during bolus administration of intravenous contrast. Multiplanar CT image reconstructions and MIPs were obtained to evaluate the vascular anatomy. Carotid stenosis  measurements (when applicable) are obtained utilizing NASCET criteria, using the distal internal carotid diameter as the denominator. RADIATION DOSE REDUCTION: This exam was performed according to the departmental dose-optimization program which includes automated exposure control, adjustment of the mA and/or kV according to patient size and/or use of iterative reconstruction technique. CONTRAST:  29m OMNIPAQUE IOHEXOL 350 MG/ML SOLN COMPARISON:  No prior CTA; CT head 07/22/2022 FINDINGS: CT HEAD FINDINGS Brain: No evidence of acute infarct, hemorrhage, mass, mass effect, or midline shift. No hydrocephalus or extra-axial fluid collection. Periventricular white matter changes, likely the sequela of chronic small vessel ischemic disease. Cerebral volume is within normal limits for age. Vascular: No hyperdense vessel. Atherosclerotic calcifications in the intracranial carotid and vertebral arteries. Skull: No acute fracture. Remote left parietal burr hole. Rounded right parietal calvarial growth appears unchanged going back to 2007. Sinuses/Orbits: No acute finding. Status post bilateral lens replacements. Other: The mastoid air cells are well aerated. CTA NECK FINDINGS Aortic arch: Standard branching. Imaged portion shows no evidence of aneurysm or dissection. No significant stenosis of the major arch vessel origins. Aortic atherosclerosis. Right carotid system: No evidence of dissection, occlusion, or hemodynamically significant stenosis (greater than 50%). Left carotid system: No evidence of dissection, occlusion, or hemodynamically significant stenosis (greater than 50%). Vertebral arteries: No evidence of dissection, occlusion, or hemodynamically significant stenosis (greater than 50%). Skeleton: No acute osseous abnormality. Other neck: Negative. Upper chest: Negative. Review of the MIP images confirms the above findings CTA HEAD FINDINGS Anterior circulation: Both internal carotid arteries are patent to the  termini, mild calcifications but without significant stenosis. 2 mm inferiorly directed outpouching from the right ICA near the terminus (series 7, image 104), possibly a tiny aneurysm or infundibulum at the origin of a no longer visualized right posterior communicating artery. A1 segments patent. Normal anterior communicating artery. Anterior cerebral arteries are patent to their distal aspects. No M1 stenosis or occlusion. MCA branches perfused and symmetric. Posterior circulation: Vertebral arteries patent to the vertebrobasilar junction without stenosis. Posterior inferior cerebellar arteries patent proximally. Basilar patent to its distal aspect. Superior cerebellar arteries patent proximally. Patent P1 segments. PCAs perfused to their distal aspects without stenosis. The bilateral posterior communicating arteries are not visualized. Venous sinuses: As permitted by contrast timing, patent. Anatomic variants: None significant. Review of the MIP images confirms the above findings IMPRESSION: 1. No acute intracranial process. 2. No intracranial large vessel occlusion or significant stenosis. 3. No hemodynamically significant stenosis in the neck. 4. 2 mm inferiorly directed outpouching from the right ICA near the terminus, possibly a tiny aneurysm or infundibulum at the origin of a no longer visualized right posterior communicating artery. Electronically Signed   By: AMerilyn BabaM.D.   On: 07/23/2022 20:13   ECHOCARDIOGRAM COMPLETE  Result Date: 07/23/2022    ECHOCARDIOGRAM REPORT   Patient Name:   Mary STOPHERDate of Exam: 07/23/2022 Medical Rec #:  [id number]     Height:       62.5 in Accession #:    [id number]    Weight:       143.4 lb Date of Birth:  603/03/1927      BSA:  1.670 m Patient Age:    89 years       BP:           138/59 mmHg Patient Gender: F              HR:           92 bpm. Exam Location:  Inpatient Procedure: 2D Echo Indications:    stroke  History:        Patient has prior  history of Echocardiogram examinations, most                 recent 04/15/2016. Risk Factors:Hypertension.  Sonographer:    Johny Chess RDCS Referring Phys: [id number] [place]  1. Left ventricular ejection fraction, by estimation, is 60 to 65%. The left ventricle has normal function. The left ventricle has no regional wall motion abnormalities. There is mild left ventricular hypertrophy. Left ventricular diastolic parameters are consistent with Grade I diastolic dysfunction (impaired relaxation).  2. Right ventricular systolic function is normal. The right ventricular size is normal. There is moderately elevated pulmonary artery systolic pressure.  3. Left atrial size was moderately dilated.  4. Mild to moderate mitral valve regurgitation. Mild to moderate mitral stenosis. The mean mitral valve gradient is 10.0 mmHg. Severe mitral annular calcification.  5. Tricuspid valve regurgitation is moderate.  6. Aortic valve regurgitation is trivial. Mild aortic valve stenosis.  7. The inferior vena cava is normal in size with greater than 50% respiratory variability, suggesting right atrial pressure of 3 mmHg. FINDINGS  Left Ventricle: Left ventricular ejection fraction, by estimation, is 60 to 65%. The left ventricle has normal function. The left ventricle has no regional wall motion abnormalities. The left ventricular internal cavity size was normal in size. There is  mild left ventricular hypertrophy. Left ventricular diastolic parameters are consistent with Grade I diastolic dysfunction (impaired relaxation). Right Ventricle: The right ventricular size is normal. Right ventricular systolic function is normal. There is moderately elevated pulmonary artery systolic pressure. The tricuspid regurgitant velocity is 3.46 m/s, and with an assumed right atrial pressure of 3 mmHg, the estimated right ventricular systolic pressure is 44.0 mmHg. Left Atrium: Left atrial size was moderately dilated. Right  Atrium: Right atrial size was normal in size. Pericardium: There is no evidence of pericardial effusion. Mitral Valve: Severe mitral annular calcification. Mild to moderate mitral valve regurgitation. Mild to moderate mitral valve stenosis. MV peak gradient, 21.9 mmHg. The mean mitral valve gradient is 10.0 mmHg. Tricuspid Valve: The tricuspid valve is normal in structure. Tricuspid valve regurgitation is moderate. Aortic Valve: Aortic valve regurgitation is trivial. Aortic regurgitation PHT measures 336 msec. Mild aortic stenosis is present. Aortic valve mean gradient measures 18.0 mmHg. Aortic valve peak gradient measures 33.4 mmHg. Aortic valve area, by VTI measures 1.13 cm. Pulmonic Valve: Pulmonic valve regurgitation is not visualized. Aorta: The aortic root and ascending aorta are structurally normal, with no evidence of dilitation. Venous: The inferior vena cava is normal in size with greater than 50% respiratory variability, suggesting right atrial pressure of 3 mmHg. IAS/Shunts: No atrial level shunt detected by color flow Doppler.  LEFT VENTRICLE PLAX 2D LVIDd:         3.70 cm   Diastology LVIDs:         2.10 cm   LV e' medial:    5.11 cm/s LV PW:         1.00 cm   LV E/e' medial:  24.9 LV IVS:  1.10 cm   LV e' lateral:   5.33 cm/s LVOT diam:     1.80 cm   LV E/e' lateral: 23.8 LV SV:         55 LV SV Index:   33 LVOT Area:     2.54 cm  RIGHT VENTRICLE             IVC RV S prime:     14.70 cm/s  IVC diam: 1.30 cm TAPSE (M-mode): 2.6 cm LEFT ATRIUM           Index        RIGHT ATRIUM           Index LA diam:      2.40 cm 1.44 cm/m   RA Area:     13.40 cm LA Vol (A4C): 43.5 ml 26.05 ml/m  RA Volume:   31.70 ml  18.99 ml/m  AORTIC VALVE AV Area (Vmax):    1.10 cm AV Area (Vmean):   1.09 cm AV Area (VTI):     1.13 cm AV Vmax:           289.00 cm/s AV Vmean:          197.000 cm/s AV VTI:            0.483 m AV Peak Grad:      33.4 mmHg AV Mean Grad:      18.0 mmHg LVOT Vmax:         125.00 cm/s  LVOT Vmean:        84.450 cm/s LVOT VTI:          0.214 m LVOT/AV VTI ratio: 0.44 AI PHT:            336 msec  AORTA Ao Root diam: 2.70 cm Ao Asc diam:  2.80 cm MITRAL VALVE                TRICUSPID VALVE MV Area VTI:  1.26 cm      TR Peak grad:   47.9 mmHg MV Peak grad: 21.9 mmHg     TR Vmax:        346.00 cm/s MV Mean grad: 10.0 mmHg MV Vmax:      2.34 m/s      SHUNTS MV Vmean:     143.0 cm/s    Systemic VTI:  0.21 m MV E velocity: 127.00 cm/s  Systemic Diam: 1.80 cm MV A velocity: 222.00 cm/s MV E/A ratio:  0.57 Landscape architect signed by Phineas Inches Signature Date/Time: 07/23/2022/4:28:26 PM    Final     PHYSICAL EXAM General:  Alert, well-developed, well-nourished pleasant elderly Caucasian lady in no acute distress Respiratory:  Regular, unlabored respirations on room air  NEURO:  Mental Status: AA&Ox3  Speech/Language: speech is without dysarthria or aphasia.  Fluency, and comprehension intact.  Cranial Nerves:  II: PERRL. Visual fields full.  III, IV, VI: EOMI. Eyelids elevate symmetrically.  V: Sensation is intact to light touch and symmetrical to face.  VII: Smile is symmetrical.   VIII: hearing intact to voice. IX, X: Phonation is normal. XII: tongue is midline without fasciculations. Motor: 5/5 strength to all muscle groups tested.  Tone: is normal and bulk is normal Sensation- Intact to light touch bilaterally. Extinction absent to light touch to DSS.   Coordination: FTN intact bilaterally.No drift.  Gait- deferred   ASSESSMENT/PLAN [name] is a 88 y.o. female with history of RA, PMR, HTN, hypothyroidism, gait instability and  HLD presenting with visual disturbances (being unable to read and having blurry vision) and gait instability.  She presented outside of the window for TNK.  MRI reveals small scattered infarcts in embolic pattern.  She has left atrial enlargement and will need loop recorder placed to monitor for atrial fibrillation.  Stroke:   bilateral scattered small embolic infarcts Etiology:  likely cardioembolic  CT head No acute abnormality.  CTA head & neck No LVO or significant stenosis, 47m outpouching from right ICA near terminus MRI  small acute infarcts in bilateral parietal, occipital and frontal lobes, bilateral thalami, basal ganglia and cerebellar hemispheres 2D Echo EF 60-65%, mild LVH, grade 1 diastolic dysfunction, mild to moderate mitral stenosis, moderate tricuspid regurgitation, moderately dilated left atrium, no atrial level shunt LDL 63 HgbA1c 5.9 VTE prophylaxis - lovenox    Diet   Diet Heart Room service appropriate? Yes; Fluid consistency: Thin   aspirin 81 mg daily prior to admission, now on aspirin 81 mg daily and clopidogrel 75 mg daily for three weeks then clopidogrel alone Will need loop recorder prior to discharge to monitor for atrial fibrillation Therapy recommendations:  no PT/OT follow up Disposition:  home  Hypertension Home meds:  amlodipine 5 mg daily, hydralazine 25 mg TID, metoprolol 25 mg daily Stable Permissive hypertension (OK if < 220/120) but gradually normalize in 5-7 days Long-term BP goal normotensive  Hyperlipidemia Home meds:  atorvastatin 10 mg daily, resumed in hospital LDL 63, goal < 70 High intensity statin not indicated as LDL below goal Continue statin at discharge  Other Stroke Risk Factors Advanced Age >/= 676  Other Active Problems none  Hospital day # [place], MSN, AGACNP-BC [facility] See Amion for schedule and pager information 07/24/2022 1:55 PM   STROKE MD NOTE :  I have personally obtained history,examined this patient, reviewed notes, independently viewed imaging studies, participated in medical decision making and plan of care.ROS completed by me personally and pertinent positives fully documented  I have made any additions or clarifications directly to the above note. Agree with note above.  Patient presented with  sudden onset of blurred vision and some gait instability due to by cerebral embolic infarcts likely from strong suspicion for paroxysmal A-fib.  Continue cardiac monitoring and ongoing stroke work-up.  Aspirin Plavix for 3 weeks then Plavix alone.  Aggressive risk factor modification.  Long discussion with the patient and son and daughter at the bedside about risk benefit of anticoagulation in case A-fib is found given her advanced age.  Patient is quite independent and active with daily living and takes oral medications does not appear to be high risk for bleeding except for advanced age.  Hence recommend checking echocardiogram paroxysmal A-fib at discharge.  Follow-up with outpatient stroke clinic in 2 months.  Greater than 50% time during this 50-minute visit was spent in counseling and coordination of care about embolic stroke and discussion about stroke evaluation, prevention and treatment and answering questions.  Discussed with electrophysiology team Dr. PDoristine Bosworth PAntony Contras MD Medical Director [facility] Pager: [phone]/29/2023 2:47 PM   To contact Stroke Continuity provider, please refer to [email or web address] After hours, contact General Neurology

## 2022-07-24 NOTE — TOC Transition Note (Signed)
Transition of Care [facility]) - CM/SW Discharge Note   Patient Details  Name: [name] MRN: [id number] Date of Birth: 01/27/1927  [facility] CM/SW Contact:  Pollie Friar, RN Phone Number: 07/24/2022, 1:17 PM   Clinical Narrative:    The patient is discharging back to [facility]. No needs per TOC.   Final next level of care: Home/Self Care Barriers to Discharge: No Barriers Identified   Patient Goals and CMS Choice        Discharge Placement                       Discharge Plan and Services                                     Social Determinants of Health (SDOH) Interventions     Readmission Risk Interventions     No data to display

## 2022-07-24 NOTE — Progress Notes (Signed)
DC order reviewed with pt and her daughter. Pt will be  wheeled off unit in a moment

## 2022-07-25 DIAGNOSIS — Z23 Encounter for immunization: Secondary | ICD-10-CM | POA: Diagnosis not present

## 2022-07-27 ENCOUNTER — Telehealth: Payer: Self-pay | Admitting: Cardiology

## 2022-07-27 ENCOUNTER — Encounter (HOSPITAL_COMMUNITY): Payer: Self-pay | Admitting: Cardiology

## 2022-07-27 DIAGNOSIS — Z23 Encounter for immunization: Secondary | ICD-10-CM | POA: Diagnosis not present

## 2022-07-27 NOTE — Telephone Encounter (Signed)
  Per pt's daughter Derrill Memo called, she is requesting to call her instead for any instructions and recommendation regarding pt's ILR since pt is hard of hearing

## 2022-07-27 NOTE — Telephone Encounter (Signed)
The patient is in [place] and in [place].

## 2022-07-27 NOTE — Telephone Encounter (Signed)
Spoke to patients daughter ~[name]. Advised of Post ILR d/c instructions.   Loop Recorder Follow up   Is patient connected to [facility]?  TBD  Have steri-strips fallen off or been removed? No   Does the patient need in office follow up? No   Please continue to monitor your cardiac device site for redness, swelling, and drainage. Call the device clinic at [phone] if you experience these symptoms, fever/chills, or have questions about your device.   Remote monitoring is used to monitor your cardiac device from home. This monitoring is scheduled every month by our office. It allows [place] to keep an eye on the functioning of your device to ensure it is working properly.

## 2022-07-30 DIAGNOSIS — N1831 Chronic kidney disease, stage 3a: Secondary | ICD-10-CM | POA: Diagnosis not present

## 2022-07-30 DIAGNOSIS — M069 Rheumatoid arthritis, unspecified: Secondary | ICD-10-CM | POA: Diagnosis not present

## 2022-07-30 DIAGNOSIS — Z6827 Body mass index (BMI) 27.0-27.9, adult: Secondary | ICD-10-CM | POA: Diagnosis not present

## 2022-07-30 DIAGNOSIS — R2990 Unspecified symptoms and signs involving the nervous system: Secondary | ICD-10-CM | POA: Diagnosis not present

## 2022-07-30 DIAGNOSIS — E039 Hypothyroidism, unspecified: Secondary | ICD-10-CM | POA: Diagnosis not present

## 2022-07-30 DIAGNOSIS — I10 Essential (primary) hypertension: Secondary | ICD-10-CM | POA: Diagnosis not present

## 2022-07-31 DIAGNOSIS — M79675 Pain in left toe(s): Secondary | ICD-10-CM | POA: Diagnosis not present

## 2022-07-31 DIAGNOSIS — L600 Ingrowing nail: Secondary | ICD-10-CM | POA: Diagnosis not present

## 2022-07-31 DIAGNOSIS — B351 Tinea unguium: Secondary | ICD-10-CM | POA: Diagnosis not present

## 2022-07-31 DIAGNOSIS — M2042 Other hammer toe(s) (acquired), left foot: Secondary | ICD-10-CM | POA: Diagnosis not present

## 2022-07-31 DIAGNOSIS — M2011 Hallux valgus (acquired), right foot: Secondary | ICD-10-CM | POA: Diagnosis not present

## 2022-07-31 DIAGNOSIS — M79674 Pain in right toe(s): Secondary | ICD-10-CM | POA: Diagnosis not present

## 2022-07-31 DIAGNOSIS — M2012 Hallux valgus (acquired), left foot: Secondary | ICD-10-CM | POA: Diagnosis not present

## 2022-07-31 DIAGNOSIS — M2041 Other hammer toe(s) (acquired), right foot: Secondary | ICD-10-CM | POA: Diagnosis not present

## 2022-08-17 ENCOUNTER — Other Ambulatory Visit: Payer: Self-pay | Admitting: Cardiology

## 2022-08-17 DIAGNOSIS — I10 Essential (primary) hypertension: Secondary | ICD-10-CM

## 2022-08-18 DIAGNOSIS — E559 Vitamin D deficiency, unspecified: Secondary | ICD-10-CM | POA: Diagnosis not present

## 2022-08-18 DIAGNOSIS — M810 Age-related osteoporosis without current pathological fracture: Secondary | ICD-10-CM | POA: Diagnosis not present

## 2022-08-18 DIAGNOSIS — Z6824 Body mass index (BMI) 24.0-24.9, adult: Secondary | ICD-10-CM | POA: Diagnosis not present

## 2022-08-18 DIAGNOSIS — M1991 Primary osteoarthritis, unspecified site: Secondary | ICD-10-CM | POA: Diagnosis not present

## 2022-08-18 DIAGNOSIS — Z8739 Personal history of other diseases of the musculoskeletal system and connective tissue: Secondary | ICD-10-CM | POA: Diagnosis not present

## 2022-08-18 DIAGNOSIS — Z7952 Long term (current) use of systemic steroids: Secondary | ICD-10-CM | POA: Diagnosis not present

## 2022-08-18 DIAGNOSIS — Z840 Family history of diseases of the skin and subcutaneous tissue: Secondary | ICD-10-CM | POA: Diagnosis not present

## 2022-08-18 DIAGNOSIS — M0600 Rheumatoid arthritis without rheumatoid factor, unspecified site: Secondary | ICD-10-CM | POA: Diagnosis not present

## 2022-08-21 DIAGNOSIS — M6281 Muscle weakness (generalized): Secondary | ICD-10-CM | POA: Diagnosis not present

## 2022-08-21 DIAGNOSIS — R278 Other lack of coordination: Secondary | ICD-10-CM | POA: Diagnosis not present

## 2022-08-24 ENCOUNTER — Ambulatory Visit: Payer: Medicare Other | Attending: Cardiology

## 2022-08-24 DIAGNOSIS — I447 Left bundle-branch block, unspecified: Secondary | ICD-10-CM

## 2022-08-25 ENCOUNTER — Encounter: Payer: Self-pay | Admitting: Neurology

## 2022-08-25 ENCOUNTER — Ambulatory Visit (INDEPENDENT_AMBULATORY_CARE_PROVIDER_SITE_OTHER): Payer: Medicare Other | Admitting: Neurology

## 2022-08-25 VITALS — BP 145/75 | HR 70 | Ht 62.6 in | Wt 137.2 lb

## 2022-08-25 DIAGNOSIS — H539 Unspecified visual disturbance: Secondary | ICD-10-CM

## 2022-08-25 DIAGNOSIS — I6340 Cerebral infarction due to embolism of unspecified cerebral artery: Secondary | ICD-10-CM | POA: Diagnosis not present

## 2022-08-25 DIAGNOSIS — I209 Angina pectoris, unspecified: Secondary | ICD-10-CM | POA: Diagnosis not present

## 2022-08-25 LAB — CUP PACEART REMOTE DEVICE CHECK
Date Time Interrogation Session: 20231029083000
Implantable Pulse Generator Implant Date: 20230929
Pulse Gen Serial Number: 192101

## 2022-08-25 NOTE — Patient Instructions (Addendum)
I had a long d/w patient and her daughter about her  recent bicerebral embolioc strokes, risk for recurrent stroke/TIAs, personally independently reviewed imaging studies and stroke evaluation results and answered questions.Continue Plavix 75 mg daily  for secondary stroke prevention and maintain strict control of hypertension with blood pressure goal below 130/90, diabetes with hemoglobin A1c goal below 6.5% and lipids with LDL cholesterol goal below 70 mg/dL. I also advised the patient to eat a healthy diet with plenty of whole grains, cereals, fruits and vegetables, exercise regularly and maintain ideal body weight Followup in the future with me in 6 months or call earlier if needed.  Stroke Prevention Some medical conditions and behaviors can lead to a higher chance of having a stroke. You can help prevent a stroke by eating healthy, exercising, not smoking, and managing any medical conditions you have. Stroke is a leading cause of functional impairment. Primary prevention is particularly important because a majority of strokes are first-time events. Stroke changes the lives of not only those who experience a stroke but also their family and other caregivers. How can this condition affect me? A stroke is a medical emergency and should be treated right away. A stroke can lead to brain damage and can sometimes be life-threatening. If a person gets medical treatment right away, there is a better chance of surviving and recovering from a stroke. What can increase my risk? The following medical conditions may increase your risk of a stroke: Cardiovascular disease. High blood pressure (hypertension). Diabetes. High cholesterol. Sickle cell disease. Blood clotting disorders (hypercoagulable state). Obesity. Sleep disorders (obstructive sleep apnea). Other risk factors include: Being older than age 18. Having a history of blood clots, stroke, or mini-stroke (transient ischemic attack, TIA). Genetic  factors, such as race, ethnicity, or a family history of stroke. Smoking cigarettes or using other tobacco products. Taking birth control pills, especially if you also use tobacco. Heavy use of alcohol or drugs, especially cocaine and methamphetamine. Physical inactivity. What actions can I take to prevent this? Manage your health conditions High cholesterol levels. Eating a healthy diet is important for preventing high cholesterol. If cholesterol cannot be managed through diet alone, you may need to take medicines. Take any prescribed medicines to control your cholesterol as told by your health care provider. Hypertension. To reduce your risk of stroke, try to keep your blood pressure below 130/80. Eating a healthy diet and exercising regularly are important for controlling blood pressure. If these steps are not enough to manage your blood pressure, you may need to take medicines. Take any prescribed medicines to control hypertension as told by your health care provider. Ask your health care provider if you should monitor your blood pressure at home. Have your blood pressure checked every year, even if your blood pressure is normal. Blood pressure increases with age and some medical conditions. Diabetes. Eating a healthy diet and exercising regularly are important parts of managing your blood sugar (glucose). If your blood sugar cannot be managed through diet and exercise, you may need to take medicines. Take any prescribed medicines to control your diabetes as told by your health care provider. Get evaluated for obstructive sleep apnea. Talk to your health care provider about getting a sleep evaluation if you snore a lot or have excessive sleepiness. Make sure that any other medical conditions you have, such as atrial fibrillation or atherosclerosis, are managed. Nutrition Follow instructions from your health care provider about what to eat or drink to help manage your health  condition. These  instructions may include: Reducing your daily calorie intake. Limiting how much salt (sodium) you use to 1,500 milligrams (mg) each day. Using only healthy fats for cooking, such as olive oil, canola oil, or sunflower oil. Eating healthy foods. You can do this by: Choosing foods that are high in fiber, such as whole grains, and fresh fruits and vegetables. Eating at least 5 servings of fruits and vegetables a day. Try to fill one-half of your plate with fruits and vegetables at each meal. Choosing lean protein foods, such as lean cuts of meat, poultry without skin, fish, tofu, beans, and nuts. Eating low-fat dairy products. Avoiding foods that are high in sodium. This can help lower blood pressure. Avoiding foods that have saturated fat, trans fat, and cholesterol. This can help prevent high cholesterol. Avoiding processed and prepared foods. Counting your daily carbohydrate intake.  Lifestyle If you drink alcohol: Limit how much you have to: 0-1 drink a day for women who are not pregnant. 0-2 drinks a day for men. Know how much alcohol is in your drink. In the [place] one drink equals one 12 oz bottle of beer (339m), one 5 oz glass of wine (1472m, or one 1 oz glass of hard liquor (4482m Do not use any products that contain nicotine or tobacco. These products include cigarettes, chewing tobacco, and vaping devices, such as e-cigarettes. If you need help quitting, ask your health care provider. Avoid secondhand smoke. Do not use drugs. Activity  Try to stay at a healthy weight. Get at least 30 minutes of exercise on most days, such as: Fast walking. Biking. Swimming. Medicines Take over-the-counter and prescription medicines only as told by your health care provider. Aspirin or blood thinners (antiplatelets or anticoagulants) may be recommended to reduce your risk of forming blood clots that can lead to stroke. Avoid taking birth control pills. Talk to your health care provider about  the risks of taking birth control pills if: You are over 35 59ars old. You smoke. You get very bad headaches. You have had a blood clot. Where to find more information American Stroke Association: www.strokeassociation.org Get help right away if: You or a loved one has any symptoms of a stroke. "BE FAST" is an easy way to remember the main warning signs of a stroke: B - Balance. Signs are dizziness, sudden trouble walking, or loss of balance. E - Eyes. Signs are trouble seeing or a sudden change in vision. F - Face. Signs are sudden weakness or numbness of the face, or the face or eyelid drooping on one side. A - Arms. Signs are weakness or numbness in an arm. This happens suddenly and usually on one side of the body. S - Speech. Signs are sudden trouble speaking, slurred speech, or trouble understanding what people say. T - Time. Time to call emergency services. Write down what time symptoms started. You or a loved one has other signs of a stroke, such as: A sudden, severe headache with no known cause. Nausea or vomiting. Seizure. These symptoms may represent a serious problem that is an emergency. Do not wait to see if the symptoms will go away. Get medical help right away. Call your local emergency services (911 in the [place] Do not drive yourself to the hospital. Summary You can help to prevent a stroke by eating healthy, exercising, not smoking, limiting alcohol intake, and managing any medical conditions you may have. Do not use any products that contain nicotine or tobacco. These include cigarettes,  chewing tobacco, and vaping devices, such as e-cigarettes. If you need help quitting, ask your health care provider. Remember "BE FAST" for warning signs of a stroke. Get help right away if you or a loved one has any of these signs. This information is not intended to replace advice given to you by your health care provider. Make sure you discuss any questions you have with your health care  provider. Document Revised: 05/13/2020 Document Reviewed: 05/13/2020 Elsevier Patient Education  [place] Prevention in the Home, Adult Falls can cause injuries and can happen to people of all ages. There are many things you can do to make your home safe and to help prevent falls. Ask for help when making these changes. What actions can I take to prevent falls? General Instructions Use good lighting in all rooms. Replace any light bulbs that burn out. Turn on the lights in dark areas. Use night-lights. Keep items that you use often in easy-to-reach places. Lower the shelves around your home if needed. Set up your furniture so you have a clear path. Avoid moving your furniture around. Do not have throw rugs or other things on the floor that can make you trip. Avoid walking on wet floors. If any of your floors are uneven, fix them. Add color or contrast paint or tape to clearly mark and help you see: Grab bars or handrails. First and last steps of staircases. Where the edge of each step is. If you use a stepladder: Make sure that it is fully opened. Do not climb a closed stepladder. Make sure the sides of the stepladder are locked in place. Ask someone to hold the stepladder while you use it. Know where your pets are when moving through your home. What can I do in the bathroom?     Keep the floor dry. Clean up any water on the floor right away. Remove soap buildup in the tub or shower. Use nonskid mats or decals on the floor of the tub or shower. Attach bath mats securely with double-sided, nonslip rug tape. If you need to sit down in the shower, use a plastic, nonslip stool. Install grab bars by the toilet and in the tub and shower. Do not use towel bars as grab bars. What can I do in the bedroom? Make sure that you have a light by your bed that is easy to reach. Do not use any sheets or blankets for your bed that hang to the floor. Have a firm chair with side  arms that you can use for support when you get dressed. What can I do in the kitchen? Clean up any spills right away. If you need to reach something above you, use a step stool with a grab bar. Keep electrical cords out of the way. Do not use floor polish or wax that makes floors slippery. What can I do with my stairs? Do not leave any items on the stairs. Make sure that you have a light switch at the top and the bottom of the stairs. Make sure that there are handrails on both sides of the stairs. Fix handrails that are broken or loose. Install nonslip stair treads on all your stairs. Avoid having throw rugs at the top or bottom of the stairs. Choose a carpet that does not hide the edge of the steps on the stairs. Check carpeting to make sure that it is firmly attached to the stairs. Fix carpet that is loose or worn. What can  I do on the outside of my home? Use bright outdoor lighting. Fix the edges of walkways and driveways and fix any cracks. Remove anything that might make you trip as you walk through a door, such as a raised step or threshold. Trim any bushes or trees on paths to your home. Check to see if handrails are loose or broken and that both sides of all steps have handrails. Install guardrails along the edges of any raised decks and porches. Clear paths of anything that can make you trip, such as tools or rocks. Have leaves, snow, or ice cleared regularly. Use sand or salt on paths during winter. Clean up any spills in your garage right away. This includes grease or oil spills. What other actions can I take? Wear shoes that: Have a low heel. Do not wear high heels. Have rubber bottoms. Feel good on your feet and fit well. Are closed at the toe. Do not wear open-toe sandals. Use tools that help you move around if needed. These include: Canes. Walkers. Scooters. Crutches. Review your medicines with your doctor. Some medicines can make you feel dizzy. This can increase  your chance of falling. Ask your doctor what else you can do to help prevent falls. Where to find more information Centers for Disease Control and Prevention, STE[employer]: http://www.wolf.info/ National Institute on Aging: http://kim-miller.com/ Contact a doctor if: You are afraid of falling at home. You feel weak, drowsy, or dizzy at home. You fall at home. Summary There are many simple things that you can do to make your home safe and to help prevent falls. Ways to make your home safe include removing things that can make you trip and installing grab bars in the bathroom. Ask for help when making these changes in your home. This information is not intended to replace advice given to you by your health care provider. Make sure you discuss any questions you have with your health care provider. Document Revised: 07/14/2021 Document Reviewed: 05/15/2020 Elsevier Patient Education  [place].

## 2022-08-25 NOTE — Progress Notes (Signed)
[facility] [address] [place]. [place] [address] [phone]       OFFICE FOLLOW-UP NOTE  [name] Date of Birth:  [birth year] Medical Record Number:  [id number]   HPI: [name] is a pleasant 88 year old Caucasian lady seen today for initial office follow-up visit following hospital consultation for stroke in September 2023.  She is accompanied by her daughter today.  History is obtained from them and review of electronic medical records and I have personally reviewed pertinent available imaging films in PACS.[name] is a 88 y.o. female with a past medical history of PMR, seronegative RA, steroid-induced osteoporosis, hypertension, hypothyroidism, TIA, staggered gait, hyperlipidemia who presented to the ED on 07/23/2022 with blurry vision and balance issues.She states on Monday she noticed that her gait was off and then she discovered she couldn't read. She was traveling in a car for 5 hours and when she got out she was very unsteady.  Her daughters also stated that she had some confusion getting undressed for bed at night and then redressed in the morning. She does have gait dysfunction at baseline, however it was worse on Monday.  [name] also stated that she has had a hard time reading since Monday, she says she has trouble staying on the same line in the book she was reading. On Wednesday morning they came back home and went to the ED for evaluation.  MRI scan of the brain showed multiple small acute infarcts in the bilateral parietal, occipital and frontal lobes as well as left temporal lobes, bilateral thalami and basal ganglia and bilateral cerebellar hemispheres.  CT angiogram study of the brain and neck did not show large vessel stenosis or occlusion.  Echocardiogram showed ejection fraction 60-65%.  No cardiac source of embolism but left atrium was moderately dilated.  LDL cholesterol was 63 mg percent He was 5.9.  Patient was started on dual  antiplatelet therapy aspirin and Plavix for 3 weeks followed by Plavix alone.  He has a loop recorder inserted and so far paroxysmal A-fib has not yet been found.  Patient states she is done well having recently is much better and she can repeat in fine planes now.  Balance is also improved.  She has to be careful and hold onto things.  Uses cane and to.  She is scheduled for outpatient physical therapy.  She is tolerating Plavix well without bruising or bleeding.. She had no recurrent TIA or stroke symptoms.  She is a left-sided friend's home in independent living section.  Loop recorder report on 08/23/2022 shows no evidence of A-fib so far.    ROS:   14 system review of systems is positive for hearing loss, vision difficulties, gait difficulties, imbalance and all other systems negative  PMH:  Past Medical History:  Diagnosis Date   Arthritis    Carpal tunnel syndrome    Gait abnormality    has for 51yrno use of cane   Hx of TIA (transient ischemic attack) and stroke    Hyperlipidemia    Hypertension    Hypothyroidism    Osteoporosis    Wears glasses    Wears hearing aid     Social History:  Social History   Socioeconomic History   Marital status: Widowed    Spouse name: Not on file   Number of children: 4   Years of education: Not on file   Highest education level: Not on file  Occupational History   Not on file  Tobacco Use   Smoking status: Never   Smokeless tobacco: Never  Vaping Use   Vaping Use: Never used  Substance and Sexual Activity   Alcohol use: Yes    Alcohol/week: 1.0 standard drink of alcohol    Types: 1 Glasses of wine per week    Comment: rarely   Drug use: No   Sexual activity: Not on file  Other Topics Concern   Not on file  Social History Narrative   Lives independently and 4 daughters live nearby and are supportive    Social Determinants of Health   Financial Resource Strain: Low Risk  (10/06/2018)   Overall Financial Resource Strain  (CARDIA)    Difficulty of Paying Living Expenses: Not hard at all  Food Insecurity: No Food Insecurity (10/06/2018)   Hunger Vital Sign    Worried About Running Out of Food in the Last Year: Never true    Ran Out of Food in the Last Year: Never true  Transportation Needs: No Transportation Needs (10/06/2018)   PRAPARE - Hydrologist (Medical): No    Lack of Transportation (Non-Medical): No  Physical Activity: Not on file  Stress: Not on file  Social Connections: Not on file  Intimate Partner Violence: Not on file    Medications:   Current Outpatient Medications on File Prior to Visit  Medication Sig Dispense Refill   acetaminophen (TYLENOL) 500 MG tablet Take 500 mg by mouth every 6 (six) hours as needed.     amLODipine (NORVASC) 5 MG tablet TAKE ONE TABLET BY MOUTH DAILY IN AFTERNOON 90 tablet 2   atorvastatin (LIPITOR) 10 MG tablet Take 1 tablet (10 mg total) by mouth daily. 90 tablet 3   cholecalciferol (VITAMIN D) 1000 units tablet Take 800 Units by mouth daily.     clopidogrel (PLAVIX) 75 MG tablet Take 75 mg by mouth daily.     cyanocobalamin 1000 MCG tablet Take 1,000 mcg by mouth as needed. Takes a couple times weekly     denosumab (PROLIA) 60 MG/ML SOSY injection Inject 60 mg into the skin every 6 (six) months.     diphenhydrAMINE (BENADRYL) 25 MG tablet Take 12.5 mg by mouth at bedtime as needed for sleep or allergies.     famotidine (PEPCID) 20 MG tablet Take 20 mg by mouth daily.     folic acid (FOLVITE) 1 MG tablet Take 1 mg by mouth daily.     hydrALAZINE (APRESOLINE) 25 MG tablet Take 1 tablet (25 mg total) by mouth 3 (three) times daily. (Patient taking differently: Take 25 mg by mouth 3 (three) times daily as needed.) 90 tablet 3   isosorbide mononitrate (IMDUR) 60 MG 24 hr tablet Take 1 tablet (60 mg total) by mouth daily. 90 tablet 3   levothyroxine (SYNTHROID) 112 MCG tablet Take 112 mcg by mouth every morning.     losartan (COZAAR) 100  MG tablet TAKE 1 TABLET ONCE DAILY. 90 tablet 0   METHOTREXATE PO Take 1 tablet by mouth once a week.     Methylcellulose, Laxative, (CITRUCEL PO) Take by mouth.     metoprolol succinate (TOPROL-XL) 50 MG 24 hr tablet TAKE 1 TABLET ONCE DAILY IN THE EVENING. 30 tablet 0   Multiple Vitamins-Minerals (PRESERVISION AREDS 2 PO) Take 1 tablet by mouth 2 (two) times daily.     omeprazole (PRILOSEC) 40 MG capsule Take 1 capsule by mouth daily.     predniSONE (DELTASONE) 1 MG tablet Take 3 mg  by mouth daily.     UNABLE TO FIND Med Name: citracel     nitroGLYCERIN (NITROSTAT) 0.4 MG SL tablet Place 1 tablet under the tongue as needed. (Patient not taking: Reported on 08/25/2022)     No current facility-administered medications on file prior to visit.    Allergies:   Allergies  Allergen Reactions   Pollen Extract    Levofloxacin Anxiety    Agitation , nocturnal    Physical Exam General: well developed, well nourished pleasant elderly Caucasian lady, seated, in no evident distress Head: head normocephalic and atraumatic.  Neck: supple with no carotid or supraclavicular bruits Cardiovascular: regular rate and rhythm, no murmurs Musculoskeletal: no deformity Skin:  no rash/petichiae Vascular:  Normal pulses all extremities Vitals:   08/25/22 0924  BP: (!) 145/75  Pulse: 70   Neurologic Exam Mental Status: Awake and fully alert. Oriented to place and time. Recent and remote memory intact. Attention span, concentration and fund of knowledge appropriate. Mood and affect appropriate.  Recall 3/3.  Clock drawing 4/4.  Able to name 10 animals which can walk on 4 legs Cranial Nerves: Fundoscopic exam not done. Pupils equal, briskly reactive to light. Extraocular movements full without nystagmus. Visual fields full to confrontation. Hearing diminished bilaterally. Facial sensation intact. Face, tongue, palate moves normally and symmetrically.  Motor: Normal bulk and tone. Normal strength in all  tested extremity muscles. Sensory.: intact to touch ,pinprick .position and vibratory sensation.  Coordination: Rapid alternating movements normal in all extremities. Finger-to-nose and heel-to-shin performed accurately bilaterally. Gait and Station: Arises from chair without difficulty. Stance is slightly stooped.  Gait is cautious and slightly broad-based. Reflexes: 1+ and symmetric. Toes downgoing.   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  Vascular risk factors of hypertension, hyperlipidemia and age.     PLAN:I had a long d/w patient and her daughter about her  recent bicerebral embolioc strokes, risk for recurrent stroke/TIAs, personally independently reviewed imaging studies and stroke evaluation results and answered questions.Continue Plavix 75 mg daily  for secondary stroke prevention and maintain strict control of hypertension with blood pressure goal below 130/90, diabetes with hemoglobin A1c goal below 6.5% and lipids with LDL cholesterol goal below 70 mg/dL. I also advised the patient to eat a healthy diet with plenty of whole grains, cereals, fruits and vegetables, exercise regularly and maintain ideal body weight Followup in the future with me in 6 months or call earlier if needed..Greater than 50% of time during this 35 minute visit was spent on counseling,explanation of diagnosis, planning of further management, discussion with patient and family and coordination of care Antony Contras, MD Note: This document was prepared with digital dictation and possible smart phrase technology. Any transcriptional errors that result from this process are unintentional

## 2022-08-27 DIAGNOSIS — M25671 Stiffness of right ankle, not elsewhere classified: Secondary | ICD-10-CM | POA: Diagnosis not present

## 2022-08-27 DIAGNOSIS — R293 Abnormal posture: Secondary | ICD-10-CM | POA: Diagnosis not present

## 2022-08-27 DIAGNOSIS — R262 Difficulty in walking, not elsewhere classified: Secondary | ICD-10-CM | POA: Diagnosis not present

## 2022-09-03 ENCOUNTER — Telehealth: Payer: Self-pay

## 2022-09-03 NOTE — Telephone Encounter (Signed)
[employer] Alert  ILR alert report received. Battery status OK. Normal device function. No new symptom,  brady, or pause episodes. No new AF episodes. There were 7 false tachy episodes detected that were T wave oversensing, ? programming, sent to triage.  Monthly summary reports and ROV/PRN.  Reached out to [place] from [employer] and the following recommendations/changes were made. Routing to Dr. Quentin Ore to advise.  09/03/22: Joey changed blanking from 160 to 200 and sensitivity from 0.037 to .1 after discussion with [employer].  Possible that this may not fix the issue due to high T waves but will allow [place] to review if happens again.

## 2022-09-07 ENCOUNTER — Other Ambulatory Visit: Payer: Self-pay | Admitting: Cardiology

## 2022-09-07 DIAGNOSIS — I10 Essential (primary) hypertension: Secondary | ICD-10-CM

## 2022-09-21 DIAGNOSIS — K219 Gastro-esophageal reflux disease without esophagitis: Secondary | ICD-10-CM | POA: Diagnosis not present

## 2022-09-25 DIAGNOSIS — D649 Anemia, unspecified: Secondary | ICD-10-CM | POA: Diagnosis not present

## 2022-09-26 NOTE — Progress Notes (Signed)
[facility] Summary Report / Loop Recorder 

## 2022-09-28 ENCOUNTER — Ambulatory Visit (INDEPENDENT_AMBULATORY_CARE_PROVIDER_SITE_OTHER): Payer: Medicare Other

## 2022-09-28 DIAGNOSIS — I447 Left bundle-branch block, unspecified: Secondary | ICD-10-CM | POA: Diagnosis not present

## 2022-09-28 LAB — CUP PACEART REMOTE DEVICE CHECK
Date Time Interrogation Session: 20231204023800
Implantable Pulse Generator Implant Date: 20230929
Pulse Gen Serial Number: 192101

## 2022-10-16 DIAGNOSIS — E039 Hypothyroidism, unspecified: Secondary | ICD-10-CM | POA: Diagnosis not present

## 2022-10-16 DIAGNOSIS — I10 Essential (primary) hypertension: Secondary | ICD-10-CM | POA: Diagnosis not present

## 2022-10-16 DIAGNOSIS — N1831 Chronic kidney disease, stage 3a: Secondary | ICD-10-CM | POA: Diagnosis not present

## 2022-10-16 DIAGNOSIS — M069 Rheumatoid arthritis, unspecified: Secondary | ICD-10-CM | POA: Diagnosis not present

## 2022-10-16 DIAGNOSIS — K219 Gastro-esophageal reflux disease without esophagitis: Secondary | ICD-10-CM | POA: Diagnosis not present

## 2022-10-16 DIAGNOSIS — M810 Age-related osteoporosis without current pathological fracture: Secondary | ICD-10-CM | POA: Diagnosis not present

## 2022-10-22 DIAGNOSIS — M810 Age-related osteoporosis without current pathological fracture: Secondary | ICD-10-CM | POA: Diagnosis not present

## 2022-10-29 ENCOUNTER — Ambulatory Visit (INDEPENDENT_AMBULATORY_CARE_PROVIDER_SITE_OTHER): Payer: Medicare Other

## 2022-10-29 DIAGNOSIS — I447 Left bundle-branch block, unspecified: Secondary | ICD-10-CM | POA: Diagnosis not present

## 2022-10-29 LAB — CUP PACEART REMOTE DEVICE CHECK
Date Time Interrogation Session: 20240104064400
Implantable Pulse Generator Implant Date: 20230929
Pulse Gen Serial Number: 192101

## 2022-11-04 ENCOUNTER — Other Ambulatory Visit: Payer: Self-pay | Admitting: *Deleted

## 2022-11-04 DIAGNOSIS — E611 Iron deficiency: Secondary | ICD-10-CM

## 2022-11-05 ENCOUNTER — Other Ambulatory Visit: Payer: Self-pay | Admitting: Cardiology

## 2022-11-05 DIAGNOSIS — I209 Angina pectoris, unspecified: Secondary | ICD-10-CM

## 2022-11-05 NOTE — Progress Notes (Signed)
[place] Loop Recorder  

## 2022-11-09 ENCOUNTER — Inpatient Hospital Stay: Payer: Medicare Other | Attending: Oncology

## 2022-11-09 ENCOUNTER — Inpatient Hospital Stay (HOSPITAL_BASED_OUTPATIENT_CLINIC_OR_DEPARTMENT_OTHER): Payer: Medicare Other | Admitting: Nurse Practitioner

## 2022-11-09 ENCOUNTER — Encounter: Payer: Self-pay | Admitting: Nurse Practitioner

## 2022-11-09 ENCOUNTER — Other Ambulatory Visit (HOSPITAL_BASED_OUTPATIENT_CLINIC_OR_DEPARTMENT_OTHER): Payer: Self-pay

## 2022-11-09 VITALS — BP 136/63 | HR 81 | Temp 98.1°F | Resp 18 | Ht 62.6 in | Wt 139.4 lb

## 2022-11-09 DIAGNOSIS — E785 Hyperlipidemia, unspecified: Secondary | ICD-10-CM | POA: Insufficient documentation

## 2022-11-09 DIAGNOSIS — Z7952 Long term (current) use of systemic steroids: Secondary | ICD-10-CM | POA: Insufficient documentation

## 2022-11-09 DIAGNOSIS — D649 Anemia, unspecified: Secondary | ICD-10-CM

## 2022-11-09 DIAGNOSIS — R0609 Other forms of dyspnea: Secondary | ICD-10-CM | POA: Insufficient documentation

## 2022-11-09 DIAGNOSIS — Z79631 Long term (current) use of antimetabolite agent: Secondary | ICD-10-CM | POA: Diagnosis not present

## 2022-11-09 DIAGNOSIS — E611 Iron deficiency: Secondary | ICD-10-CM

## 2022-11-09 DIAGNOSIS — Z79899 Other long term (current) drug therapy: Secondary | ICD-10-CM | POA: Diagnosis not present

## 2022-11-09 DIAGNOSIS — I10 Essential (primary) hypertension: Secondary | ICD-10-CM | POA: Diagnosis not present

## 2022-11-09 DIAGNOSIS — M069 Rheumatoid arthritis, unspecified: Secondary | ICD-10-CM | POA: Diagnosis not present

## 2022-11-09 DIAGNOSIS — E039 Hypothyroidism, unspecified: Secondary | ICD-10-CM | POA: Diagnosis not present

## 2022-11-09 DIAGNOSIS — Z7902 Long term (current) use of antithrombotics/antiplatelets: Secondary | ICD-10-CM | POA: Insufficient documentation

## 2022-11-09 LAB — CBC WITH DIFFERENTIAL (CANCER CENTER ONLY)
Abs Immature Granulocytes: 0.05 10*3/uL (ref 0.00–0.07)
Basophils Absolute: 0.1 10*3/uL (ref 0.0–0.1)
Basophils Relative: 1 %
Eosinophils Absolute: 0.2 10*3/uL (ref 0.0–0.5)
Eosinophils Relative: 2 %
HCT: 35.3 % — ABNORMAL LOW (ref 36.0–46.0)
Hemoglobin: 11.2 g/dL — ABNORMAL LOW (ref 12.0–15.0)
Immature Granulocytes: 1 %
Lymphocytes Relative: 22 %
Lymphs Abs: 2.2 10*3/uL (ref 0.7–4.0)
MCH: 30.5 pg (ref 26.0–34.0)
MCHC: 31.7 g/dL (ref 30.0–36.0)
MCV: 96.2 fL (ref 80.0–100.0)
Monocytes Absolute: 0.8 10*3/uL (ref 0.1–1.0)
Monocytes Relative: 9 %
Neutro Abs: 6.3 10*3/uL (ref 1.7–7.7)
Neutrophils Relative %: 65 %
Platelet Count: 300 10*3/uL (ref 150–400)
RBC: 3.67 MIL/uL — ABNORMAL LOW (ref 3.87–5.11)
RDW: 15 % (ref 11.5–15.5)
WBC Count: 9.7 10*3/uL (ref 4.0–10.5)
nRBC: 0 % (ref 0.0–0.2)

## 2022-11-09 LAB — CMP (CANCER CENTER ONLY)
ALT: 30 U/L (ref 0–44)
AST: 22 U/L (ref 15–41)
Albumin: 4.4 g/dL (ref 3.5–5.0)
Alkaline Phosphatase: 62 U/L (ref 38–126)
Anion gap: 9 (ref 5–15)
BUN: 19 mg/dL (ref 8–23)
CO2: 27 mmol/L (ref 22–32)
Calcium: 9.2 mg/dL (ref 8.9–10.3)
Chloride: 103 mmol/L (ref 98–111)
Creatinine: 1.09 mg/dL — ABNORMAL HIGH (ref 0.44–1.00)
GFR, Estimated: 47 mL/min — ABNORMAL LOW (ref >60)
Glucose, Bld: 99 mg/dL (ref 70–99)
Potassium: 4.3 mmol/L (ref 3.5–5.1)
Sodium: 139 mmol/L (ref 135–145)
Total Bilirubin: 0.4 mg/dL (ref 0.3–1.2)
Total Protein: 6.4 g/dL — ABNORMAL LOW (ref 6.5–8.1)

## 2022-11-09 LAB — RETIC PANEL
Immature Retic Fract: 15.9 % (ref 2.3–15.9)
RBC.: 3.7 MIL/uL — ABNORMAL LOW (ref 3.87–5.11)
Retic Count, Absolute: 47 10*3/uL (ref 19.0–186.0)
Retic Ct Pct: 1.3 % (ref 0.4–3.1)
Reticulocyte Hemoglobin: 36.3 pg (ref >27.9)

## 2022-11-09 LAB — SAVE SMEAR(SSMR), FOR PROVIDER SLIDE REVIEW

## 2022-11-09 LAB — FERRITIN: Ferritin: 19 ng/mL (ref 11–307)

## 2022-11-09 NOTE — Progress Notes (Signed)
New Hematology/Oncology Consult   Requesting MD: Marda Stalker, [place]      Reason for Consult: Low iron levels  HPI: [name] is a 89 year old woman referred for evaluation of low iron levels.  She was seen by Marda Stalker, PA 07/30/2022 following hospitalization 07/22/2022 with a new diagnosis of acute ischemic stroke.  CBC showed hemoglobin 11.3, MCV 93, white count 6.7, platelet count 319,000, creatinine normal at 0.9; repeat CBC 09/25/2022 returned with hemoglobin 10.9, MCV 91, white count 7.6, platelet count 269,000.  Unfortunately no iron studies were included with the referral data.  Ms. Sabel is accompanied by her daughter.  She was able to access lab results.  Most recent iron studies are from 08/14/2021 at which time ferritin/other iron studies were in normal range.  Ms. Tal is not aware of any bleeding.  She feels good overall.  She notes dyspnea on exertion.  No fevers or sweats.  She does not crave ice.  She eats a regular diet.  She thinks last colonoscopy was about 30 years ago.  She has rheumatoid arthritis, reports methotrexate dose was increased 4 to 5 months ago.  She had a stroke September of last year.  She has been maintained on Plavix since the end of September 2023.   Past Medical History:  Diagnosis Date   Arthritis    Carpal tunnel syndrome    Gait abnormality    has for 40yrno use of cane   Hx of TIA (transient ischemic attack) and stroke    Hyperlipidemia    Hypertension    Hypothyroidism    Osteoporosis    Wears glasses    Wears hearing aid      Past Surgical History:  Procedure Laterality Date   APPENDECTOMY     CARPAL TUNNEL RELEASE Left 07/11/2013   Procedure: CARPAL TUNNEL RELEASE;  Surgeon: RCammie Sickle, MD;  [facility]  Service: Orthopedics;  Laterality: Left;   COLONOSCOPY     LOOP RECORDER INSERTION N/A 07/24/2022   Procedure: LOOP RECORDER INSERTION;  Surgeon: LVickie Epley  MD;  [facility];  Service: Cardiovascular;  Laterality: N/A;   SUBDURAL HEMATOMA EVACUATION VIA CRANIOTOMY  2007   TONSILLECTOMY       Current Outpatient Medications:    acetaminophen (TYLENOL) 500 MG tablet, Take 500 mg by mouth every 6 (six) hours as needed., Disp: , Rfl:    amLODipine (NORVASC) 5 MG tablet, TAKE ONE TABLET BY MOUTH DAILY IN AFTERNOON, Disp: 90 tablet, Rfl: 2   atorvastatin (LIPITOR) 10 MG tablet, Take 1 tablet (10 mg total) by mouth daily., Disp: 90 tablet, Rfl: 3   cholecalciferol (VITAMIN D) 1000 units tablet, Take 800 Units by mouth daily., Disp: , Rfl:    clopidogrel (PLAVIX) 75 MG tablet, Take 75 mg by mouth daily., Disp: , Rfl:    cyanocobalamin 1000 MCG tablet, Take 1,000 mcg by mouth as needed. Takes a couple times weekly, Disp: , Rfl:    denosumab (PROLIA) 60 MG/ML SOSY injection, Inject 60 mg into the skin every 6 (six) months., Disp: , Rfl:    diphenhydrAMINE (BENADRYL) 25 MG tablet, Take 12.5 mg by mouth at bedtime as needed for sleep or allergies., Disp: , Rfl:    famotidine (PEPCID) 20 MG tablet, Take 40 mg by mouth daily., Disp: , Rfl:    folic acid (FOLVITE) 1 MG tablet, Take 1 mg by mouth daily., Disp: , Rfl:    hydrALAZINE (APRESOLINE)  25 MG tablet, Take 1 tablet (25 mg total) by mouth 3 (three) times daily., Disp: 90 tablet, Rfl: 3   isosorbide mononitrate (IMDUR) 60 MG 24 hr tablet, Take 1 tablet (60 mg total) by mouth daily., Disp: 90 tablet, Rfl: 3   levothyroxine (SYNTHROID) 112 MCG tablet, Take 112 mcg by mouth every morning., Disp: , Rfl:    losartan (COZAAR) 100 MG tablet, TAKE 1 TABLET ONCE DAILY., Disp: 90 tablet, Rfl: 0   METHOTREXATE PO, Take 1 tablet by mouth once a week., Disp: , Rfl:    Methylcellulose, Laxative, (CITRUCEL PO), Take by mouth., Disp: , Rfl:    metoprolol succinate (TOPROL-XL) 50 MG 24 hr tablet, TAKE 1 TABLET ONCE DAILY IN THE EVENING., Disp: 30 tablet, Rfl: 0   Multiple Vitamins-Minerals (PRESERVISION  AREDS 2 PO), Take 1 tablet by mouth 2 (two) times daily., Disp: , Rfl:    nitroGLYCERIN (NITROSTAT) 0.4 MG SL tablet, Place 1 tablet under the tongue as needed., Disp: , Rfl:    omeprazole (PRILOSEC) 40 MG capsule, Take 1 capsule by mouth daily., Disp: , Rfl:    predniSONE (DELTASONE) 1 MG tablet, Take 3 mg by mouth daily., Disp: , Rfl: :     Allergies  Allergen Reactions   Pollen Extract    Levofloxacin Anxiety    Agitation , nocturnal    FH: Brother deceased with pink he is cancer.  Brother deceased with brain tumor.  Sister with kidney cancer.  Sister with breast cancer.  A daughter cutaneous lupus, hyperthyroidism and Sjogren's.  Another daughter has celiac disease.  Another daughter has psoriasis.  SOCIAL HISTORY: She lives at [facility].  She has 4 children.  She does not smoke.  EtOH use reported as very occasional.  Review of Systems: Feels energy is good overall.  No fevers or sweats.  She does not crave ice.  She eats a regular diet.  Appetite has been mildly decreased recently.  She notes she is snacking less.  She estimates 4 to 5 pounds of weight loss recently.  No unusual headaches.  She has macular degeneration.  No dysphagia.  She has reflux.  She has mild dyspnea on exertion.  Bowels move regularly as long as she takes Citrucel.  No hematuria.  No numbness or tingling in the hands or feet.  Physical Exam:  Blood pressure 136/63, pulse 81, temperature 98.1 F (36.7 C), temperature source Oral, resp. rate 18, height 5' 2.6" (1.59 m), weight 139 lb 6.4 oz (63.2 kg), SpO2 100 %.  HEENT: No thrush or ulcers. Lungs: Lungs clear bilaterally. Cardiac: Regular rate and rhythm. Abdomen: No hepatosplenomegaly.  No mass. Vascular: No leg edema. Lymph nodes: No palpable cervical, supraclavicular, axillary or inguinal lymph nodes.  LABS:   Recent Labs    11/09/22 1349  WBC 9.7  HGB 11.2*  HCT 35.3*  PLT 300  Peripheral blood smear-few ovalocytes, few macrocytes, rare  teardrop, variation in red cell size; white blood cell morphology unremarkable; platelets normal in number.  No results for input(s): "NA", "K", "CL", "CO2", "GLUCOSE", "BUN", "CREATININE", "CALCIUM" in the last 72 hours.    RADIOLOGY:  CUP PACEART REMOTE DEVICE CHECK  Result Date: 10/29/2022 ILR summary report received. Battery status OK. Normal device function. No new symptom, tachy, brady, or pause episodes. One previous viewed and reviewed false AF episode. 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 Selman has a mild normocytic anemia.  We reviewed potential etiologies including iron deficiency, anemia of chronic disease/inflammation, MDS, effect of methotrexate, hemolysis.  We will complete additional laboratory evaluation and contact her with those results once available.  She will return for CBC and follow-up visit in approximately 3 months.  We are available to see her sooner if needed.  Patient seen with Dr. Benay Spice.  Ned Card, NP 11/09/2022, 3:13 PM  This was a shared visit with Ned Card.  Ms. Lint was interviewed and examined.  I reviewed the peripheral blood smear.  She is referred for evaluation of anemia.  She has a history of mild anemia over the past few years.  We discussed the differential diagnosis with Ms. Fullman and her daughter.  The anemia is most likely secondary to methotrexate, with the differential diagnosis including the anemia of chronic inflammation, bleeding, and early myelodysplasia.  I do not recommend a bone marrow biopsy at present.  We obtained additional diagnostic evaluation today including serum iron studies, reticulocyte count, and a myeloma panel.  I was present for greater than 50% today's visit.  I performed medical  decision making.   Julieanne Manson, MD Julieanne Manson, MD

## 2022-11-10 LAB — IRON AND TIBC
Iron: 52 ug/dL (ref 28–170)
Saturation Ratios: 14 % (ref 10.4–31.8)
TIBC: 374 ug/dL (ref 250–450)
UIBC: 322 ug/dL

## 2022-11-13 LAB — MULTIPLE MYELOMA PANEL, SERUM
Albumin SerPl Elph-Mcnc: 3.8 g/dL (ref 2.9–4.4)
Albumin/Glob SerPl: 1.5 (ref 0.7–1.7)
Alpha 1: 0.2 g/dL (ref 0.0–0.4)
Alpha2 Glob SerPl Elph-Mcnc: 0.6 g/dL (ref 0.4–1.0)
B-Globulin SerPl Elph-Mcnc: 0.9 g/dL (ref 0.7–1.3)
Gamma Glob SerPl Elph-Mcnc: 0.8 g/dL (ref 0.4–1.8)
Globulin, Total: 2.6 g/dL (ref 2.2–3.9)
IgA: 158 mg/dL (ref 64–422)
IgG (Immunoglobin G), Serum: 889 mg/dL (ref 586–1602)
IgM (Immunoglobulin M), Srm: 60 mg/dL (ref 26–217)
Total Protein ELP: 6.4 g/dL (ref 6.0–8.5)

## 2022-11-16 ENCOUNTER — Other Ambulatory Visit: Payer: Self-pay | Admitting: Cardiology

## 2022-11-16 DIAGNOSIS — I10 Essential (primary) hypertension: Secondary | ICD-10-CM

## 2022-11-18 DIAGNOSIS — M810 Age-related osteoporosis without current pathological fracture: Secondary | ICD-10-CM | POA: Diagnosis not present

## 2022-11-18 DIAGNOSIS — Z8739 Personal history of other diseases of the musculoskeletal system and connective tissue: Secondary | ICD-10-CM | POA: Diagnosis not present

## 2022-11-18 DIAGNOSIS — Z6825 Body mass index (BMI) 25.0-25.9, adult: Secondary | ICD-10-CM | POA: Diagnosis not present

## 2022-11-18 DIAGNOSIS — E559 Vitamin D deficiency, unspecified: Secondary | ICD-10-CM | POA: Diagnosis not present

## 2022-11-18 DIAGNOSIS — D649 Anemia, unspecified: Secondary | ICD-10-CM | POA: Diagnosis not present

## 2022-11-18 DIAGNOSIS — M1991 Primary osteoarthritis, unspecified site: Secondary | ICD-10-CM | POA: Diagnosis not present

## 2022-11-18 DIAGNOSIS — M0600 Rheumatoid arthritis without rheumatoid factor, unspecified site: Secondary | ICD-10-CM | POA: Diagnosis not present

## 2022-11-18 DIAGNOSIS — E663 Overweight: Secondary | ICD-10-CM | POA: Diagnosis not present

## 2022-11-18 DIAGNOSIS — Z7952 Long term (current) use of systemic steroids: Secondary | ICD-10-CM | POA: Diagnosis not present

## 2022-11-18 DIAGNOSIS — Z840 Family history of diseases of the skin and subcutaneous tissue: Secondary | ICD-10-CM | POA: Diagnosis not present

## 2022-11-18 NOTE — Progress Notes (Signed)
[facility] Summary Report / Loop Recorder

## 2022-11-20 ENCOUNTER — Encounter (HOSPITAL_COMMUNITY): Payer: Self-pay

## 2022-11-30 ENCOUNTER — Ambulatory Visit: Payer: Medicare Other

## 2022-11-30 DIAGNOSIS — I639 Cerebral infarction, unspecified: Secondary | ICD-10-CM

## 2022-12-01 LAB — CUP PACEART REMOTE DEVICE CHECK
Date Time Interrogation Session: 20240205005000
Implantable Pulse Generator Implant Date: 20230929
Pulse Gen Serial Number: 192101

## 2022-12-28 DIAGNOSIS — H353132 Nonexudative age-related macular degeneration, bilateral, intermediate dry stage: Secondary | ICD-10-CM | POA: Diagnosis not present

## 2022-12-28 DIAGNOSIS — H35372 Puckering of macula, left eye: Secondary | ICD-10-CM | POA: Diagnosis not present

## 2022-12-28 DIAGNOSIS — H35033 Hypertensive retinopathy, bilateral: Secondary | ICD-10-CM | POA: Diagnosis not present

## 2022-12-28 DIAGNOSIS — H43813 Vitreous degeneration, bilateral: Secondary | ICD-10-CM | POA: Diagnosis not present

## 2022-12-31 ENCOUNTER — Ambulatory Visit: Payer: Medicare Other

## 2022-12-31 DIAGNOSIS — I639 Cerebral infarction, unspecified: Secondary | ICD-10-CM

## 2022-12-31 LAB — CUP PACEART REMOTE DEVICE CHECK
Date Time Interrogation Session: 20240307055500
Implantable Pulse Generator Implant Date: 20230929
Pulse Gen Serial Number: 192101

## 2023-01-14 NOTE — Progress Notes (Signed)
[place] Loop Recorder 

## 2023-02-01 ENCOUNTER — Ambulatory Visit (INDEPENDENT_AMBULATORY_CARE_PROVIDER_SITE_OTHER): Payer: Medicare Other

## 2023-02-01 DIAGNOSIS — I639 Cerebral infarction, unspecified: Secondary | ICD-10-CM | POA: Diagnosis not present

## 2023-02-01 LAB — CUP PACEART REMOTE DEVICE CHECK
Date Time Interrogation Session: 20240408110100
Implantable Pulse Generator Implant Date: 20230929
Pulse Gen Serial Number: 192101

## 2023-02-02 NOTE — Progress Notes (Signed)
[facility] Summary Report / Loop Recorder 

## 2023-02-04 ENCOUNTER — Other Ambulatory Visit: Payer: Self-pay | Admitting: Cardiology

## 2023-02-04 DIAGNOSIS — E7800 Pure hypercholesterolemia, unspecified: Secondary | ICD-10-CM

## 2023-02-04 DIAGNOSIS — I209 Angina pectoris, unspecified: Secondary | ICD-10-CM

## 2023-02-09 ENCOUNTER — Inpatient Hospital Stay (HOSPITAL_BASED_OUTPATIENT_CLINIC_OR_DEPARTMENT_OTHER): Payer: Medicare Other | Admitting: Nurse Practitioner

## 2023-02-09 ENCOUNTER — Inpatient Hospital Stay: Payer: Medicare Other | Attending: Oncology

## 2023-02-09 ENCOUNTER — Encounter: Payer: Self-pay | Admitting: Nurse Practitioner

## 2023-02-09 VITALS — BP 119/83 | HR 83 | Temp 98.1°F | Resp 18 | Ht 62.6 in | Wt 141.0 lb

## 2023-02-09 DIAGNOSIS — Z7902 Long term (current) use of antithrombotics/antiplatelets: Secondary | ICD-10-CM | POA: Insufficient documentation

## 2023-02-09 DIAGNOSIS — E039 Hypothyroidism, unspecified: Secondary | ICD-10-CM | POA: Insufficient documentation

## 2023-02-09 DIAGNOSIS — I10 Essential (primary) hypertension: Secondary | ICD-10-CM | POA: Diagnosis not present

## 2023-02-09 DIAGNOSIS — M069 Rheumatoid arthritis, unspecified: Secondary | ICD-10-CM | POA: Insufficient documentation

## 2023-02-09 DIAGNOSIS — D649 Anemia, unspecified: Secondary | ICD-10-CM | POA: Insufficient documentation

## 2023-02-09 DIAGNOSIS — Z79631 Long term (current) use of antimetabolite agent: Secondary | ICD-10-CM | POA: Diagnosis not present

## 2023-02-09 LAB — CMP (CANCER CENTER ONLY)
ALT: 19 U/L (ref 0–44)
AST: 24 U/L (ref 15–41)
Albumin: 4.1 g/dL (ref 3.5–5.0)
Alkaline Phosphatase: 46 U/L (ref 38–126)
Anion gap: 13 (ref 5–15)
BUN: 21 mg/dL (ref 8–23)
CO2: 19 mmol/L — ABNORMAL LOW (ref 22–32)
Calcium: 8.9 mg/dL (ref 8.9–10.3)
Chloride: 103 mmol/L (ref 98–111)
Creatinine: 1.05 mg/dL — ABNORMAL HIGH (ref 0.44–1.00)
GFR, Estimated: 50 mL/min — ABNORMAL LOW (ref >60)
Glucose, Bld: 128 mg/dL — ABNORMAL HIGH (ref 70–99)
Potassium: 4.2 mmol/L (ref 3.5–5.1)
Sodium: 135 mmol/L (ref 135–145)
Total Bilirubin: 0.5 mg/dL (ref 0.3–1.2)
Total Protein: 6.1 g/dL — ABNORMAL LOW (ref 6.5–8.1)

## 2023-02-09 LAB — CBC WITH DIFFERENTIAL (CANCER CENTER ONLY)
Abs Immature Granulocytes: 0.03 10*3/uL (ref 0.00–0.07)
Basophils Absolute: 0.1 10*3/uL (ref 0.0–0.1)
Basophils Relative: 1 %
Eosinophils Absolute: 0.2 10*3/uL (ref 0.0–0.5)
Eosinophils Relative: 3 %
HCT: 32.9 % — ABNORMAL LOW (ref 36.0–46.0)
Hemoglobin: 10.7 g/dL — ABNORMAL LOW (ref 12.0–15.0)
Immature Granulocytes: 0 %
Lymphocytes Relative: 23 %
Lymphs Abs: 2 10*3/uL (ref 0.7–4.0)
MCH: 30.8 pg (ref 26.0–34.0)
MCHC: 32.5 g/dL (ref 30.0–36.0)
MCV: 94.8 fL (ref 80.0–100.0)
Monocytes Absolute: 0.6 10*3/uL (ref 0.1–1.0)
Monocytes Relative: 7 %
Neutro Abs: 5.5 10*3/uL (ref 1.7–7.7)
Neutrophils Relative %: 66 %
Platelet Count: 262 10*3/uL (ref 150–400)
RBC: 3.47 MIL/uL — ABNORMAL LOW (ref 3.87–5.11)
RDW: 14.6 % (ref 11.5–15.5)
WBC Count: 8.4 10*3/uL (ref 4.0–10.5)
nRBC: 0 % (ref 0.0–0.2)

## 2023-02-09 LAB — IRON AND TIBC
Iron: 80 ug/dL (ref 28–170)
Saturation Ratios: 21 % (ref 10.4–31.8)
TIBC: 382 ug/dL (ref 250–450)
UIBC: 302 ug/dL

## 2023-02-09 LAB — DIRECT ANTIGLOBULIN TEST (NOT AT ARMC)
DAT, IgG: NEGATIVE
DAT, complement: NEGATIVE

## 2023-02-09 LAB — FERRITIN: Ferritin: 22 ng/mL (ref 11–307)

## 2023-02-09 NOTE — Progress Notes (Signed)
  [facility] OFFICE PROGRESS NOTE   Diagnosis: Anemia  INTERVAL HISTORY:   [name] returns as scheduled.  She was seen in an initial visit 11/09/2022.  Mild anemia was felt to be secondary to methotrexate with differential including anemia of chronic inflammation, bleeding, or early myelodysplasia.  Ferritin returned in low normal range at 19, myeloma panel negative.  Overall feels well.  Energy level is at baseline.  She is not aware of any bleeding.  She continues methotrexate.  Objective:  Vital signs in last 24 hours:  Blood pressure 119/83, pulse 83, temperature 98.1 F (36.7 C), temperature source Oral, resp. rate 18, height 5' 2.6" (1.59 m), weight 141 lb (64 kg), SpO2 98 %.    HEENT: No thrush or ulcers. Resp: Lungs clear bilaterally. Cardio: Regular rate and rhythm. GI: Abdomen soft and nontender.  No hepatosplenomegaly. Vascular: No leg edema. Skin: Scattered ecchymoses on forearms.   Lab Results:  Lab Results  Component Value Date   WBC 8.4 02/09/2023   HGB 10.7 (L) 02/09/2023   HCT 32.9 (L) 02/09/2023   MCV 94.8 02/09/2023   PLT 262 02/09/2023   NEUTROABS 5.5 02/09/2023    Imaging:  No results found.  Medications: I have reviewed the patient's current medications.  Assessment/Plan: Normocytic anemia Rheumatoid arthritis on methotrexate Multiple bilateral cerebral embolic infarcts September 2023, on Plavix Hypothyroid Hypertension    Disposition: [name] has a persistent normocytic anemia.  Hemoglobin is slightly more decreased.  She appears asymptomatic.  I reviewed the differential diagnoses with Ms. Arnott and her daughter to include iron deficiency, anemia of chronic disease/inflammation, MDS, related to methotrexate, hemolysis.  Ferritin 3 months ago was in low normal range.  We will follow-up on the iron studies from today as well as DAT test.   She will return for lab and follow-up in 3 months.  We are available to see her  sooner if needed.    Lonna Cobb ANP/GNP-BC   02/09/2023  2:43 PM

## 2023-02-23 ENCOUNTER — Ambulatory Visit (INDEPENDENT_AMBULATORY_CARE_PROVIDER_SITE_OTHER): Payer: Medicare Other | Admitting: Neurology

## 2023-02-23 ENCOUNTER — Encounter: Payer: Self-pay | Admitting: Neurology

## 2023-02-23 VITALS — BP 134/68 | HR 77 | Ht 62.0 in | Wt 142.6 lb

## 2023-02-23 DIAGNOSIS — Z8673 Personal history of transient ischemic attack (TIA), and cerebral infarction without residual deficits: Secondary | ICD-10-CM | POA: Diagnosis not present

## 2023-02-23 DIAGNOSIS — G3184 Mild cognitive impairment, so stated: Secondary | ICD-10-CM

## 2023-02-23 NOTE — Patient Instructions (Addendum)
I had a long d/w patient and her daughter about her remote bicerebral embolioc strokes, risk for recurrent stroke/TIAs, personally independently reviewed imaging studies and stroke evaluation results and answered questions.Continue Plavix 75 mg daily  for secondary stroke prevention and maintain strict control of hypertension with blood pressure goal below 130/90, diabetes with hemoglobin A1c goal below 6.5% and lipids with LDL cholesterol goal below 70 mg/dL. I also advised the patient to eat a healthy diet with plenty of whole grains, cereals, fruits and vegetables, exercise regularly and maintain ideal body weight.Check screening carotid ultrasound. Advised to participate in cognitively challenging activities like solving crossword puzzles, playing bridge and sudoku.  We also discussed memory compensation strategies.  Followup in the future with me in 1 year or call earlier if needed  Memory Compensation Strategies  Use "WARM" strategy.  W= write it down  A= associate it  R= repeat it  M= make a mental note  2.   You can keep a Glass blower/designer.  Use a 3-ring notebook with sections for the following: calendar, important names and phone numbers,  medications, doctors' names/phone numbers, lists/reminders, and a section to journal what you did  each day.   3.    Use a calendar to write appointments down.  4.    Write yourself a schedule for the day.  This can be placed on the calendar or in a separate section of the Memory Notebook.  Keeping a  regular schedule can help memory.  5.    Use medication organizer with sections for each day or morning/evening pills.  You may need help loading it  6.    Keep a basket, or pegboard by the door.  Place items that you need to take out with you in the basket or on the pegboard.  You may also want to  include a message board for reminders.  7.    Use sticky notes.  Place sticky notes with reminders in a place where the task is performed.  For example: "  turn off the  stove" placed by the stove, "lock the door" placed on the door at eye level, " take your medications" on  the bathroom mirror or by the place where you normally take your medications.  8.    Use alarms/timers.  Use while cooking to remind yourself to check on food or as a reminder to take your medicine, or as a  reminder to make a call, or as a reminder to perform another task, etc.

## 2023-02-23 NOTE — Progress Notes (Signed)
[facility] [address] [place]. [place] [address] [phone]       OFFICE FOLLOW-UP NOTE  [name] Date of Birth:  1927-08-22 Medical Record Number:  [id number]   HPI: Initial visit 08/25/2022 [name] is a pleasant 89 year old Caucasian lady seen today for initial office follow-up visit following hospital consultation for stroke in September 2023.  She is accompanied by her daughter today.  History is obtained from them and review of electronic medical records and I have personally reviewed pertinent available imaging films in PACS.Layani Foronda Vargas is a 89 y.o. female with a past medical history of PMR, seronegative RA, steroid-induced osteoporosis, hypertension, hypothyroidism, TIA, staggered gait, hyperlipidemia who presented to the ED on 07/23/2022 with blurry vision and balance issues.She states on Monday she noticed that her gait was off and then she discovered she couldn't read. She was traveling in a car for 5 hours and when she got out she was very unsteady.  Her daughters also stated that she had some confusion getting undressed for bed at night and then redressed in the morning. She does have gait dysfunction at baseline, however it was worse on Monday.  [name] also stated that she has had a hard time reading since Monday, she says she has trouble staying on the same line in the book she was reading. On Wednesday morning they came back home and went to the ED for evaluation.  MRI scan of the brain showed multiple small acute infarcts in the bilateral parietal, occipital and frontal lobes as well as left temporal lobes, bilateral thalami and basal ganglia and bilateral cerebellar hemispheres.  CT angiogram study of the brain and neck did not show large vessel stenosis or occlusion.  Echocardiogram showed ejection fraction 60-65%.  No cardiac source of embolism but left atrium was moderately dilated.  LDL cholesterol was 63 mg percent He was 5.9.  Patient was  started on dual antiplatelet therapy aspirin and Plavix for 3 weeks followed by Plavix alone.  He has a loop recorder inserted and so far paroxysmal A-fib has not yet been found.  Patient states she is done well having recently is much better and she can repeat in fine planes now.  Balance is also improved.  She has to be careful and hold onto things.  Uses cane and to.  She is scheduled for outpatient physical therapy.  She is tolerating Plavix well without bruising or bleeding.. She had no recurrent TIA or stroke symptoms.  She is a left-sided friend's home in independent living section.  Loop recorder report on 08/23/2022 shows no evidence of A-fib so far. Update 02/23/2023 : She returns for follow-up after last visit 5 months ago.  She is accompanied by her daughter [name].  Patient states that she is doing well without recurrent stroke or TIA symptoms.  Remains on Plavix which is tolerating well without bruising or bleeding.  Her blood pressure is under good control.  She has a loop recorder and last interrogation on 02/01/2023 shows no evidence of paroxysmal A-fib yet.  She is tolerating Lipitor well without muscle aches and pains.  She continues to have mild short-term memory difficulties unchanged.  She has some occasional word finding difficulties and trouble finding words when speaking.  He has been doing balance exercises once a week and aerobics 3 days a week.  He feels the balance is still off but she has had no falls or injuries.  He complains of a poor vision from  macular degeneration and has noted some right shoulder and arm pain for the last week.  She has not yet discussed this with her primary physician.  ROS:   14 system review of systems is positive for shoulder pain, imbalance hearing loss, vision difficulties, gait difficulties, imbalance and all other systems negative  PMH:  Past Medical History:  Diagnosis Date   Arthritis    Carpal tunnel syndrome    Gait abnormality    has for  78yr-no use of cane   Hx of TIA (transient ischemic attack) and stroke    Hyperlipidemia    Hypertension    Hypothyroidism    Osteoporosis    Wears glasses    Wears hearing aid     Social History:  Social History   Socioeconomic History   Marital status: Widowed    Spouse name: Not on file   Number of children: 4   Years of education: Not on file   Highest education level: Not on file  Occupational History   Not on file  Tobacco Use   Smoking status: Never   Smokeless tobacco: Never  Vaping Use   Vaping Use: Never used  Substance and Sexual Activity   Alcohol use: Yes    Alcohol/week: 1.0 standard drink of alcohol    Types: 1 Glasses of wine per week    Comment: rarely   Drug use: No   Sexual activity: Not on file  Other Topics Concern   Not on file  Social History Narrative   Lives independently and 4 daughters live nearby and are supportive    Social Determinants of Health   Financial Resource Strain: Low Risk  (11/09/2022)   Overall Financial Resource Strain (CARDIA)    Difficulty of Paying Living Expenses: Not hard at all  Food Insecurity: No Food Insecurity (11/09/2022)   Hunger Vital Sign    Worried About Running Out of Food in the Last Year: Never true    Ran Out of Food in the Last Year: Never true  Transportation Needs: No Transportation Needs (11/09/2022)   PRAPARE - Administrator, Civil Service (Medical): No    Lack of Transportation (Non-Medical): No  Physical Activity: Sufficiently Active (11/09/2022)   Exercise Vital Sign    Days of Exercise per Week: 7 days    Minutes of Exercise per Session: 30 min  Stress: No Stress Concern Present (11/09/2022)   [employer] of Occupational Health - Occupational Stress Questionnaire    Feeling of Stress : Not at all  Social Connections: Moderately Integrated (11/09/2022)   Social Connection and Isolation Panel [NHANES]    Frequency of Communication with Friends and Family: More than three times  a week    Frequency of Social Gatherings with Friends and Family: More than three times a week    Attends Religious Services: More than 4 times per year    Active Member of [employer] or Organizations: Not on file    Attends Banker Meetings: More than 4 times per year    Marital Status: Widowed  Intimate Partner Violence: Not At Risk (11/09/2022)   Humiliation, Afraid, Rape, and Kick questionnaire    Fear of Current or Ex-Partner: No    Emotionally Abused: No    Physically Abused: No    Sexually Abused: No    Medications:   Current Outpatient Medications on File Prior to Visit  Medication Sig Dispense Refill   amLODipine (NORVASC) 5 MG tablet TAKE ONE TABLET BY MOUTH  DAILY IN AFTERNOON 90 tablet 2   atorvastatin (LIPITOR) 10 MG tablet Take 1 tablet (10 mg total) by mouth daily. 90 tablet 3   cholecalciferol (VITAMIN D) 1000 units tablet Take 800 Units by mouth daily.     clopidogrel (PLAVIX) 75 MG tablet Take 75 mg by mouth daily.     cyanocobalamin 1000 MCG tablet Take 1,000 mcg by mouth as needed. Takes a couple times weekly     denosumab (PROLIA) 60 MG/ML SOSY injection Inject 60 mg into the skin every 6 (six) months.     diphenhydrAMINE (BENADRYL) 25 MG tablet Take 12.5 mg by mouth at bedtime as needed for sleep or allergies.     famotidine (PEPCID) 20 MG tablet Take 40 mg by mouth daily.     folic acid (FOLVITE) 1 MG tablet Take 1 mg by mouth daily.     hydrALAZINE (APRESOLINE) 25 MG tablet Take 1 tablet (25 mg total) by mouth 3 (three) times daily. 90 tablet 3   isosorbide mononitrate (IMDUR) 60 MG 24 hr tablet Take 1 tablet (60 mg total) by mouth daily. 90 tablet 3   levothyroxine (SYNTHROID) 112 MCG tablet Take 112 mcg by mouth every morning.     losartan (COZAAR) 100 MG tablet TAKE 1 TABLET ONCE DAILY. 90 tablet 0   METHOTREXATE PO Take 1 tablet by mouth once a week.     Methylcellulose, Laxative, (CITRUCEL PO) Take by mouth.     metoprolol succinate (TOPROL-XL) 50 MG  24 hr tablet TAKE 1 TABLET ONCE DAILY IN THE EVENING. 30 tablet 0   Multiple Vitamins-Minerals (PRESERVISION AREDS 2 PO) Take 1 tablet by mouth 2 (two) times daily.     omeprazole (PRILOSEC) 40 MG capsule Take 1 capsule by mouth daily.     predniSONE (DELTASONE) 1 MG tablet Take 3 mg by mouth daily.     acetaminophen (TYLENOL) 500 MG tablet Take 500 mg by mouth every 6 (six) hours as needed. (Patient not taking: Reported on 02/23/2023)     nitroGLYCERIN (NITROSTAT) 0.4 MG SL tablet Place 1 tablet under the tongue as needed. (Patient not taking: Reported on 02/23/2023)     No current facility-administered medications on file prior to visit.    Allergies:   Allergies  Allergen Reactions   Pollen Extract    Levofloxacin Anxiety    Agitation , nocturnal    Physical Exam General: well developed, well nourished pleasant elderly Caucasian lady, seated, in no evident distress Head: head normocephalic and atraumatic.  Neck: supple with no carotid or supraclavicular bruits Cardiovascular: regular rate and rhythm, no murmurs Musculoskeletal: no deformity Skin:  no rash/petichiae Vascular:  Normal pulses all extremities Vitals:   02/23/23 1434  BP: 134/68  Pulse: 77   Neurologic Exam Mental Status: Awake and fully alert. Oriented to place and time. Recent and remote memory intact. Attention span, concentration and fund of knowledge appropriate. Mood and affect appropriate.  Recall 3/3.  Clock drawing 4/4.  Able to name 9 animals which can walk on 4 legs Cranial Nerves: Fundoscopic exam not done. Pupils equal, briskly reactive to light. Extraocular movements full without nystagmus. Visual fields full to confrontation. Hearing diminished bilaterally. Facial sensation intact. Face, tongue, palate moves normally and symmetrically.  Motor: Normal bulk and tone. Normal strength in all tested extremity muscles. Sensory.: intact to touch ,pinprick .position and vibratory sensation.  Coordination: Rapid  alternating movements normal in all extremities. Finger-to-nose and heel-to-shin performed accurately bilaterally. Gait and Station: Arises from chair  without difficulty. Stance is slightly stooped.  Gait is cautious and slightly broad-based. Reflexes: 1+ and symmetric. Toes downgoing.   NIHSS  0 Modified Rankin  1   ASSESSMENT: 89 year old Caucasian lady with multiple bilateral cerebral embolic infarcts of cryptogenic etiology in September 2023.  Vascular risk factors of hypertension, hyperlipidemia and age.  She is doing well from mild balance difficulties and age-appropriate mild cognitive impairment     PLAN:I had a long d/w patient and her daughter about her remote bicerebral embolioc strokes, risk for recurrent stroke/TIAs, personally independently reviewed imaging studies and stroke evaluation results and answered questions.Continue Plavix 75 mg daily  for secondary stroke prevention and maintain strict control of hypertension with blood pressure goal below 130/90, diabetes with hemoglobin A1c goal below 6.5% and lipids with LDL cholesterol goal below 70 mg/dL. I also advised the patient to eat a healthy diet with plenty of whole grains, cereals, fruits and vegetables, exercise regularly and maintain ideal body weight.Check screening carotid ultrasound. Advised to participate in cognitively challenging activities like solving crossword puzzles, playing bridge and sudoku.  We also discussed memory compensation strategies.  Followup in the future with me in 1 year or call earlier if needed..Greater than 50% of time during this 35 minute visit was spent on counseling,explanation of diagnosis, planning of further management, discussion with patient and family and coordination of care Delia Heady, MD Note: This document was prepared with digital dictation and possible smart phrase technology. Any transcriptional errors that result from this process are unintentional

## 2023-03-03 DIAGNOSIS — Z840 Family history of diseases of the skin and subcutaneous tissue: Secondary | ICD-10-CM | POA: Diagnosis not present

## 2023-03-03 DIAGNOSIS — E663 Overweight: Secondary | ICD-10-CM | POA: Diagnosis not present

## 2023-03-03 DIAGNOSIS — M1991 Primary osteoarthritis, unspecified site: Secondary | ICD-10-CM | POA: Diagnosis not present

## 2023-03-03 DIAGNOSIS — D649 Anemia, unspecified: Secondary | ICD-10-CM | POA: Diagnosis not present

## 2023-03-03 DIAGNOSIS — M810 Age-related osteoporosis without current pathological fracture: Secondary | ICD-10-CM | POA: Diagnosis not present

## 2023-03-03 DIAGNOSIS — M0600 Rheumatoid arthritis without rheumatoid factor, unspecified site: Secondary | ICD-10-CM | POA: Diagnosis not present

## 2023-03-03 DIAGNOSIS — Z6825 Body mass index (BMI) 25.0-25.9, adult: Secondary | ICD-10-CM | POA: Diagnosis not present

## 2023-03-03 DIAGNOSIS — Z8739 Personal history of other diseases of the musculoskeletal system and connective tissue: Secondary | ICD-10-CM | POA: Diagnosis not present

## 2023-03-03 DIAGNOSIS — E559 Vitamin D deficiency, unspecified: Secondary | ICD-10-CM | POA: Diagnosis not present

## 2023-03-03 DIAGNOSIS — Z7952 Long term (current) use of systemic steroids: Secondary | ICD-10-CM | POA: Diagnosis not present

## 2023-03-04 ENCOUNTER — Ambulatory Visit (INDEPENDENT_AMBULATORY_CARE_PROVIDER_SITE_OTHER): Payer: Medicare Other

## 2023-03-04 DIAGNOSIS — I639 Cerebral infarction, unspecified: Secondary | ICD-10-CM | POA: Diagnosis not present

## 2023-03-04 LAB — CUP PACEART REMOTE DEVICE CHECK
Date Time Interrogation Session: 20240509010700
Implantable Pulse Generator Implant Date: 20230929
Pulse Gen Serial Number: 192101

## 2023-03-09 ENCOUNTER — Other Ambulatory Visit: Payer: Self-pay | Admitting: Family Medicine

## 2023-03-09 ENCOUNTER — Ambulatory Visit
Admission: RE | Admit: 2023-03-09 | Discharge: 2023-03-09 | Disposition: A | Discharge status: Home / Self Care | Payer: Medicare Other | Source: Ambulatory Visit | Attending: Family Medicine | Admitting: Family Medicine

## 2023-03-09 DIAGNOSIS — M25551 Pain in right hip: Secondary | ICD-10-CM

## 2023-03-09 DIAGNOSIS — R109 Unspecified abdominal pain: Secondary | ICD-10-CM | POA: Diagnosis not present

## 2023-03-09 DIAGNOSIS — Z6825 Body mass index (BMI) 25.0-25.9, adult: Secondary | ICD-10-CM | POA: Diagnosis not present

## 2023-03-10 NOTE — Progress Notes (Signed)
[place] Loop Recorder 

## 2023-03-18 DIAGNOSIS — Z0000 Encounter for general adult medical examination without abnormal findings: Secondary | ICD-10-CM | POA: Diagnosis not present

## 2023-03-18 DIAGNOSIS — Z1389 Encounter for screening for other disorder: Secondary | ICD-10-CM | POA: Diagnosis not present

## 2023-03-18 DIAGNOSIS — E663 Overweight: Secondary | ICD-10-CM | POA: Diagnosis not present

## 2023-03-19 ENCOUNTER — Ambulatory Visit (HOSPITAL_COMMUNITY)
Admission: RE | Admit: 2023-03-19 | Discharge: 2023-03-19 | Disposition: A | Discharge status: Home / Self Care | Payer: Medicare Other | Source: Ambulatory Visit | Attending: Neurology | Admitting: Neurology

## 2023-03-19 DIAGNOSIS — Z8673 Personal history of transient ischemic attack (TIA), and cerebral infarction without residual deficits: Secondary | ICD-10-CM | POA: Diagnosis not present

## 2023-03-19 NOTE — Progress Notes (Signed)
Carotid artery duplex has been completed. Preliminary results can be found in CV Proc through chart review.   03/19/23 1:34 PM Olen Cordial RVT

## 2023-03-23 NOTE — Progress Notes (Signed)
[facility] Summary Report / Loop Recorder 

## 2023-03-24 NOTE — Progress Notes (Signed)
Kindly inform the patient that carotid ultrasound study shows no significant narrowing of either carotid arteries in the neck

## 2023-04-05 ENCOUNTER — Ambulatory Visit (INDEPENDENT_AMBULATORY_CARE_PROVIDER_SITE_OTHER): Payer: Medicare Other

## 2023-04-05 DIAGNOSIS — I639 Cerebral infarction, unspecified: Secondary | ICD-10-CM

## 2023-04-06 LAB — CUP PACEART REMOTE DEVICE CHECK
Date Time Interrogation Session: 20240610011400
Implantable Pulse Generator Implant Date: 20230929
Pulse Gen Serial Number: 192101

## 2023-04-08 ENCOUNTER — Telehealth: Payer: Self-pay | Admitting: Cardiology

## 2023-04-08 ENCOUNTER — Telehealth: Payer: Self-pay | Admitting: Neurology

## 2023-04-08 NOTE — Telephone Encounter (Signed)
Spoke to patients daughter. Advised symptom activator is on but her device is implanted for CVA which is more for surveillance. Advised if she had any AF noted it would have triggered on the device. Device connected most recently 04/08/23 and no alerts triggered. Thankful for call back.

## 2023-04-08 NOTE — Telephone Encounter (Signed)
Daughter reports that pt has had 2 episodes of blurred vision within the last week.  Daughter said pt refers to them as TIA's.  Daughter said they don't last long enough to get pt to the car to go to ED. Daughter says she does not think it is major enough to go to ED, she would like a call to discuss this concern, please call her.

## 2023-04-08 NOTE — Telephone Encounter (Signed)
Called the daughter back to get more information. She states that pt was telling her about having an episode of blurred vision last week and as the daughter and her were looking at her phone to review results from the carotid ultrasound she stated to daughter, " I have to sit down, my eyes are getting blurred". She informed her daughter that these were the same symptoms as what happened the first time.  Her daughter checked BP and it was 140/72 and from what she could tell heart rate seemed regular. (She has reached out to the cardiologist as well to inform them of the time it happened so that they can look at loop recorder.) She is up to date with visits to her retina and eye MD. The episode lasted 5 min or so and then went away. She denied any other symptoms. She states that pt remains compliant with medication as well.  Advised that we recommend to continue to do what she is doing, taking her medication, checking BP when event occurs and journal when this happens. Advised if symptoms linger, she should call 911 to be further evaluated. Informed I would send to Dr Pearlean Brownie and if he has further recommendations, I will let them know.

## 2023-04-08 NOTE — Telephone Encounter (Signed)
  1. Has your device fired? no  2. Is you device beeping? no  3. Are you experiencing draining or swelling at device site? no  4. Are you calling to see if we received your device transmission? no  5. Have you passed out? No  Patient's daughter called stating her mother has a loop recorder, she wants to know if her mother experiences any symptoms if she should document them, she also wants to know if anything would so up in the loop recorder. Please advise.     Please route to Device Clinic Pool

## 2023-04-08 NOTE — Telephone Encounter (Signed)
Per Dr Pearlean Brownie Patient is negative unremarkable CT angiogram in the past, and recent carotid ultrasound and we will loop recorder interrogation this week was unremarkable.  I am afraid there is nothing else I can recommend at this point

## 2023-04-19 DIAGNOSIS — N1831 Chronic kidney disease, stage 3a: Secondary | ICD-10-CM | POA: Diagnosis not present

## 2023-04-19 DIAGNOSIS — Z6825 Body mass index (BMI) 25.0-25.9, adult: Secondary | ICD-10-CM | POA: Diagnosis not present

## 2023-04-19 DIAGNOSIS — M810 Age-related osteoporosis without current pathological fracture: Secondary | ICD-10-CM | POA: Diagnosis not present

## 2023-04-19 DIAGNOSIS — M069 Rheumatoid arthritis, unspecified: Secondary | ICD-10-CM | POA: Diagnosis not present

## 2023-04-19 DIAGNOSIS — E039 Hypothyroidism, unspecified: Secondary | ICD-10-CM | POA: Diagnosis not present

## 2023-04-19 DIAGNOSIS — M25511 Pain in right shoulder: Secondary | ICD-10-CM | POA: Diagnosis not present

## 2023-04-19 DIAGNOSIS — I10 Essential (primary) hypertension: Secondary | ICD-10-CM | POA: Diagnosis not present

## 2023-04-26 DIAGNOSIS — M810 Age-related osteoporosis without current pathological fracture: Secondary | ICD-10-CM | POA: Diagnosis not present

## 2023-04-27 NOTE — Progress Notes (Signed)
[place] Loop Recorder 

## 2023-05-04 LAB — CUP PACEART REMOTE DEVICE CHECK
Date Time Interrogation Session: 20240709061800
Implantable Pulse Generator Implant Date: 20230929
Pulse Gen Serial Number: 192101

## 2023-05-06 ENCOUNTER — Ambulatory Visit (INDEPENDENT_AMBULATORY_CARE_PROVIDER_SITE_OTHER): Payer: Medicare Other

## 2023-05-06 DIAGNOSIS — I639 Cerebral infarction, unspecified: Secondary | ICD-10-CM | POA: Diagnosis not present

## 2023-05-10 ENCOUNTER — Inpatient Hospital Stay: Payer: Medicare Other | Attending: Oncology

## 2023-05-10 ENCOUNTER — Inpatient Hospital Stay (HOSPITAL_BASED_OUTPATIENT_CLINIC_OR_DEPARTMENT_OTHER): Payer: Medicare Other | Admitting: Oncology

## 2023-05-10 VITALS — BP 134/68 | HR 73 | Temp 98.1°F | Resp 18 | Wt 141.8 lb

## 2023-05-10 DIAGNOSIS — R29898 Other symptoms and signs involving the musculoskeletal system: Secondary | ICD-10-CM | POA: Diagnosis not present

## 2023-05-10 DIAGNOSIS — R2681 Unsteadiness on feet: Secondary | ICD-10-CM | POA: Diagnosis not present

## 2023-05-10 DIAGNOSIS — D649 Anemia, unspecified: Secondary | ICD-10-CM | POA: Diagnosis not present

## 2023-05-10 DIAGNOSIS — E039 Hypothyroidism, unspecified: Secondary | ICD-10-CM | POA: Diagnosis not present

## 2023-05-10 DIAGNOSIS — M069 Rheumatoid arthritis, unspecified: Secondary | ICD-10-CM | POA: Diagnosis not present

## 2023-05-10 DIAGNOSIS — R0609 Other forms of dyspnea: Secondary | ICD-10-CM | POA: Insufficient documentation

## 2023-05-10 DIAGNOSIS — M5459 Other low back pain: Secondary | ICD-10-CM | POA: Diagnosis not present

## 2023-05-10 DIAGNOSIS — I10 Essential (primary) hypertension: Secondary | ICD-10-CM | POA: Insufficient documentation

## 2023-05-10 LAB — CMP (CANCER CENTER ONLY)
ALT: 20 U/L (ref 0–44)
AST: 19 U/L (ref 15–41)
Albumin: 4.1 g/dL (ref 3.5–5.0)
Alkaline Phosphatase: 50 U/L (ref 38–126)
Anion gap: 8 (ref 5–15)
BUN: 23 mg/dL (ref 8–23)
CO2: 24 mmol/L (ref 22–32)
Calcium: 8.9 mg/dL (ref 8.9–10.3)
Chloride: 104 mmol/L (ref 98–111)
Creatinine: 1 mg/dL (ref 0.44–1.00)
GFR, Estimated: 52 mL/min — ABNORMAL LOW (ref >60)
Glucose, Bld: 119 mg/dL — ABNORMAL HIGH (ref 70–99)
Potassium: 4.3 mmol/L (ref 3.5–5.1)
Sodium: 136 mmol/L (ref 135–145)
Total Bilirubin: 0.4 mg/dL (ref 0.3–1.2)
Total Protein: 6.6 g/dL (ref 6.5–8.1)

## 2023-05-10 LAB — CBC WITH DIFFERENTIAL (CANCER CENTER ONLY)
Abs Immature Granulocytes: 0.04 K/uL (ref 0.00–0.07)
Basophils Absolute: 0.1 K/uL (ref 0.0–0.1)
Basophils Relative: 1 %
Eosinophils Absolute: 0.3 K/uL (ref 0.0–0.5)
Eosinophils Relative: 3 %
HCT: 32.1 % — ABNORMAL LOW (ref 36.0–46.0)
Hemoglobin: 10.5 g/dL — ABNORMAL LOW (ref 12.0–15.0)
Immature Granulocytes: 0 %
Lymphocytes Relative: 20 %
Lymphs Abs: 1.8 K/uL (ref 0.7–4.0)
MCH: 31.2 pg (ref 26.0–34.0)
MCHC: 32.7 g/dL (ref 30.0–36.0)
MCV: 95.3 fL (ref 80.0–100.0)
Monocytes Absolute: 0.9 K/uL (ref 0.1–1.0)
Monocytes Relative: 10 %
Neutro Abs: 5.9 K/uL (ref 1.7–7.7)
Neutrophils Relative %: 66 %
Platelet Count: 285 K/uL (ref 150–400)
RBC: 3.37 MIL/uL — ABNORMAL LOW (ref 3.87–5.11)
RDW: 14.7 % (ref 11.5–15.5)
WBC Count: 8.9 K/uL (ref 4.0–10.5)
nRBC: 0 % (ref 0.0–0.2)

## 2023-05-10 LAB — IRON AND TIBC
Iron: 48 ug/dL (ref 28–170)
Saturation Ratios: 13 % (ref 10.4–31.8)
TIBC: 377 ug/dL (ref 250–450)
UIBC: 329 ug/dL

## 2023-05-10 LAB — FERRITIN: Ferritin: 19 ng/mL (ref 11–307)

## 2023-05-10 NOTE — Progress Notes (Signed)
  [facility] OFFICE PROGRESS NOTE   Diagnosis: Anemia  INTERVAL HISTORY:   Ms. Soderberg returns as scheduled.  She continues methotrexate.  No bleeding.  She exercises 4 days/week.  She has mild exertional dyspnea.  Objective:  Vital signs in last 24 hours:  Blood pressure 134/68, pulse 73, temperature 98.1 F (36.7 C), temperature source Temporal, resp. rate 18, weight 141 lb 12.8 oz (64.3 kg), SpO2 98%.    HEENT: No thrush or ulcers Lymphatics: No cervical, supraclavicular, or axillary nodes Resp: Lungs clear bilaterally Cardio: Regular rate and rhythm GI: No hepatosplenomegaly Vascular: No leg edema   Lab Results:  Lab Results  Component Value Date   WBC 8.9 05/10/2023   HGB 10.5 (L) 05/10/2023   HCT 32.1 (L) 05/10/2023   MCV 95.3 05/10/2023   PLT 285 05/10/2023   NEUTROABS 5.9 05/10/2023    CMP  Lab Results  Component Value Date   NA 135 02/09/2023   K 4.2 02/09/2023   CL 103 02/09/2023   CO2 19 (L) 02/09/2023   GLUCOSE 128 (H) 02/09/2023   BUN 21 02/09/2023   CREATININE 1.05 (H) 02/09/2023   CALCIUM 8.9 02/09/2023   PROT 6.1 (L) 02/09/2023   ALBUMIN 4.1 02/09/2023   AST 24 02/09/2023   ALT 19 02/09/2023   ALKPHOS 46 02/09/2023   BILITOT 0.5 02/09/2023   GFRNONAA 50 (L) 02/09/2023   GFRAA >60 04/14/2016     Medications: I have reviewed the patient's current medications.   Assessment/Plan: Normocytic anemia Rheumatoid arthritis on methotrexate Multiple bilateral cerebral embolic infarcts September 2023, on Plavix Hypothyroid Hypertension     Disposition: Ms. Lisle has mild normocytic anemia.  Ferritin remains in the low normal range.  The DAT was negative when she was here in April.  I suspect the anemia is related to methotrexate and rheumatoid arthritis.  The differential diagnosis includes myelodysplasia.  She appears asymptomatic from the anemia.  We decided to continue observation.  She will call for symptoms of anemia.   Ms. Culverhouse will return for an office visit and CBC in 4 months.  Thornton Papas, MD  05/10/2023  3:28 PM

## 2023-05-14 ENCOUNTER — Other Ambulatory Visit (HOSPITAL_BASED_OUTPATIENT_CLINIC_OR_DEPARTMENT_OTHER): Payer: Self-pay

## 2023-05-14 ENCOUNTER — Telehealth: Payer: Medicare Other | Admitting: Nurse Practitioner

## 2023-05-14 DIAGNOSIS — R2681 Unsteadiness on feet: Secondary | ICD-10-CM | POA: Diagnosis not present

## 2023-05-14 DIAGNOSIS — M5459 Other low back pain: Secondary | ICD-10-CM | POA: Diagnosis not present

## 2023-05-14 DIAGNOSIS — U071 COVID-19: Secondary | ICD-10-CM

## 2023-05-14 DIAGNOSIS — R29898 Other symptoms and signs involving the musculoskeletal system: Secondary | ICD-10-CM | POA: Diagnosis not present

## 2023-05-14 MED ORDER — BENZONATATE 100 MG PO CAPS
100.0000 mg | ORAL_CAPSULE | Freq: Three times a day (TID) | ORAL | 0 refills | Status: DC | PRN
Start: 2023-05-14 — End: 2023-05-27
  Filled 2023-05-14: qty 30, 10d supply, fill #0

## 2023-05-14 MED ORDER — MOLNUPIRAVIR EUA 200MG CAPSULE
4.0000 | ORAL_CAPSULE | Freq: Two times a day (BID) | ORAL | 0 refills | Status: AC
Start: 2023-05-14 — End: 2023-05-19
  Filled 2023-05-14: qty 40, 5d supply, fill #0

## 2023-05-14 NOTE — Progress Notes (Signed)
Virtual Visit Consent   [name], you are scheduled for a virtual visit with a [facility] provider today. Just as with appointments in the office, your consent must be obtained to participate. Your consent will be active for this visit and any virtual visit you may have with one of our providers in the next 365 days. If you have a MyChart account, a copy of this consent can be sent to you electronically.  As this is a virtual visit, video technology does not allow for your provider to perform a traditional examination. This may limit your provider's ability to fully assess your condition. If your provider identifies any concerns that need to be evaluated in person or the need to arrange testing (such as labs, EKG, etc.), we will make arrangements to do so. Although advances in technology are sophisticated, we cannot ensure that it will always work on either your end or our end. If the connection with a video visit is poor, the visit may have to be switched to a telephone visit. With either a video or telephone visit, we are not always able to ensure that we have a secure connection.  By engaging in this virtual visit, you consent to the provision of healthcare and authorize for your insurance to be billed (if applicable) for the services provided during this visit. Depending on your insurance coverage, you may receive a charge related to this service.  I need to obtain your verbal consent now. Are you willing to proceed with your visit today? [name] has provided verbal consent on 05/14/2023 for a virtual visit (video or telephone). Viviano Simas, FNP  Date: 05/14/2023 2:37 PM  Virtual Visit via Video Note   I, Viviano Simas, connected with  [name]  ([id number], 1926-12-11) on 05/14/23 at  3:00 PM EDT by a video-enabled telemedicine application and verified that I am speaking with the correct person using two identifiers.  Location: Patient: Virtual Visit Location Patient:  Home Provider: Virtual Visit Location Provider: Home Office   I discussed the limitations of evaluation and management by telemedicine and the availability of in person appointments. The patient expressed understanding and agreed to proceed.    History of Present Illness: [name] is a 89 y.o. who identifies as a female who was assigned female at birth, and is being seen today after testing for COVID  She took a home COVID test today and was positive  She had a sore throat this morning  She has a low grade fever  She also has a slight dry cough   Symptoms started last night   She has not had COVID in the past  She has been vaccinated including a recent booster   Daughter present during the call   Problems:  Patient Active Problem List   Diagnosis Date Noted   GERD (gastroesophageal reflux disease) 07/23/2022   Stroke-like symptoms 07/22/2022   Hyponatremia 12/08/2020   Osteoporosis, post-menopausal 08/04/2018   Acquired hypothyroidism 11/30/2017   Seronegative rheumatoid arthritis (HCC) 11/30/2017   Constipation 04/14/2017   Left anterior knee pain 11/03/2016   TIA (transient ischemic attack) 04/15/2016   Family history of breast cancer in first degree relative 03/10/2016   Osteoarthritis, shoulder 11/30/2015   Essential hypertension 11/30/2015   Vitamin D deficiency 11/30/2015   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   Age-related cognitive decline 09/09/2014   History of polymyalgia rheumatica 09/09/2014   Long-term use of high-risk medication 09/09/2014    Allergies:  Allergies  Allergen Reactions   Pollen Extract    Levofloxacin Other (See Comments)    Insomnia   Medications:  Current Outpatient Medications:    acetaminophen (TYLENOL) 500 MG tablet, Take 500 mg by mouth every 6 (six) hours as needed., Disp: , Rfl:    amLODipine (NORVASC) 5 MG tablet, TAKE ONE TABLET BY MOUTH DAILY IN AFTERNOON, Disp: 90 tablet, Rfl: 2   atorvastatin (LIPITOR) 10 MG  tablet, Take 1 tablet (10 mg total) by mouth daily., Disp: 90 tablet, Rfl: 3   cholecalciferol (VITAMIN D) 1000 units tablet, Take 800 Units by mouth daily., Disp: , Rfl:    clopidogrel (PLAVIX) 75 MG tablet, Take 75 mg by mouth daily., Disp: , Rfl:    cyanocobalamin 1000 MCG tablet, Take 1,000 mcg by mouth daily., Disp: , Rfl:    denosumab (PROLIA) 60 MG/ML SOSY injection, Inject 60 mg into the skin every 6 (six) months., Disp: , Rfl:    diphenhydrAMINE (BENADRYL) 25 MG tablet, Take 12.5 mg by mouth at bedtime as needed for sleep or allergies., Disp: , Rfl:    famotidine (PEPCID) 20 MG tablet, Take 40 mg by mouth daily., Disp: , Rfl:    folic acid (FOLVITE) 1 MG tablet, Take 1 mg by mouth daily., Disp: , Rfl:    hydrALAZINE (APRESOLINE) 25 MG tablet, Take 1 tablet (25 mg total) by mouth 3 (three) times daily., Disp: 90 tablet, Rfl: 3   isosorbide mononitrate (IMDUR) 60 MG 24 hr tablet, Take 1 tablet (60 mg total) by mouth daily., Disp: 90 tablet, Rfl: 3   levothyroxine (SYNTHROID) 112 MCG tablet, Take 112 mcg by mouth every morning., Disp: , Rfl:    losartan (COZAAR) 100 MG tablet, TAKE 1 TABLET ONCE DAILY., Disp: 90 tablet, Rfl: 0   METHOTREXATE PO, Take 7 tablets by mouth once a week., Disp: , Rfl:    Methylcellulose, Laxative, (CITRUCEL PO), Take by mouth., Disp: , Rfl:    metoprolol succinate (TOPROL-XL) 50 MG 24 hr tablet, TAKE 1 TABLET ONCE DAILY IN THE EVENING., Disp: 30 tablet, Rfl: 0   Multiple Vitamins-Minerals (PRESERVISION AREDS PO), Take 1 tablet by mouth 2 (two) times daily., Disp: , Rfl:    omeprazole (PRILOSEC) 40 MG capsule, Take 1 capsule by mouth daily., Disp: , Rfl:    predniSONE (DELTASONE) 1 MG tablet, Take 3 mg by mouth daily., Disp: , Rfl:   Observations/Objective: Patient is well-developed, well-nourished in no acute distress.  Resting comfortably  at home.  Head is normocephalic, atraumatic.  No labored breathing.  Speech is clear and coherent with logical content.   Patient is alert and oriented at baseline.    Assessment and Plan:  1. COVID-19  - molnupiravir EUA (LAGEVRIO) 200 mg CAPS capsule; Take 4 capsules (800 mg total) by mouth 2 (two) times daily for 5 days.  Dispense: 40 capsule; Refill: 0 - benzonatate (TESSALON) 100 MG capsule; Take 1 capsule (100 mg total) by mouth 3 (three) times daily as needed.  Dispense: 30 capsule; Refill: 0     Follow Up Instructions: I discussed the assessment and treatment plan with the patient. The patient was provided an opportunity to ask questions and all were answered. The patient agreed with the plan and demonstrated an understanding of the instructions.  A copy of instructions were sent to the patient via MyChart unless otherwise noted below.    The patient was advised to call back or seek an in-person evaluation if the symptoms worsen or if the condition  fails to improve as anticipated.  Time:  I spent 15 minutes with the patient via telehealth technology discussing the above problems/concerns.    Viviano Simas, FNP

## 2023-05-24 NOTE — Progress Notes (Signed)
[facility] Summary Report / Loop Recorder 

## 2023-05-27 ENCOUNTER — Encounter: Payer: Self-pay | Admitting: Nurse Practitioner

## 2023-05-27 ENCOUNTER — Non-Acute Institutional Stay: Payer: Medicare Other | Admitting: Nurse Practitioner

## 2023-05-27 VITALS — BP 118/72 | HR 78 | Temp 98.2°F | Ht 62.0 in | Wt 140.0 lb

## 2023-05-27 DIAGNOSIS — Z66 Do not resuscitate: Secondary | ICD-10-CM | POA: Diagnosis not present

## 2023-05-27 DIAGNOSIS — G459 Transient cerebral ischemic attack, unspecified: Secondary | ICD-10-CM

## 2023-05-27 DIAGNOSIS — I10 Essential (primary) hypertension: Secondary | ICD-10-CM | POA: Diagnosis not present

## 2023-05-27 DIAGNOSIS — Z8739 Personal history of other diseases of the musculoskeletal system and connective tissue: Secondary | ICD-10-CM | POA: Diagnosis not present

## 2023-05-27 DIAGNOSIS — R2681 Unsteadiness on feet: Secondary | ICD-10-CM | POA: Diagnosis not present

## 2023-05-27 DIAGNOSIS — M5459 Other low back pain: Secondary | ICD-10-CM | POA: Diagnosis not present

## 2023-05-27 DIAGNOSIS — M810 Age-related osteoporosis without current pathological fracture: Secondary | ICD-10-CM | POA: Diagnosis not present

## 2023-05-27 DIAGNOSIS — K219 Gastro-esophageal reflux disease without esophagitis: Secondary | ICD-10-CM | POA: Diagnosis not present

## 2023-05-27 DIAGNOSIS — I447 Left bundle-branch block, unspecified: Secondary | ICD-10-CM | POA: Diagnosis not present

## 2023-05-27 DIAGNOSIS — N1831 Chronic kidney disease, stage 3a: Secondary | ICD-10-CM

## 2023-05-27 DIAGNOSIS — D638 Anemia in other chronic diseases classified elsewhere: Secondary | ICD-10-CM | POA: Diagnosis not present

## 2023-05-27 DIAGNOSIS — E039 Hypothyroidism, unspecified: Secondary | ICD-10-CM

## 2023-05-27 DIAGNOSIS — H3530 Unspecified macular degeneration: Secondary | ICD-10-CM

## 2023-05-27 DIAGNOSIS — E785 Hyperlipidemia, unspecified: Secondary | ICD-10-CM | POA: Diagnosis not present

## 2023-05-27 DIAGNOSIS — L84 Corns and callosities: Secondary | ICD-10-CM | POA: Insufficient documentation

## 2023-05-27 DIAGNOSIS — M159 Polyosteoarthritis, unspecified: Secondary | ICD-10-CM

## 2023-05-27 DIAGNOSIS — N183 Chronic kidney disease, stage 3 unspecified: Secondary | ICD-10-CM | POA: Insufficient documentation

## 2023-05-27 NOTE — Assessment & Plan Note (Signed)
on Prolia

## 2023-05-27 NOTE — Assessment & Plan Note (Signed)
saw Cardiology, had loop recorder

## 2023-05-27 NOTE — Assessment & Plan Note (Deleted)
L MTJ plantar, f/u 1 week

## 2023-05-27 NOTE — Assessment & Plan Note (Signed)
taking folic acid, Iron 48, Hgb 10.5 05/10/23

## 2023-05-27 NOTE — Assessment & Plan Note (Signed)
R arm pain, saw Dr. Jacinto Halim 07/22/21, started 2022 Isosorbide for chest and left arm pain. It was deemed to be musculoskeletal pain in nature.

## 2023-05-27 NOTE — Assessment & Plan Note (Signed)
on Methotrexate, Prednisone.

## 2023-05-27 NOTE — Assessment & Plan Note (Signed)
on Levothyroxine, TSH 2.813 07/23/22

## 2023-05-27 NOTE — Assessment & Plan Note (Signed)
Stable,  taking Famotidine, Omeprazole

## 2023-05-27 NOTE — Assessment & Plan Note (Signed)
on Amlodipine, Hydralazine, Metoprolol, Losartan

## 2023-05-27 NOTE — Assessment & Plan Note (Signed)
followed by cardiology, neurology, on Plavix,

## 2023-05-27 NOTE — Assessment & Plan Note (Signed)
taking Atorvastatin.  

## 2023-05-27 NOTE — Assessment & Plan Note (Signed)
followed by Ophthalmology

## 2023-05-27 NOTE — Progress Notes (Signed)
Location:   Clinic [facility]   Place of Service:  Clinic (12) Provider: Chipper Oman NP  Code Status: DNR Goals of Care:     05/27/2023   10:06 AM  Advanced Directives  Does Patient Have a Medical Advance Directive? Yes  Type of Advance Directive Out of facility DNR (pink MOST or yellow form);Healthcare Power of [place];Living will  Does patient want to make changes to medical advance directive? No - Patient declined  Copy of Healthcare Power of Attorney in Chart? No - copy requested  Pre-existing out of facility DNR order (yellow form or pink MOST form) Yellow form placed in chart (order not valid for inpatient use);Pink MOST form placed in chart (order not valid for inpatient use)     Chief Complaint  Patient presents with   Establish Care    New patient to establish care. [facility] verified. Pill bottles not present at initial appointment. Discuss Vit d dose. Here with daughter Claris Che. Discuss specialists and how patient can centeralize care.     HPI: Patient is a 89 y.o. female seen today for new patient establishment   R arm pain, saw Dr. Jacinto Halim 07/22/21, started 2022 Isosorbide for chest and left arm pain. It was deemed to be musculoskeletal  pain in nature.  Anemia, taking folic acid, Iron 48, Hgb 10.5 05/10/23  CVA/TIA, craniotomy 2007 for subdural hematoma, followed by cardiology, neurology, on Plavix,   Left bundle branch block, saw Cardiology, had loop recorder  PMR, on Methotrexate, Prednisone.   OP on Prolia MD, followed by Ophthalmology  CKD Bun/creat 23/1.0 05/10/23  GERD, taking Famotidine, Omeprazole  HLD, taking Atorvastatin  HTN, on Amlodipine, Hydralazine, Metoprolol, Losartan  Hypothyroidism, on Levothyroxine, TSH 2.813 07/23/22 Past Medical History:  Diagnosis Date   Arthritis    Carpal tunnel syndrome    Gait abnormality    has for 21yr-no use of cane   Hx of TIA (transient ischemic attack) and stroke    Hyperlipidemia    Hypertension    Hypothyroidism     Osteoporosis    Wears glasses    Wears hearing aid     Past Surgical History:  Procedure Laterality Date   APPENDECTOMY     CARPAL TUNNEL RELEASE Left 07/11/2013   Procedure: CARPAL TUNNEL RELEASE;  Surgeon: Wyn Forster., MD;  Location: [facility];  Service: Orthopedics;  Laterality: Left;   COLONOSCOPY     LOOP RECORDER INSERTION N/A 07/24/2022   Procedure: LOOP RECORDER INSERTION;  Surgeon: Lanier Prude, MD;  Location: [facility];  Service: Cardiovascular;  Laterality: N/A;   SUBDURAL HEMATOMA EVACUATION VIA CRANIOTOMY  2007   TONSILLECTOMY      Allergies  Allergen Reactions   Pollen Extract    Levofloxacin Other (See Comments)    Insomnia    Allergies as of 05/27/2023       Reactions   Pollen Extract    Levofloxacin Other (See Comments)   Insomnia        Medication List        Accurate as of May 27, 2023  4:52 PM. If you have any questions, ask your nurse or doctor.          STOP taking these medications    benzonatate 100 MG capsule Commonly known as: TESSALON Stopped by: Aariz Maish X Joplin Canty       TAKE these medications    acetaminophen 500 MG tablet Commonly known as: TYLENOL Take 500 mg by mouth every  6 (six) hours as needed.   amLODipine 5 MG tablet Commonly known as: NORVASC TAKE ONE TABLET BY MOUTH DAILY IN AFTERNOON   atorvastatin 10 MG tablet Commonly known as: LIPITOR Take 1 tablet (10 mg total) by mouth daily.   CITRUCEL PO Take by mouth as needed.   clopidogrel 75 MG tablet Commonly known as: PLAVIX Take 75 mg by mouth daily.   cyanocobalamin 1000 MCG tablet Take 1,000 mcg by mouth daily.   denosumab 60 MG/ML Sosy injection Commonly known as: PROLIA Inject 60 mg into the skin every 6 (six) months.   diphenhydrAMINE 25 MG tablet Commonly known as: BENADRYL Take 12.5 mg by mouth at bedtime as needed for sleep or allergies.   famotidine 40 MG tablet Commonly known as: PEPCID Take 40 mg by mouth  daily. What changed: Another medication with the same name was removed. Continue taking this medication, and follow the directions you see here. Changed by: Alvera Tourigny X Edker Punt   folic acid 1 MG tablet Commonly known as: FOLVITE Take 1 mg by mouth daily.   hydrALAZINE 25 MG tablet Commonly known as: APRESOLINE Take 1 tablet (25 mg total) by mouth 3 (three) times daily.   isosorbide mononitrate 60 MG 24 hr tablet Commonly known as: IMDUR Take 1 tablet (60 mg total) by mouth daily.   levothyroxine 112 MCG tablet Commonly known as: SYNTHROID Take 112 mcg by mouth every morning.   losartan 100 MG tablet Commonly known as: COZAAR TAKE 1 TABLET ONCE DAILY.   METHOTREXATE PO Take 7 tablets by mouth once a week.   metoprolol succinate 50 MG 24 hr tablet Commonly known as: TOPROL-XL TAKE 1 TABLET ONCE DAILY IN THE EVENING.   omeprazole 40 MG capsule Commonly known as: PRILOSEC Take 1 capsule by mouth daily.   predniSONE 1 MG tablet Commonly known as: DELTASONE Take 3 mg by mouth daily.   PRESERVISION AREDS PO Take 1 tablet by mouth 2 (two) times daily.   VITAMIN D-3 PO Take 800 Units by mouth as directed.        Review of Systems:  Review of Systems  Constitutional:  Negative for appetite change, fatigue and fever.  HENT:  Positive for hearing loss. Negative for congestion and trouble swallowing.   Eyes:  Positive for visual disturbance.       MD  Respiratory:  Negative for chest tightness, shortness of breath and wheezing.   Cardiovascular:  Negative for chest pain, palpitations and leg swelling.  Gastrointestinal:  Negative for abdominal pain, constipation, nausea and vomiting.  Genitourinary:  Negative for dysuria and urgency.  Musculoskeletal:  Positive for arthralgias, back pain and gait problem.       R+L arm pain/discomfort, positional.  Skin:  Negative for color change.  Neurological:  Negative for speech difficulty, weakness and headaches.       Left great toe  burning/pain some times.   Psychiatric/Behavioral:  Negative for behavioral problems and sleep disturbance. The patient is not nervous/anxious.     Health Maintenance  Topic Date Due   COVID-19 Vaccine (6 - 2023-24 season) 09/21/2022   INFLUENZA VACCINE  05/27/2023   Medicare Annual Wellness (AWV)  03/17/2024   DTaP/Tdap/Td (2 - Td or Tdap) 11/28/2025   Pneumonia Vaccine 72+ Years old  Completed   DEXA SCAN  Completed   Zoster Vaccines- Shingrix  Completed   HPV VACCINES  Aged Out    Physical Exam: Vitals:   05/27/23 1126  BP: 118/72  Pulse: 78  Temp: 98.2 F (36.8 C)  TempSrc: Temporal  SpO2: 98%  Weight: 140 lb (63.5 kg)  Height: 5\' 2"  (1.575 m)   Body mass index is 25.61 kg/m. Physical Exam Vitals and nursing note reviewed.  Constitutional:      Appearance: Normal appearance.  HENT:     Head: Normocephalic and atraumatic.     Nose: Nose normal.     Mouth/Throat:     Mouth: Mucous membranes are moist.  Eyes:     Extraocular Movements: Extraocular movements intact.     Conjunctiva/sclera: Conjunctivae normal.     Pupils: Pupils are equal, round, and reactive to light.  Cardiovascular:     Rate and Rhythm: Normal rate and regular rhythm.     Heart sounds: Murmur heard.     Comments: Left sternal border soft SM Pulmonary:     Effort: Pulmonary effort is normal.     Breath sounds: No rales.  Abdominal:     Palpations: Abdomen is soft.     Tenderness: There is no abdominal tenderness.  Musculoskeletal:     Cervical back: Normal range of motion and neck supple.     Right lower leg: No edema.     Left lower leg: No edema.  Skin:    General: Skin is warm and dry.  Neurological:     General: No focal deficit present.     Mental Status: She is alert and oriented to person, place, and time. Mental status is at baseline.     Gait: Gait abnormal.  Psychiatric:        Mood and Affect: Mood normal.        Behavior: Behavior normal.        Thought Content: Thought  content normal.        Judgment: Judgment normal.     Labs reviewed: Basic Metabolic Panel: Recent Labs    07/23/22 1741 11/09/22 1349 02/09/23 1354 05/10/23 1431  NA  --  139 135 136  K  --  4.3 4.2 4.3  CL  --  103 103 104  CO2  --  27 19* 24  GLUCOSE  --  99 128* 119*  BUN  --  19 21 23   CREATININE  --  1.09* 1.05* 1.00  CALCIUM  --  9.2 8.9 8.9  TSH 2.813  --   --   --    Liver Function Tests: Recent Labs    11/09/22 1349 02/09/23 1354 05/10/23 1431  AST 22 24 19   ALT 30 19 20   ALKPHOS 62 46 50  BILITOT 0.4 0.5 0.4  PROT 6.4* 6.1* 6.6  ALBUMIN 4.4 4.1 4.1   No results for input(s): "LIPASE", "AMYLASE" in the last 8760 hours. No results for input(s): "AMMONIA" in the last 8760 hours. CBC: Recent Labs    11/09/22 1349 02/09/23 1328 05/10/23 1431  WBC 9.7 8.4 8.9  NEUTROABS 6.3 5.5 5.9  HGB 11.2* 10.7* 10.5*  HCT 35.3* 32.9* 32.1*  MCV 96.2 94.8 95.3  PLT 300 262 285   Lipid Panel: Recent Labs    07/24/22 0434  CHOL 116  HDL 39*  LDLCALC 63  TRIG 70  CHOLHDL 3.0   Lab Results  Component Value Date   HGBA1C 5.9 (H) 07/24/2022    Procedures since last visit: CUP PACEART REMOTE DEVICE CHECK  Result Date: 05/04/2023 ILR summary report received. Battery status OK. Normal device function. No new symptom, tachy, brady, or pause episodes. No new AF episodes. 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   LBBB (left bundle branch block)  saw Cardiology, had loop recorder  TIA (transient ischemic attack)   followed by cardiology, neurology, on Plavix,   Anemia, chronic disease  taking folic acid, Iron 48, Hgb 10.5 05/10/23  Osteoarthritis, multiple sites R arm pain, saw Dr. Jacinto Halim 07/22/21, started 2022 Isosorbide for chest and left arm pain. It was deemed to be musculoskeletal pain in nature.   Labs/tests ordered:  none  Next appt:  1 year

## 2023-05-27 NOTE — Assessment & Plan Note (Signed)
Bun/creat 23/1.0 05/10/23

## 2023-06-01 DIAGNOSIS — R2681 Unsteadiness on feet: Secondary | ICD-10-CM | POA: Diagnosis not present

## 2023-06-01 DIAGNOSIS — M5459 Other low back pain: Secondary | ICD-10-CM | POA: Diagnosis not present

## 2023-06-03 DIAGNOSIS — R2681 Unsteadiness on feet: Secondary | ICD-10-CM | POA: Diagnosis not present

## 2023-06-03 DIAGNOSIS — M5459 Other low back pain: Secondary | ICD-10-CM | POA: Diagnosis not present

## 2023-06-07 ENCOUNTER — Ambulatory Visit: Payer: Medicare Other

## 2023-06-07 DIAGNOSIS — I639 Cerebral infarction, unspecified: Secondary | ICD-10-CM | POA: Diagnosis not present

## 2023-06-07 LAB — CUP PACEART REMOTE DEVICE CHECK
Date Time Interrogation Session: 20240812022400
Implantable Pulse Generator Implant Date: 20230929
Pulse Gen Serial Number: 192101

## 2023-06-08 DIAGNOSIS — M5459 Other low back pain: Secondary | ICD-10-CM | POA: Diagnosis not present

## 2023-06-08 DIAGNOSIS — R2681 Unsteadiness on feet: Secondary | ICD-10-CM | POA: Diagnosis not present

## 2023-06-09 NOTE — Telephone Encounter (Signed)
Done

## 2023-06-22 NOTE — Progress Notes (Signed)
[place] Loop Recorder 

## 2023-07-05 DIAGNOSIS — H35033 Hypertensive retinopathy, bilateral: Secondary | ICD-10-CM | POA: Diagnosis not present

## 2023-07-05 DIAGNOSIS — H43813 Vitreous degeneration, bilateral: Secondary | ICD-10-CM | POA: Diagnosis not present

## 2023-07-05 DIAGNOSIS — H35372 Puckering of macula, left eye: Secondary | ICD-10-CM | POA: Diagnosis not present

## 2023-07-05 DIAGNOSIS — H353132 Nonexudative age-related macular degeneration, bilateral, intermediate dry stage: Secondary | ICD-10-CM | POA: Diagnosis not present

## 2023-07-08 ENCOUNTER — Ambulatory Visit (INDEPENDENT_AMBULATORY_CARE_PROVIDER_SITE_OTHER): Payer: Medicare Other

## 2023-07-08 DIAGNOSIS — I639 Cerebral infarction, unspecified: Secondary | ICD-10-CM | POA: Diagnosis not present

## 2023-07-12 LAB — CUP PACEART REMOTE DEVICE CHECK
Date Time Interrogation Session: 20240916003100
Implantable Pulse Generator Implant Date: 20230929
Pulse Gen Serial Number: 192101

## 2023-07-13 ENCOUNTER — Encounter: Payer: Self-pay | Admitting: Oncology

## 2023-07-16 ENCOUNTER — Other Ambulatory Visit (HOSPITAL_BASED_OUTPATIENT_CLINIC_OR_DEPARTMENT_OTHER): Payer: Self-pay

## 2023-07-16 DIAGNOSIS — Z23 Encounter for immunization: Secondary | ICD-10-CM | POA: Diagnosis not present

## 2023-07-16 MED ORDER — COVID-19 MRNA VAC-TRIS(PFIZER) 30 MCG/0.3ML IM SUSY
0.3000 mL | PREFILLED_SYRINGE | Freq: Once | INTRAMUSCULAR | 0 refills | Status: AC
  Filled 2023-07-16: qty 0.3, 1d supply, fill #0

## 2023-07-20 NOTE — Progress Notes (Signed)
[facility] Summary Report / Loop Recorder 

## 2023-07-21 ENCOUNTER — Encounter: Payer: Self-pay | Admitting: Adult Health

## 2023-07-21 ENCOUNTER — Ambulatory Visit
Admission: RE | Admit: 2023-07-21 | Discharge: 2023-07-21 | Disposition: A | Discharge status: Home / Self Care | Payer: Medicare Other | Source: Ambulatory Visit | Attending: Adult Health | Admitting: Adult Health

## 2023-07-21 ENCOUNTER — Ambulatory Visit (INDEPENDENT_AMBULATORY_CARE_PROVIDER_SITE_OTHER): Payer: Medicare Other | Admitting: Adult Health

## 2023-07-21 VITALS — BP 125/78 | HR 72 | Temp 97.4°F | Resp 18 | Ht 62.0 in | Wt 142.4 lb

## 2023-07-21 DIAGNOSIS — M25512 Pain in left shoulder: Secondary | ICD-10-CM | POA: Diagnosis not present

## 2023-07-21 DIAGNOSIS — I10 Essential (primary) hypertension: Secondary | ICD-10-CM | POA: Diagnosis not present

## 2023-07-21 DIAGNOSIS — M542 Cervicalgia: Secondary | ICD-10-CM | POA: Diagnosis not present

## 2023-07-21 MED ORDER — DICLOFENAC SODIUM 1 % EX GEL
2.0000 g | Freq: Four times a day (QID) | CUTANEOUS | 3 refills | Status: DC | PRN
Start: 2023-07-21 — End: 2024-06-01

## 2023-07-21 NOTE — Progress Notes (Signed)
[facility] clinic   Provider:  Kenard Gower DNP  Code Status:  Full Code  Goals of Care:     07/21/2023    8:31 AM  Advanced Directives  Does Patient Have a Medical Advance Directive? Yes  Type of Advance Directive Out of facility DNR (pink MOST or yellow form);Healthcare Power of [place];Living will  Does patient want to make changes to medical advance directive? No - Patient declined  Copy of Healthcare Power of Attorney in Chart? No - copy requested  Pre-existing out of facility DNR order (yellow form or pink MOST form) Yellow form placed in chart (order not valid for inpatient use);Pink MOST form placed in chart (order not valid for inpatient use)     Chief Complaint  Patient presents with   Acute Visit    Left shoulder pain     HPI: Patient is a 89 y.o. female seen today for an acute visit for left shoulder pain. She was accompanied today by her youngest daughter. She is a resident of [facility]. She started having pain on her neck a week ago. She now complained of left shoulder pain which started several days ago. She takes Tylenol PRN for pain and heating pad to her left shoulder. When she stopped using using Tylenol and heating pad, neck and shoulder pain re-started. She rated her right shoulder pain 6/10 and neck pain 8-9/10. 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  BP 125/78, takes Amlodipine, Hydralazine and Losartan for hypertension  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   Hx of TIA (transient ischemic attack) and stroke    Hyperlipidemia    Hypertension    Hypothyroidism    Osteoporosis    Wears glasses    Wears hearing aid     Past Surgical History:  Procedure Laterality Date   APPENDECTOMY     CARPAL TUNNEL RELEASE Left 07/11/2013   Procedure: CARPAL TUNNEL RELEASE;  Surgeon: Wyn Forster.,  MD;  Location: [facility];  Service: Orthopedics;  Laterality: Left;   COLONOSCOPY     LOOP RECORDER INSERTION N/A 07/24/2022   Procedure: LOOP RECORDER INSERTION;  Surgeon: Lanier Prude, MD;  Location: [facility];  Service: Cardiovascular;  Laterality: N/A;   SUBDURAL HEMATOMA EVACUATION VIA CRANIOTOMY  2007   TONSILLECTOMY      Allergies  Allergen Reactions   Pollen Extract    Levofloxacin Other (See Comments)    Insomnia    Outpatient Encounter Medications as of 07/21/2023  Medication Sig   acetaminophen (TYLENOL) 500 MG tablet Take 500 mg by mouth every 6 (six) hours as needed.   amLODipine (NORVASC) 5 MG tablet TAKE ONE TABLET BY MOUTH DAILY IN AFTERNOON   atorvastatin (LIPITOR) 10 MG tablet Take 1 tablet (10 mg total) by mouth daily.   Cholecalciferol (VITAMIN D-3 PO) Take 800 Units by mouth as directed.   clopidogrel (PLAVIX) 75 MG tablet Take 75 mg by mouth daily.   cyanocobalamin 1000 MCG tablet Take 1,000 mcg by mouth daily.   denosumab (PROLIA) 60 MG/ML SOSY injection Inject 60 mg into the skin every 6 (six) months.   diclofenac Sodium (VOLTAREN) 1 % GEL Apply 2 g topically 4 (four) times daily as needed.   diphenhydrAMINE (BENADRYL) 25 MG tablet Take 12.5 mg by mouth at  bedtime as needed for sleep or allergies.   famotidine (PEPCID) 40 MG tablet Take 40 mg by mouth daily.   folic acid (FOLVITE) 1 MG tablet Take 1 mg by mouth daily.   hydrALAZINE (APRESOLINE) 25 MG tablet Take 1 tablet (25 mg total) by mouth 3 (three) times daily.   isosorbide mononitrate (IMDUR) 60 MG 24 hr tablet Take 1 tablet (60 mg total) by mouth daily.   levothyroxine (SYNTHROID) 112 MCG tablet Take 112 mcg by mouth every morning.   losartan (COZAAR) 100 MG tablet TAKE 1 TABLET ONCE DAILY.   methotrexate (RHEUMATREX) 2.5 MG tablet Take 7 tablets by mouth once a week.   Methylcellulose, Laxative, (CITRUCEL PO) Take by mouth as needed.   metoprolol succinate (TOPROL-XL) 50  MG 24 hr tablet TAKE 1 TABLET ONCE DAILY IN THE EVENING.   Multiple Vitamins-Minerals (PRESERVISION AREDS PO) Take 1 tablet by mouth 2 (two) times daily.   omeprazole (PRILOSEC) 40 MG capsule Take 1 capsule by mouth daily.   predniSONE (DELTASONE) 1 MG tablet Take 3 mg by mouth daily.   [DISCONTINUED] methotrexate (RHEUMATREX) 2.5 MG tablet Take 2.5 mg by mouth once a week. Caution:Chemotherapy. Protect from light.   [DISCONTINUED] METHOTREXATE PO Take 7 tablets by mouth once a week.   No facility-administered encounter medications on file as of 07/21/2023.    Review of Systems:  Review of Systems  Constitutional:  Negative for activity change, appetite change, chills, fatigue and fever.  HENT:  Negative for congestion, hearing loss, rhinorrhea and sore throat.   Eyes: Negative.   Respiratory:  Negative for cough, shortness of breath and wheezing.   Cardiovascular:  Negative for chest pain, palpitations and leg swelling.  Gastrointestinal:  Negative for abdominal distention, abdominal pain, constipation, diarrhea, nausea and vomiting.  Genitourinary:  Negative for difficulty urinating, flank pain, frequency and urgency.  Musculoskeletal:  Negative for arthralgias, back pain and myalgias.  Skin:  Negative for color change, rash and wound.  Neurological:  Negative for dizziness, weakness and headaches.  Psychiatric/Behavioral:  Negative for behavioral problems. The patient is not nervous/anxious.     Health Maintenance  Topic Date Due   INFLUENZA VACCINE  05/27/2023   COVID-19 Vaccine (7 - 2023-24 season) 09/10/2023   Medicare Annual Wellness (AWV)  03/17/2024   DTaP/Tdap/Td (2 - Td or Tdap) 11/28/2025   Pneumonia Vaccine 59+ Years old  Completed   DEXA SCAN  Completed   Zoster Vaccines- Shingrix  Completed   HPV VACCINES  Aged Out    Physical Exam: Vitals:   07/21/23 0812  BP: 125/78  Pulse: 72  Resp: 18  Temp: (!) 97.4 F (36.3 C)  SpO2: 98%  Weight: 142 lb 6.4 oz (64.6  kg)  Height: 5\' 2"  (1.575 m)   Body mass index is 26.05 kg/m. Physical Exam Constitutional:      Appearance: Normal appearance.  HENT:     Head: Normocephalic and atraumatic.     Nose: Nose normal.     Mouth/Throat:     Mouth: Mucous membranes are moist.  Eyes:     Conjunctiva/sclera: Conjunctivae normal.  Cardiovascular:     Rate and Rhythm: Normal rate and regular rhythm.  Pulmonary:     Effort: Pulmonary effort is normal.     Breath sounds: Normal breath sounds.  Abdominal:     General: Bowel sounds are normal.     Palpations: Abdomen is soft.  Musculoskeletal:        General: Normal range of motion.  Cervical back: Normal range of motion.  Skin:    General: Skin is warm and dry.  Neurological:     General: No focal deficit present.     Mental Status: She is alert and oriented to person, place, and time.  Psychiatric:        Mood and Affect: Mood normal.        Behavior: Behavior normal.        Thought Content: Thought content normal.        Judgment: Judgment normal.     Labs reviewed: Basic Metabolic Panel: Recent Labs    07/23/22 1741 11/09/22 1349 02/09/23 1354 05/10/23 1431  NA  --  139 135 136  K  --  4.3 4.2 4.3  CL  --  103 103 104  CO2  --  27 19* 24  GLUCOSE  --  99 128* 119*  BUN  --  19 21 23   CREATININE  --  1.09* 1.05* 1.00  CALCIUM  --  9.2 8.9 8.9  TSH 2.813  --   --   --    Liver Function Tests: Recent Labs    11/09/22 1349 02/09/23 1354 05/10/23 1431  AST 22 24 19   ALT 30 19 20   ALKPHOS 62 46 50  BILITOT 0.4 0.5 0.4  PROT 6.4* 6.1* 6.6  ALBUMIN 4.4 4.1 4.1   No results for input(s): "LIPASE", "AMYLASE" in the last 8760 hours. No results for input(s): "AMMONIA" in the last 8760 hours. CBC: Recent Labs    11/09/22 1349 02/09/23 1328 05/10/23 1431  WBC 9.7 8.4 8.9  NEUTROABS 6.3 5.5 5.9  HGB 11.2* 10.7* 10.5*  HCT 35.3* 32.9* 32.1*  MCV 96.2 94.8 95.3  PLT 300 262 285   Lipid Panel: Recent Labs     07/24/22 0434  CHOL 116  HDL 39*  LDLCALC 63  TRIG 70  CHOLHDL 3.0   Lab Results  Component Value Date   HGBA1C 5.9 (H) 07/24/2022    Procedures since last visit: CUP PACEART REMOTE DEVICE CHECK  Result Date: 07/12/2023 ILR summary report received. Battery status OK. Normal device function. No new symptom, tachy, brady, or pause episodes. No new AF episodes. Monthly summary reports and ROV/PRN LA, CVRS   Assessment/Plan 1. Neck pain -  continue Acetaminophen PRN - DG Cervical Spine Complete - diclofenac Sodium (VOLTAREN) 1 % GEL; Apply 2 g topically 4 (four) times daily as needed.  Dispense: 100 g; Refill: 3  2. Left shoulder amputee -  continue Acetaminophen PRN and heating pad as needed - DG Shoulder Left - diclofenac Sodium (VOLTAREN) 1 % GEL; Apply 2 g topically 4 (four) times daily as needed.  Dispense: 100 g; Refill: 3    Labs/tests ordered:  - DG Shoulder Left - DG Cervical Spine Complete  Next appt:  10/28/2023

## 2023-07-27 ENCOUNTER — Other Ambulatory Visit (HOSPITAL_BASED_OUTPATIENT_CLINIC_OR_DEPARTMENT_OTHER): Payer: Self-pay

## 2023-07-27 DIAGNOSIS — Z23 Encounter for immunization: Secondary | ICD-10-CM | POA: Diagnosis not present

## 2023-07-27 MED ORDER — INFLUENZA VAC A&B SURF ANT ADJ 0.5 ML IM SUSY
0.5000 mL | PREFILLED_SYRINGE | Freq: Once | INTRAMUSCULAR | 0 refills | Status: AC
  Filled 2023-07-27: qty 0.5, 1d supply, fill #0

## 2023-07-30 ENCOUNTER — Other Ambulatory Visit (HOSPITAL_BASED_OUTPATIENT_CLINIC_OR_DEPARTMENT_OTHER): Payer: Self-pay

## 2023-08-02 ENCOUNTER — Other Ambulatory Visit: Payer: Self-pay

## 2023-08-02 DIAGNOSIS — I209 Angina pectoris, unspecified: Secondary | ICD-10-CM

## 2023-08-02 DIAGNOSIS — I10 Essential (primary) hypertension: Secondary | ICD-10-CM

## 2023-08-02 DIAGNOSIS — E7800 Pure hypercholesterolemia, unspecified: Secondary | ICD-10-CM

## 2023-08-02 MED ORDER — LEVOTHYROXINE SODIUM 112 MCG PO TABS: 112.0000 ug | ORAL_TABLET | Freq: Every morning | ORAL | 3 refills | Status: DC

## 2023-08-02 MED ORDER — ISOSORBIDE MONONITRATE ER 60 MG PO TB24: 60.0000 mg | ORAL_TABLET | Freq: Every day | ORAL | 3 refills | Status: DC

## 2023-08-02 MED ORDER — AMLODIPINE BESYLATE 5 MG PO TABS
5.0000 mg | ORAL_TABLET | Freq: Every day | ORAL | 3 refills | Status: DC
Start: 2023-08-02 — End: 2024-07-12

## 2023-08-02 MED ORDER — METOPROLOL SUCCINATE ER 50 MG PO TB24: 50.0000 mg | ORAL_TABLET | Freq: Every day | ORAL | 3 refills | Status: DC

## 2023-08-02 MED ORDER — LOSARTAN POTASSIUM 100 MG PO TABS: 100.0000 mg | ORAL_TABLET | Freq: Every day | ORAL | 0 refills | Status: DC

## 2023-08-02 MED ORDER — CLOPIDOGREL BISULFATE 75 MG PO TABS: 75.0000 mg | ORAL_TABLET | Freq: Every day | ORAL | 3 refills | Status: DC

## 2023-08-02 MED ORDER — HYDRALAZINE HCL 25 MG PO TABS
25.0000 mg | ORAL_TABLET | Freq: Three times a day (TID) | ORAL | 3 refills | Status: DC
Start: 2023-08-02 — End: 2024-02-15

## 2023-08-02 MED ORDER — ATORVASTATIN CALCIUM 10 MG PO TABS
10.0000 mg | ORAL_TABLET | Freq: Every day | ORAL | 3 refills | Status: DC
Start: 2023-08-02 — End: 2024-07-12

## 2023-08-09 ENCOUNTER — Ambulatory Visit (INDEPENDENT_AMBULATORY_CARE_PROVIDER_SITE_OTHER): Payer: Medicare Other

## 2023-08-09 DIAGNOSIS — I639 Cerebral infarction, unspecified: Secondary | ICD-10-CM | POA: Diagnosis not present

## 2023-08-09 NOTE — Progress Notes (Signed)
-    cervical x-ray showed degenerative changes, no acute bone abnormalities

## 2023-08-09 NOTE — Progress Notes (Signed)
Left shoulder x-ray has degenerative changes, no acute bone issues.

## 2023-08-10 LAB — CUP PACEART REMOTE DEVICE CHECK
Date Time Interrogation Session: 20241014143600
Implantable Pulse Generator Implant Date: 20230929
Pulse Gen Serial Number: 192101

## 2023-08-16 NOTE — Telephone Encounter (Signed)
Message routed to Dr.Veludandi due to PCP Mast, Man X, NP being out of office.

## 2023-08-25 NOTE — Progress Notes (Signed)
[place] Loop Recorder 

## 2023-09-01 DIAGNOSIS — Z6825 Body mass index (BMI) 25.0-25.9, adult: Secondary | ICD-10-CM | POA: Diagnosis not present

## 2023-09-01 DIAGNOSIS — M810 Age-related osteoporosis without current pathological fracture: Secondary | ICD-10-CM | POA: Diagnosis not present

## 2023-09-01 DIAGNOSIS — E559 Vitamin D deficiency, unspecified: Secondary | ICD-10-CM | POA: Diagnosis not present

## 2023-09-01 DIAGNOSIS — E663 Overweight: Secondary | ICD-10-CM | POA: Diagnosis not present

## 2023-09-01 DIAGNOSIS — Z8739 Personal history of other diseases of the musculoskeletal system and connective tissue: Secondary | ICD-10-CM | POA: Diagnosis not present

## 2023-09-01 DIAGNOSIS — D649 Anemia, unspecified: Secondary | ICD-10-CM | POA: Diagnosis not present

## 2023-09-01 DIAGNOSIS — M0600 Rheumatoid arthritis without rheumatoid factor, unspecified site: Secondary | ICD-10-CM | POA: Diagnosis not present

## 2023-09-01 DIAGNOSIS — M1991 Primary osteoarthritis, unspecified site: Secondary | ICD-10-CM | POA: Diagnosis not present

## 2023-09-01 DIAGNOSIS — Z840 Family history of diseases of the skin and subcutaneous tissue: Secondary | ICD-10-CM | POA: Diagnosis not present

## 2023-09-01 DIAGNOSIS — Z7952 Long term (current) use of systemic steroids: Secondary | ICD-10-CM | POA: Diagnosis not present

## 2023-09-09 ENCOUNTER — Ambulatory Visit (INDEPENDENT_AMBULATORY_CARE_PROVIDER_SITE_OTHER): Payer: Medicare Other

## 2023-09-09 DIAGNOSIS — I639 Cerebral infarction, unspecified: Secondary | ICD-10-CM | POA: Diagnosis not present

## 2023-09-11 LAB — CUP PACEART REMOTE DEVICE CHECK
Date Time Interrogation Session: 20241112024200
Implantable Pulse Generator Implant Date: 20230929
Pulse Gen Serial Number: 192101

## 2023-09-13 ENCOUNTER — Inpatient Hospital Stay: Payer: Medicare Other | Attending: Oncology

## 2023-09-13 ENCOUNTER — Inpatient Hospital Stay (HOSPITAL_BASED_OUTPATIENT_CLINIC_OR_DEPARTMENT_OTHER): Payer: Medicare Other | Admitting: Oncology

## 2023-09-13 ENCOUNTER — Encounter: Payer: Self-pay | Admitting: Oncology

## 2023-09-13 VITALS — BP 113/58 | HR 74 | Temp 98.2°F | Resp 18 | Ht 62.0 in | Wt 140.8 lb

## 2023-09-13 DIAGNOSIS — M069 Rheumatoid arthritis, unspecified: Secondary | ICD-10-CM | POA: Diagnosis not present

## 2023-09-13 DIAGNOSIS — D649 Anemia, unspecified: Secondary | ICD-10-CM | POA: Diagnosis not present

## 2023-09-13 DIAGNOSIS — I10 Essential (primary) hypertension: Secondary | ICD-10-CM | POA: Diagnosis not present

## 2023-09-13 DIAGNOSIS — E039 Hypothyroidism, unspecified: Secondary | ICD-10-CM | POA: Diagnosis not present

## 2023-09-13 LAB — FERRITIN: Ferritin: 28 ng/mL (ref 11–307)

## 2023-09-13 NOTE — Progress Notes (Signed)
  [facility] OFFICE PROGRESS NOTE   Diagnosis: Anemia  INTERVAL HISTORY:   [name] returns as scheduled.  She continues methotrexate for rheumatoid arthritis.  The Tritus is not active at present.  No complaint.  Objective:  Vital signs in last 24 hours:  Blood pressure (!) 113/58, pulse 74, temperature 98.2 F (36.8 C), temperature source Temporal, resp. rate 18, height 5\' 2"  (1.575 m), weight 140 lb 12.8 oz (63.9 kg), SpO2 100%.    HEENT: No thrush or ulcers Lymphatics: No cervical, supraclavicular, axillary, or inguinal nodes Resp: Lungs clear bilaterally Cardio: Regular rate and rhythm GI: No hepatosplenomegaly Vascular: No leg edema   Lab Results:  Lab Results  Component Value Date   WBC 8.9 05/10/2023   HGB 10.5 (L) 05/10/2023   HCT 32.1 (L) 05/10/2023   MCV 95.3 05/10/2023   PLT 285 05/10/2023   NEUTROABS 5.9 05/10/2023   09/01/2023: Hemoglobin 11.3 (11.1-15.9) hematocrit 35.7%, MCV 97, platelets 293,000, WBC 7.5, ANC 4.6 CMP  Lab Results  Component Value Date   NA 136 05/10/2023   K 4.3 05/10/2023   CL 104 05/10/2023   CO2 24 05/10/2023   GLUCOSE 119 (H) 05/10/2023   BUN 23 05/10/2023   CREATININE 1.00 05/10/2023   CALCIUM 8.9 05/10/2023   PROT 6.6 05/10/2023   ALBUMIN 4.1 05/10/2023   AST 19 05/10/2023   ALT 20 05/10/2023   ALKPHOS 50 05/10/2023   BILITOT 0.4 05/10/2023   GFRNONAA 52 (L) 05/10/2023   GFRAA >60 04/14/2016    No results found for: "CEA1", "CEA", "CAN199", "CA125"  Lab Results  Component Value Date   INR 1.0 07/22/2022   LABPROT 13.3 07/22/2022    Imaging:  No results found.  Medications: I have reviewed the patient's current medications.   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  The hemoglobin returned in the low end of the  normal range when checked by her primary provider 09/01/2023.  We will follow-up on the ferritin level from today.  [name] plans to continue clinical follow-up with her primary provider and rheumatology.  I am available to see her if she develops progressive anemia or a new hematologic abnormality.  She is not scheduled for a follow-up appointment in the hematology clinic.  Thornton Papas, MD  09/13/2023  3:17 PM

## 2023-09-14 ENCOUNTER — Telehealth: Payer: Self-pay

## 2023-09-14 NOTE — Telephone Encounter (Signed)
Patient gave verbal understanding and had no further questions or concerns  

## 2023-09-14 NOTE — Telephone Encounter (Signed)
-----   Message from Thornton Papas sent at 09/13/2023  4:53 PM EST ----- Please call patient, ferritin is normal, follow-up as needed

## 2023-09-22 NOTE — Progress Notes (Signed)
[facility] Summary Report / Loop Recorder 

## 2023-09-30 ENCOUNTER — Telehealth: Payer: Medicare Other

## 2023-09-30 NOTE — Telephone Encounter (Signed)
Patient and daughter called to change pharmacy I advised them both that it has been changed. Patient daughter states she thinks its an Academic [employer] issue

## 2023-10-08 ENCOUNTER — Encounter: Payer: Self-pay | Admitting: Physician Assistant

## 2023-10-11 ENCOUNTER — Ambulatory Visit (INDEPENDENT_AMBULATORY_CARE_PROVIDER_SITE_OTHER): Payer: Medicare Other

## 2023-10-11 DIAGNOSIS — I639 Cerebral infarction, unspecified: Secondary | ICD-10-CM

## 2023-10-11 LAB — CUP PACEART REMOTE DEVICE CHECK
Date Time Interrogation Session: 20241216054800
Implantable Pulse Generator Implant Date: 20230929
Pulse Gen Serial Number: 192101

## 2023-10-17 ENCOUNTER — Encounter (HOSPITAL_COMMUNITY): Payer: Self-pay

## 2023-10-17 ENCOUNTER — Emergency Department (HOSPITAL_COMMUNITY)
Admission: EM | Admit: 2023-10-17 | Discharge: 2023-10-17 | Disposition: A | Discharge status: Home / Self Care | Payer: Medicare Other | Attending: Emergency Medicine | Admitting: Emergency Medicine

## 2023-10-17 ENCOUNTER — Other Ambulatory Visit: Payer: Self-pay

## 2023-10-17 DIAGNOSIS — E039 Hypothyroidism, unspecified: Secondary | ICD-10-CM | POA: Diagnosis not present

## 2023-10-17 DIAGNOSIS — I10 Essential (primary) hypertension: Secondary | ICD-10-CM | POA: Diagnosis not present

## 2023-10-17 DIAGNOSIS — Z79899 Other long term (current) drug therapy: Secondary | ICD-10-CM | POA: Insufficient documentation

## 2023-10-17 DIAGNOSIS — R197 Diarrhea, unspecified: Secondary | ICD-10-CM | POA: Diagnosis present

## 2023-10-17 DIAGNOSIS — E860 Dehydration: Secondary | ICD-10-CM | POA: Diagnosis not present

## 2023-10-17 LAB — COMPREHENSIVE METABOLIC PANEL
ALT: 27 U/L (ref 0–44)
AST: 26 U/L (ref 15–41)
Albumin: 3.8 g/dL (ref 3.5–5.0)
Alkaline Phosphatase: 54 U/L (ref 38–126)
Anion gap: 7 (ref 5–15)
BUN: 26 mg/dL — ABNORMAL HIGH (ref 8–23)
CO2: 21 mmol/L — ABNORMAL LOW (ref 22–32)
Calcium: 7.9 mg/dL — ABNORMAL LOW (ref 8.9–10.3)
Chloride: 107 mmol/L (ref 98–111)
Creatinine, Ser: 0.93 mg/dL (ref 0.44–1.00)
GFR, Estimated: 57 mL/min — ABNORMAL LOW (ref >60)
Glucose, Bld: 111 mg/dL — ABNORMAL HIGH (ref 70–99)
Potassium: 3.8 mmol/L (ref 3.5–5.1)
Sodium: 135 mmol/L (ref 135–145)
Total Bilirubin: 0.3 mg/dL (ref <1.2)
Total Protein: 6.5 g/dL (ref 6.5–8.1)

## 2023-10-17 LAB — CBC WITH DIFFERENTIAL/PLATELET
Abs Immature Granulocytes: 0.03 10*3/uL (ref 0.00–0.07)
Basophils Absolute: 0 10*3/uL (ref 0.0–0.1)
Basophils Relative: 0 %
Eosinophils Absolute: 0.1 10*3/uL (ref 0.0–0.5)
Eosinophils Relative: 1 %
HCT: 39.2 % (ref 36.0–46.0)
Hemoglobin: 12.7 g/dL (ref 12.0–15.0)
Immature Granulocytes: 1 %
Lymphocytes Relative: 14 %
Lymphs Abs: 0.8 10*3/uL (ref 0.7–4.0)
MCH: 31.1 pg (ref 26.0–34.0)
MCHC: 32.4 g/dL (ref 30.0–36.0)
MCV: 95.8 fL (ref 80.0–100.0)
Monocytes Absolute: 0.6 10*3/uL (ref 0.1–1.0)
Monocytes Relative: 11 %
Neutro Abs: 4.1 10*3/uL (ref 1.7–7.7)
Neutrophils Relative %: 73 %
Platelets: 259 10*3/uL (ref 150–400)
RBC: 4.09 MIL/uL (ref 3.87–5.11)
RDW: 14.4 % (ref 11.5–15.5)
WBC: 5.5 10*3/uL (ref 4.0–10.5)
nRBC: 0 % (ref 0.0–0.2)

## 2023-10-17 LAB — URINALYSIS, W/ REFLEX TO CULTURE (INFECTION SUSPECTED)
Bacteria, UA: NONE SEEN
Bilirubin Urine: NEGATIVE
Glucose, UA: NEGATIVE mg/dL
Hgb urine dipstick: NEGATIVE
Ketones, ur: 5 mg/dL — AB
Leukocytes,Ua: NEGATIVE
Nitrite: NEGATIVE
Protein, ur: 30 mg/dL — AB
Specific Gravity, Urine: 1.023 (ref 1.005–1.030)
pH: 5 (ref 5.0–8.0)

## 2023-10-17 LAB — LIPASE, BLOOD: Lipase: 36 U/L (ref 11–51)

## 2023-10-17 LAB — TROPONIN I (HIGH SENSITIVITY): Troponin I (High Sensitivity): 10 ng/L (ref <18)

## 2023-10-17 MED ORDER — ONDANSETRON 4 MG PO TBDP: 4.0000 mg | ORAL_TABLET | Freq: Three times a day (TID) | ORAL | 0 refills | Status: DC | PRN

## 2023-10-17 MED ORDER — CALCIUM GLUCONATE-NACL 1-0.675 GM/50ML-% IV SOLN
1.0000 g | Freq: Once | INTRAVENOUS | Status: AC
  Administered 2023-10-17: 1000 mg via INTRAVENOUS
  Filled 2023-10-17: qty 50

## 2023-10-17 MED ORDER — SODIUM CHLORIDE 0.9 % IV BOLUS
1000.0000 mL | Freq: Once | INTRAVENOUS | Status: AC
  Administered 2023-10-17: 1000 mL via INTRAVENOUS

## 2023-10-17 MED ORDER — ONDANSETRON HCL 4 MG/2ML IJ SOLN
4.0000 mg | Freq: Once | INTRAMUSCULAR | Status: DC
  Filled 2023-10-17: qty 2

## 2023-10-17 NOTE — ED Provider Notes (Signed)
EMERGENCY DEPARTMENT AT [facility] Provider Note   CSN: [id number] Arrival date & time: 10/17/23  1511     History  Chief Complaint  Patient presents with   Diarrhea        Weakness    [name] is a 89 y.o. female.   Diarrhea Weakness Associated symptoms: diarrhea   Today is Sunday.  On Thursday patient developed nausea vomiting some diarrhea.  Had been doing a little better yesterday but today feeling even worse.  Difficulty getting out of bed.  Presents with daughter.  No fevers.  Decreased oral intake.  No dysuria.  No abdominal pain. Patient also had a near syncopal episode when she was putting her partials in and began to gag.  Felt lightheaded.  Hands cramped up.  Better now.  Thank    Past Medical History:  Diagnosis Date   Arthritis    Carpal tunnel syndrome    Gait abnormality    has for 47yr-no use of cane   Hx of TIA (transient ischemic attack) and stroke    Hyperlipidemia    Hypertension    Hypothyroidism    Osteoporosis    Wears glasses    Wears hearing aid     Home Medications Prior to Admission medications   Medication Sig Start Date End Date Taking? Authorizing Provider  ondansetron (ZOFRAN-ODT) 4 MG disintegrating tablet Take 1 tablet (4 mg total) by mouth every 8 (eight) hours as needed for nausea or vomiting. 10/17/23  Yes Benjiman Core, MD  acetaminophen (TYLENOL) 500 MG tablet Take 500 mg by mouth every 6 (six) hours as needed.    [provider]  amLODipine (NORVASC) 5 MG tablet Take 1 tablet (5 mg total) by mouth daily. 08/02/23   Fargo, Amy E, NP  atorvastatin (LIPITOR) 10 MG tablet Take 1 tablet (10 mg total) by mouth daily. 08/02/23   Fargo, Amy E, NP  Cholecalciferol (VITAMIN D-3 PO) Take 800 Units by mouth as directed.    [provider]  clopidogrel (PLAVIX) 75 MG tablet Take 1 tablet (75 mg total) by mouth daily. 08/02/23   Fargo, Amy E, NP  cyanocobalamin 1000 MCG tablet Take 1,000 mcg  by mouth daily.    [provider]  denosumab (PROLIA) 60 MG/ML SOSY injection Inject 60 mg into the skin every 6 (six) months.    [provider]  diclofenac Sodium (VOLTAREN) 1 % GEL Apply 2 g topically 4 (four) times daily as needed. 07/21/23   Medina-Vargas, Monina C, NP  diphenhydrAMINE (BENADRYL) 25 MG tablet Take 12.5 mg by mouth at bedtime as needed for sleep or allergies.    [provider]  famotidine (PEPCID) 40 MG tablet Take 40 mg by mouth daily.    [provider]  folic acid (FOLVITE) 1 MG tablet Take 1 mg by mouth daily. 03/18/21   [provider]  hydrALAZINE (APRESOLINE) 25 MG tablet Take 1 tablet (25 mg total) by mouth 3 (three) times daily. 08/02/23   Fargo, Amy E, NP  isosorbide mononitrate (IMDUR) 60 MG 24 hr tablet Take 1 tablet (60 mg total) by mouth daily. 08/02/23 07/27/24  Octavia Heir, NP  levothyroxine (SYNTHROID) 112 MCG tablet Take 1 tablet (112 mcg total) by mouth every morning. 08/02/23   Fargo, Amy E, NP  losartan (COZAAR) 100 MG tablet Take 1 tablet (100 mg total) by mouth daily. 08/02/23   Fargo, Amy E, NP  methotrexate (RHEUMATREX) 2.5 MG tablet Take  7 tablets by mouth once a week.    [provider]  Methylcellulose, Laxative, (CITRUCEL PO) Take by mouth as needed.    [provider]  metoprolol succinate (TOPROL-XL) 50 MG 24 hr tablet Take 1 tablet (50 mg total) by mouth daily. Take with or immediately following a meal. 08/02/23   Fargo, Amy E, NP  Multiple Vitamins-Minerals (PRESERVISION AREDS PO) Take 1 tablet by mouth 2 (two) times daily.    [provider]  omeprazole (PRILOSEC) 40 MG capsule Take 1 capsule by mouth daily.    [provider]  predniSONE (DELTASONE) 1 MG tablet Take 3 mg by mouth daily. 05/12/17   [provider]      Allergies    Pollen extract and Levofloxacin    Review of Systems   Review of Systems  Gastrointestinal:  Positive for diarrhea.   Neurological:  Positive for weakness.    Physical Exam Updated Vital Signs BP 134/60   Pulse 81   Temp 97.8 F (36.6 C) (Oral)   Resp 19   SpO2 100%  Physical Exam Vitals and nursing note reviewed.  HENT:     Head: Normocephalic.  Cardiovascular:     Rate and Rhythm: Regular rhythm.  Pulmonary:     Breath sounds: No wheezing or rhonchi.  Chest:     Chest wall: No tenderness.  Abdominal:     Tenderness: There is no abdominal tenderness.  Musculoskeletal:     Cervical back: Neck supple.  Skin:    Capillary Refill: Capillary refill takes less than 2 seconds.  Neurological:     Mental Status: She is alert and oriented to person, place, and time.     ED Results / Procedures / Treatments   Labs (all labs ordered are listed, but only abnormal results are displayed) Labs Reviewed  COMPREHENSIVE METABOLIC PANEL - Abnormal; Notable for the following components:      Result Value   CO2 21 (*)    Glucose, Bld 111 (*)    BUN 26 (*)    Calcium 7.9 (*)    GFR, Estimated 57 (*)    All other components within normal limits  URINALYSIS, W/ REFLEX TO CULTURE (INFECTION SUSPECTED) - Abnormal; Notable for the following components:   Ketones, ur 5 (*)    Protein, ur 30 (*)    All other components within normal limits  LIPASE, BLOOD  CBC WITH DIFFERENTIAL/PLATELET  TROPONIN I (HIGH SENSITIVITY)    EKG EKG Interpretation Date/Time:  Sunday October 17 2023 16:03:58 EST Ventricular Rate:  75 PR Interval:  184 QRS Duration:  130 QT Interval:  445 QTC Calculation: 498 R Axis:   -47  Text Interpretation: Sinus rhythm Probable left atrial enlargement Left bundle branch block No significant change since last tracing Confirmed by Benjiman Core [phone]) on 10/17/2023 6:02:23 PM  Radiology No results found.  Procedures Procedures    Medications Ordered in ED Medications  ondansetron (ZOFRAN) injection 4 mg (4 mg Intravenous Patient Refused/Not Given 10/17/23 1630)   sodium chloride 0.9 % bolus 1,000 mL (0 mLs Intravenous Stopped 10/17/23 2142)  calcium gluconate 1 g/ 50 mL sodium chloride IVPB (0 mg Intravenous Stopped 10/17/23 2143)    ED Course/ Medical Decision Making/ A&P                                 Medical Decision Making Amount and/or Complexity of Data Reviewed Labs: ordered.  Risk Prescription drug management.   Patient with nausea vomiting generalized weakness.  Feeling bad.  Decreased energy level.  Blood work overall reassuring but does have a hypocalcemia.  BUN mildly elevated.  Consistent with dehydration.  Urine does not show severe dehydration.  Feeling better after fluids and tolerating orals.  Will discharge home.        Final Clinical Impression(s) / ED Diagnoses Final diagnoses:  Dehydration  Hypocalcemia    Rx / DC Orders ED Discharge Orders          Ordered    ondansetron (ZOFRAN-ODT) 4 MG disintegrating tablet  Every 8 hours PRN        10/17/23 2023              Benjiman Core, MD 10/17/23 2318

## 2023-10-17 NOTE — ED Triage Notes (Signed)
Pt BIB EMS from [facility] due to diarrhea for the last 4 days and weakness. Pt daughter called EMS after seeing a decline in pt ADLs; pt unable to get of bed and is not eating. Pt is Hard of Hearing.   BP 128/60 HR 78 SpO2 98 CBG 153

## 2023-10-17 NOTE — ED Notes (Signed)
Ambulate Pt: Pt was able to ambulate successfully down the hall and back to room with no assistance.

## 2023-10-22 ENCOUNTER — Non-Acute Institutional Stay: Payer: Medicare Other | Admitting: Sports Medicine

## 2023-10-22 ENCOUNTER — Encounter: Payer: Self-pay | Admitting: Sports Medicine

## 2023-10-22 VITALS — BP 124/88 | HR 82 | Temp 96.4°F | Resp 18 | Ht 62.0 in | Wt 135.6 lb

## 2023-10-22 DIAGNOSIS — K529 Noninfective gastroenteritis and colitis, unspecified: Secondary | ICD-10-CM | POA: Diagnosis not present

## 2023-10-22 NOTE — Progress Notes (Signed)
Careteam: Patient Care Team: Mast, Man X, NP as PCP - General (Internal Medicine)  PLACE OF SERVICE:  [facility]  Advanced Directive information Does Patient Have a Medical Advance Directive?: Yes, Type of Advance Directive: Out of facility DNR (pink MOST or yellow form), Pre-existing out of facility DNR order (yellow form or pink MOST form): Pink MOST form placed in chart (order not valid for inpatient use), Does patient want to make changes to medical advance directive?: No - Patient declined  Allergies  Allergen Reactions   Pollen Extract    Levofloxacin Other (See Comments)    Insomnia    Chief Complaint  Patient presents with   Acute Visit    diarrhea last week, had some diarrhea today    Discussed the use of AI scribe software for clinical note transcription with the patient, who gave verbal consent to proceed.  History of Present Illness            HPI: Patient is a 89 y.o. female presented to clinic for acute visit for diarrhea. The patient, with a history of chronic illnesses, presents with a recent episode of gastroenteritis. She reports having experienced diarrhea for three days, starting on a Thursday and leading to hospitalization for fluid replacement on the following Sunday. The diarrhea was described as watery and very yellow. The patient also reported some nausea, but no vomiting. Despite the cessation of diarrhea since the previous morning, the patient continues to feel weak and has a decreased appetite, consuming only soup, cereal, and crackers the previous day. She denies any dizziness or lightheadedness. The patient denies any abdominal pain or dysuria. She also denies any significant changes in her breathing pattern. The patient has been taking Imodium for the diarrhea and has been advised to stay hydrated.    Review of Systems:  Review of Systems  Constitutional:  Negative for chills and fever.  HENT:  Negative for congestion and sore throat.   Eyes:   Negative for double vision.  Respiratory:  Negative for cough, sputum production and shortness of breath.   Cardiovascular:  Negative for chest pain, palpitations and leg swelling.  Gastrointestinal:  Positive for diarrhea and nausea. Negative for abdominal pain and heartburn.  Genitourinary:  Negative for dysuria, frequency and hematuria.  Musculoskeletal:  Negative for falls and myalgias.  Neurological:  Negative for dizziness, sensory change and focal weakness.   Negative unless indicated in HPI.   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   Hx of TIA (transient ischemic attack) and stroke    Hyperlipidemia    Hypertension    Hypothyroidism    Osteoporosis    Wears glasses    Wears hearing aid    Past Surgical History:  Procedure Laterality Date   APPENDECTOMY     CARPAL TUNNEL RELEASE Left 07/11/2013   Procedure: CARPAL TUNNEL RELEASE;  Surgeon: Wyn Forster., MD;  Location: [facility];  Service: Orthopedics;  Laterality: Left;   COLONOSCOPY     LOOP RECORDER INSERTION N/A 07/24/2022   Procedure: LOOP RECORDER INSERTION;  Surgeon: Lanier Prude, MD;  Location: [facility];  Service: Cardiovascular;  Laterality: N/A;   SUBDURAL HEMATOMA EVACUATION VIA CRANIOTOMY  2007   TONSILLECTOMY     Social History:   reports that she has never smoked. She has never used smokeless tobacco. She  reports current alcohol use of about 1.0 standard drink of alcohol per week. She reports that she does not use drugs.  Family History  Problem Relation Age of Onset   Cancer Mother 55       breast ca   Heart attack Father    Other Father        Heavy smoker   Kidney cancer Sister    Breast cancer Sister    Breast cancer Sister    Melanoma Sister    Healthy Sister    Cancer Brother        pancreatic ca   Brain cancer Brother        astrocytoma?   Cancer Maternal Aunt      Medications: Patient's Medications  New Prescriptions   No medications on file  Previous Medications   ACETAMINOPHEN (TYLENOL) 500 MG TABLET    Take 500 mg by mouth every 6 (six) hours as needed.   AMLODIPINE (NORVASC) 5 MG TABLET    Take 1 tablet (5 mg total) by mouth daily.   ATORVASTATIN (LIPITOR) 10 MG TABLET    Take 1 tablet (10 mg total) by mouth daily.   CHOLECALCIFEROL (VITAMIN D-3 PO)    Take 800 Units by mouth as directed.   CLOPIDOGREL (PLAVIX) 75 MG TABLET    Take 1 tablet (75 mg total) by mouth daily.   CYANOCOBALAMIN 1000 MCG TABLET    Take 1,000 mcg by mouth daily.   DENOSUMAB (PROLIA) 60 MG/ML SOSY INJECTION    Inject 60 mg into the skin every 6 (six) months.   DICLOFENAC SODIUM (VOLTAREN) 1 % GEL    Apply 2 g topically 4 (four) times daily as needed.   DIPHENHYDRAMINE (BENADRYL) 25 MG TABLET    Take 12.5 mg by mouth at bedtime as needed for sleep or allergies.   FAMOTIDINE (PEPCID) 40 MG TABLET    Take 40 mg by mouth daily.   FOLIC ACID (FOLVITE) 1 MG TABLET    Take 1 mg by mouth daily.   HYDRALAZINE (APRESOLINE) 25 MG TABLET    Take 1 tablet (25 mg total) by mouth 3 (three) times daily.   ISOSORBIDE MONONITRATE (IMDUR) 60 MG 24 HR TABLET    Take 1 tablet (60 mg total) by mouth daily.   LEVOTHYROXINE (SYNTHROID) 112 MCG TABLET    Take 1 tablet (112 mcg total) by mouth every morning.   LOSARTAN (COZAAR) 100 MG TABLET    Take 1 tablet (100 mg total) by mouth daily.   METHOTREXATE (RHEUMATREX) 2.5 MG TABLET    Take 7 tablets by mouth once a week.   METHYLCELLULOSE, LAXATIVE, (CITRUCEL PO)    Take by mouth as needed.   METOPROLOL SUCCINATE (TOPROL-XL) 50 MG 24 HR TABLET    Take 1 tablet (50 mg total) by mouth daily. Take with or immediately following a meal.   MULTIPLE VITAMINS-MINERALS (PRESERVISION AREDS PO)    Take 1 tablet by mouth 2 (two) times daily.   OMEPRAZOLE (PRILOSEC) 40 MG CAPSULE    Take 1 capsule by mouth daily.   ONDANSETRON (ZOFRAN-ODT) 4 MG DISINTEGRATING  TABLET    Take 1 tablet (4 mg total) by mouth every 8 (eight) hours as needed for nausea or vomiting.   PREDNISONE (DELTASONE) 1 MG TABLET    Take 3 mg by mouth daily.  Modified Medications   No medications on file  Discontinued Medications   No medications on file    Physical Exam: Vitals:   10/22/23  0822  BP: 124/88  Pulse: 82  Resp: 18  Temp: (!) 96.4 F (35.8 C)  SpO2: 99%  Weight: 135 lb 9.6 oz (61.5 kg)  Height: 5\' 2"  (1.575 m)   Body mass index is 24.8 kg/m. BP Readings from Last 3 Encounters:  10/22/23 124/88  10/17/23 134/60  09/13/23 (!) 113/58   Wt Readings from Last 3 Encounters:  10/22/23 135 lb 9.6 oz (61.5 kg)  09/13/23 140 lb 12.8 oz (63.9 kg)  07/21/23 142 lb 6.4 oz (64.6 kg)    Physical Exam Constitutional:      Appearance: Normal appearance.  Cardiovascular:     Rate and Rhythm: Normal rate and regular rhythm.  Pulmonary:     Effort: Pulmonary effort is normal. No respiratory distress.     Breath sounds: Normal breath sounds. No wheezing.  Abdominal:     General: Bowel sounds are normal. There is no distension.     Tenderness: There is no abdominal tenderness. There is no guarding or rebound.     Comments:    Musculoskeletal:        General: No swelling or tenderness.  Neurological:     Mental Status: She is alert. Mental status is at baseline.     Sensory: No sensory deficit.     Motor: No weakness.     Labs reviewed: Basic Metabolic Panel: Recent Labs    02/09/23 1354 05/10/23 1431 10/17/23 1542  NA 135 136 135  K 4.2 4.3 3.8  CL 103 104 107  CO2 19* 24 21*  GLUCOSE 128* 119* 111*  BUN 21 23 26*  CREATININE 1.05* 1.00 0.93  CALCIUM 8.9 8.9 7.9*   Liver Function Tests: Recent Labs    02/09/23 1354 05/10/23 1431 10/17/23 1542  AST 24 19 26   ALT 19 20 27   ALKPHOS 46 50 54  BILITOT 0.5 0.4 0.3  PROT 6.1* 6.6 6.5  ALBUMIN 4.1 4.1 3.8   Recent Labs    10/17/23 1542  LIPASE 36   No results for input(s): "AMMONIA" in  the last 8760 hours. CBC: Recent Labs    02/09/23 1328 05/10/23 1431 10/17/23 1542  WBC 8.4 8.9 5.5  NEUTROABS 5.5 5.9 4.1  HGB 10.7* 10.5* 12.7  HCT 32.9* 32.1* 39.2  MCV 94.8 95.3 95.8  PLT 262 285 259   Lipid Panel: No results for input(s): "CHOL", "HDL", "LDLCALC", "TRIG", "CHOLHDL", "LDLDIRECT" in the last 8760 hours. TSH: No results for input(s): "TSH" in the last 8760 hours. A1C: Lab Results  Component Value Date   HGBA1C 5.9 (H) 07/24/2022     Assessment/Plan Gastroenteritis Diarrhea for several days, now improving. No current nausea or vomiting. No abdominal pain. No fever. No changes in urination. -Continue Imodium as needed for diarrhea, up to 4 tablets per day. -Stay hydrated with plenty of water. -Continue Zofran as needed for nausea. -Add probiotics to diet. Follow-up if symptoms worsen or do not continue to improve.  No follow-ups on file.:

## 2023-10-28 ENCOUNTER — Encounter: Payer: Medicare Other | Admitting: Nurse Practitioner

## 2023-10-28 ENCOUNTER — Ambulatory Visit: Payer: Medicare Other

## 2023-10-29 ENCOUNTER — Telehealth: Payer: Self-pay | Admitting: *Deleted

## 2023-10-29 DIAGNOSIS — M810 Age-related osteoporosis without current pathological fracture: Secondary | ICD-10-CM

## 2023-10-29 MED ORDER — DENOSUMAB 60 MG/ML ~~LOC~~ SOSY
60.0000 mg | PREFILLED_SYRINGE | Freq: Once | SUBCUTANEOUS | Status: AC
  Administered 2023-12-27: 60 mg via SUBCUTANEOUS

## 2023-10-29 NOTE — Telephone Encounter (Signed)
 Patient was on Prolia Previously and last injection was 04/26/2023. Manxie wants Verification sent through our office.   Added patient to [employer] Database and Verification sent.  Workque order placed.

## 2023-11-02 MED ORDER — OMEPRAZOLE 40 MG PO CPDR: 40.0000 mg | DELAYED_RELEASE_CAPSULE | Freq: Every day | ORAL | 3 refills | Status: DC

## 2023-11-02 NOTE — Telephone Encounter (Signed)
 High risk or very high risk warning populated when attempting to refill medication. RX request sent to PCP for review and approval if warranted.

## 2023-11-11 NOTE — Telephone Encounter (Addendum)
Received Verification Through [employer] and patient has No Copay, No PA.   Will call patient to schedule an appointment.

## 2023-11-15 ENCOUNTER — Ambulatory Visit (INDEPENDENT_AMBULATORY_CARE_PROVIDER_SITE_OTHER): Payer: Medicare Other

## 2023-11-15 DIAGNOSIS — I639 Cerebral infarction, unspecified: Secondary | ICD-10-CM

## 2023-11-15 LAB — CUP PACEART REMOTE DEVICE CHECK
Date Time Interrogation Session: 20250120055500
Implantable Pulse Generator Implant Date: 20230929
Pulse Gen Serial Number: 192101

## 2023-11-16 ENCOUNTER — Encounter: Payer: Self-pay | Admitting: Cardiology

## 2023-11-16 NOTE — Progress Notes (Signed)
Bsx Loop Recorder

## 2023-11-23 ENCOUNTER — Telehealth: Payer: Self-pay | Admitting: *Deleted

## 2023-11-23 NOTE — Telephone Encounter (Signed)
Tried calling patient to Reschedule Prolia Injection until a later date. LMOM to return call.   Awaiting to hear back from [facility], NP

## 2023-11-23 NOTE — Telephone Encounter (Signed)
Patient has an appointment to get her Prolia Injection tomorrow 11/24/2023.   On 10/17/2023 Her Calcium level was Low at 7.9  Is it ok for her to get her Prolia Injection tomorrow?  Please Advise.

## 2023-11-23 NOTE — Telephone Encounter (Signed)
Called and spoke with Claris Che, daughter, about rescheduling Prolia Injection due to patient needing labwork. Calcium level was low in the Hospital labs.  Told daughter that I would call her back once [facility] orders the labs to schedule a lab appointment at [facility].   Daughter stated that she is going to check into the Mail order pharmacy about getting the Prolia through the Mail Order Pharmacy for the Independent nurse at [facility] to give patient the injection at home. Daughter stated that she would call back to let us know if we need to send a Rx or reschedule the Prolia Injection.

## 2023-11-24 ENCOUNTER — Ambulatory Visit: Payer: Medicare Other

## 2023-11-24 MED ORDER — FAMOTIDINE 40 MG PO TABS: 40.0000 mg | ORAL_TABLET | Freq: Every day | ORAL | 3 refills | Status: DC

## 2023-11-24 NOTE — Telephone Encounter (Signed)
High risk or very high risk warning populated when attempting to refill medication. RX request sent to PCP for review and approval if warranted.

## 2023-11-25 DIAGNOSIS — I10 Essential (primary) hypertension: Secondary | ICD-10-CM

## 2023-11-26 NOTE — Telephone Encounter (Signed)
Message sent to patient with Updated Lab appointment at [facility] on 12/21/2023 at 7:30am

## 2023-11-26 NOTE — Telephone Encounter (Signed)
Mast, Man X, NP      Calcium 7.9 is too low for Prolia inj, recommend the patient to take Cal 1200mg  and Vit D 2000u daily, re check BMP on 4 weeks to see if serum calcium level is better.  Thank you      Message sent to patient.  Lab appointment scheduled for 12/20/2023

## 2023-12-13 ENCOUNTER — Other Ambulatory Visit: Payer: Self-pay

## 2023-12-13 DIAGNOSIS — I10 Essential (primary) hypertension: Secondary | ICD-10-CM

## 2023-12-16 ENCOUNTER — Other Ambulatory Visit: Payer: Medicare Other

## 2023-12-16 DIAGNOSIS — I10 Essential (primary) hypertension: Secondary | ICD-10-CM | POA: Diagnosis not present

## 2023-12-16 LAB — BASIC METABOLIC PANEL
BUN/Creatinine Ratio: 26 (calc) — ABNORMAL HIGH (ref 6–22)
BUN: 25 mg/dL (ref 7–25)
CO2: 27 mmol/L (ref 20–32)
Calcium: 9.3 mg/dL (ref 8.6–10.4)
Chloride: 103 mmol/L (ref 98–110)
Creat: 0.97 mg/dL — ABNORMAL HIGH (ref 0.60–0.95)
Glucose, Bld: 97 mg/dL (ref 65–99)
Potassium: 4.2 mmol/L (ref 3.5–5.3)
Sodium: 138 mmol/L (ref 135–146)

## 2023-12-20 ENCOUNTER — Other Ambulatory Visit: Payer: Medicare Other

## 2023-12-20 ENCOUNTER — Ambulatory Visit (INDEPENDENT_AMBULATORY_CARE_PROVIDER_SITE_OTHER): Payer: Medicare Other

## 2023-12-20 DIAGNOSIS — I639 Cerebral infarction, unspecified: Secondary | ICD-10-CM

## 2023-12-20 LAB — CUP PACEART REMOTE DEVICE CHECK
Date Time Interrogation Session: 20250224030100
Implantable Pulse Generator Implant Date: 20230929
Pulse Gen Serial Number: 192101

## 2023-12-21 ENCOUNTER — Other Ambulatory Visit: Payer: Self-pay | Admitting: Internal Medicine

## 2023-12-21 ENCOUNTER — Encounter: Payer: Self-pay | Admitting: Cardiology

## 2023-12-21 ENCOUNTER — Other Ambulatory Visit: Payer: Medicare Other

## 2023-12-21 DIAGNOSIS — M0600 Rheumatoid arthritis without rheumatoid factor, unspecified site: Secondary | ICD-10-CM | POA: Diagnosis not present

## 2023-12-23 NOTE — Progress Notes (Signed)
 Bsx Loop Recorder

## 2023-12-26 LAB — CBC WITH DIFFERENTIAL/PLATELET
Absolute Lymphocytes: 1650 {cells}/uL (ref 850–3900)
Absolute Monocytes: 402 {cells}/uL (ref 200–950)
Basophils Absolute: 60 {cells}/uL (ref 0–200)
Basophils Relative: 1 %
Eosinophils Absolute: 168 {cells}/uL (ref 15–500)
Eosinophils Relative: 2.8 %
HCT: 33.8 % — ABNORMAL LOW (ref 35.0–45.0)
Hemoglobin: 10.5 g/dL — ABNORMAL LOW (ref 11.7–15.5)
MCH: 30.9 pg (ref 27.0–33.0)
MCHC: 31.1 g/dL — ABNORMAL LOW (ref 32.0–36.0)
MCV: 99.4 fL (ref 80.0–100.0)
MPV: 11.3 fL (ref 7.5–12.5)
Monocytes Relative: 6.7 %
Neutro Abs: 3720 {cells}/uL (ref 1500–7800)
Neutrophils Relative %: 62 %
Platelets: 253 10*3/uL (ref 140–400)
RBC: 3.4 10*6/uL — ABNORMAL LOW (ref 3.80–5.10)
RDW: 14.1 % (ref 11.0–15.0)
Total Lymphocyte: 27.5 %
WBC: 6 10*3/uL (ref 3.8–10.8)

## 2023-12-26 LAB — COMPLETE METABOLIC PANEL WITH GFR
AG Ratio: 1.8 (calc) (ref 1.0–2.5)
ALT: 17 U/L (ref 6–29)
AST: 15 U/L (ref 10–35)
Albumin: 3.8 g/dL (ref 3.6–5.1)
Alkaline phosphatase (APISO): 63 U/L (ref 37–153)
BUN: 20 mg/dL (ref 7–25)
CO2: 27 mmol/L (ref 20–32)
Calcium: 9.1 mg/dL (ref 8.6–10.4)
Chloride: 105 mmol/L (ref 98–110)
Creat: 0.95 mg/dL (ref 0.60–0.95)
Globulin: 2.1 g/dL (ref 1.9–3.7)
Glucose, Bld: 90 mg/dL (ref 65–99)
Potassium: 3.9 mmol/L (ref 3.5–5.3)
Sodium: 139 mmol/L (ref 135–146)
Total Bilirubin: 0.6 mg/dL (ref 0.2–1.2)
Total Protein: 5.9 g/dL — ABNORMAL LOW (ref 6.1–8.1)
eGFR: 56 mL/min/{1.73_m2} — ABNORMAL LOW (ref >=60)

## 2023-12-26 LAB — EXTRA LAV TOP TUBE

## 2023-12-26 LAB — TEST AUTHORIZATION 2

## 2023-12-27 ENCOUNTER — Ambulatory Visit: Payer: Medicare Other | Admitting: Family

## 2023-12-27 DIAGNOSIS — M810 Age-related osteoporosis without current pathological fracture: Secondary | ICD-10-CM | POA: Diagnosis not present

## 2023-12-27 NOTE — Progress Notes (Signed)
 Patient is in office today for a nurse visit for  Prolia Injection . Patient Injection was given in the  Left arm. Patient tolerated injection well.

## 2024-01-24 ENCOUNTER — Ambulatory Visit: Payer: Medicare Other

## 2024-01-24 DIAGNOSIS — I639 Cerebral infarction, unspecified: Secondary | ICD-10-CM

## 2024-01-24 DIAGNOSIS — E039 Hypothyroidism, unspecified: Secondary | ICD-10-CM | POA: Diagnosis not present

## 2024-01-24 DIAGNOSIS — H903 Sensorineural hearing loss, bilateral: Secondary | ICD-10-CM | POA: Diagnosis not present

## 2024-01-24 DIAGNOSIS — Z822 Family history of deafness and hearing loss: Secondary | ICD-10-CM | POA: Diagnosis not present

## 2024-01-25 LAB — CUP PACEART REMOTE DEVICE CHECK
Date Time Interrogation Session: 20250331020900
Implantable Pulse Generator Implant Date: 20230929
Pulse Gen Serial Number: 192101

## 2024-01-25 NOTE — Telephone Encounter (Signed)
 Spoke with patient daughter and she had no questions or concerns at this time.

## 2024-01-26 NOTE — Progress Notes (Signed)
 Bsx Loop Recorder

## 2024-01-28 ENCOUNTER — Encounter: Payer: Self-pay | Admitting: Cardiology

## 2024-02-04 MED ORDER — LOSARTAN POTASSIUM 100 MG PO TABS: 100.0000 mg | ORAL_TABLET | Freq: Every day | ORAL | 1 refills | Status: DC

## 2024-02-15 DIAGNOSIS — I10 Essential (primary) hypertension: Secondary | ICD-10-CM

## 2024-02-15 MED ORDER — HYDRALAZINE HCL 25 MG PO TABS: 25.0000 mg | ORAL_TABLET | Freq: Three times a day (TID) | ORAL | 2 refills | Status: DC

## 2024-02-23 ENCOUNTER — Encounter: Payer: Self-pay | Admitting: Neurology

## 2024-02-23 ENCOUNTER — Ambulatory Visit (INDEPENDENT_AMBULATORY_CARE_PROVIDER_SITE_OTHER): Payer: Medicare Other | Admitting: Neurology

## 2024-02-23 VITALS — BP 137/75 | HR 72 | Ht 62.0 in | Wt 137.0 lb

## 2024-02-23 DIAGNOSIS — G3184 Mild cognitive impairment, so stated: Secondary | ICD-10-CM | POA: Diagnosis not present

## 2024-02-23 DIAGNOSIS — Z8673 Personal history of transient ischemic attack (TIA), and cerebral infarction without residual deficits: Secondary | ICD-10-CM

## 2024-02-23 DIAGNOSIS — I639 Cerebral infarction, unspecified: Secondary | ICD-10-CM | POA: Insufficient documentation

## 2024-02-23 NOTE — Progress Notes (Signed)
 [facility] [address] [place]. [place] [address] [phone]       OFFICE FOLLOW-UP NOTE  [name] Date of Birth:  [birth year] Medical Record Number:  [id number]   HPI: Initial visit 08/25/2022 [name] is a pleasant 89 year old Caucasian lady seen today for initial office follow-up visit following hospital consultation for stroke in September 2023.  She is accompanied by her daughter today.  History is obtained from them and review of electronic medical records and I have personally reviewed pertinent available imaging films in PACS.[name] is a 89 y.o. female with a past medical history of PMR, seronegative RA, steroid-induced osteoporosis, hypertension, hypothyroidism, TIA, staggered gait, hyperlipidemia who presented to the ED on 07/23/2022 with blurry vision and balance issues.She states on Monday she noticed that her gait was off and then she discovered she couldn't read. She was traveling in a car for 5 hours and when she got out she was very unsteady.  Her daughters also stated that she had some confusion getting undressed for bed at night and then redressed in the morning. She does have gait dysfunction at baseline, however it was worse on Monday.  Ms. Beilstein also stated that she has had a hard time reading since Monday, she says she has trouble staying on the same line in the book she was reading. On Wednesday morning they came back home and went to the ED for evaluation.  MRI scan of the brain showed multiple small acute infarcts in the bilateral parietal, occipital and frontal lobes as well as left temporal lobes, bilateral thalami and basal ganglia and bilateral cerebellar hemispheres.  CT angiogram study of the brain and neck did not show large vessel stenosis or occlusion.  Echocardiogram showed ejection fraction 60-65%.  No cardiac source of embolism but left atrium was moderately dilated.  LDL cholesterol was 63 mg percent He was 5.9.  Patient was  started on dual antiplatelet therapy aspirin  and Plavix  for 3 weeks followed by Plavix  alone.  He has a loop recorder inserted and so far paroxysmal A-fib has not yet been found.  Patient states she is done well having recently is much better and she can repeat in fine planes now.  Balance is also improved.  She has to be careful and hold onto things.  Uses cane and to.  She is scheduled for outpatient physical therapy.  She is tolerating Plavix  well without bruising or bleeding.. She had no recurrent TIA or stroke symptoms.  She is a [facility]-sided [facility] in independent living section.  Loop recorder report on 08/23/2022 shows no evidence of A-fib so far. Update 02/23/2023 : She returns for follow-up after last visit 5 months ago.  She is accompanied by her daughter [name].  Patient states that she is doing well without recurrent stroke or TIA symptoms.  Remains on Plavix  which is tolerating well without bruising or bleeding.  Her blood pressure is under good control.  She has a loop recorder and last interrogation on 02/01/2023 shows no evidence of paroxysmal A-fib yet.  She is tolerating Lipitor well without muscle aches and pains.  She continues to have mild short-term memory difficulties unchanged.  She has some occasional word finding difficulties and trouble finding words when speaking.  He has been doing balance exercises once a week and aerobics 3 days a week.  He feels the balance is still off but she has had no falls or injuries.  He complains of a poor vision from  macular degeneration and has noted some right shoulder and arm pain for the last week.  She has not yet discussed this with her primary physician. Update 02/23/2024 : She returns for follow-up after last visit 1 year ago.  She is accompanied by her daughter.  She states she she is doing well.  She has had no stroke or TIA symptoms.  She did have 2 brief episodes in June of last year of blurred vision which lasted 20 minutes which occurred  within a week and they were concerned about possibility of TIA.[name]  She did not have any other lateralizing focal neurological symptoms accompanying them.  Patient has a loop recorder and so far no paroxysmal A-fib has been found.  However she is bothered about the cost of the monthly monitoring which was going up to $216 per month.  She states her blood pressure is under good control.  She is tolerating Lipitor well without muscle aches and pains and Plavix  with only minor bruising and no bleeding.  She continues to have mild short-term memory difficulties but she and daughter both feel they are unchanged and not getting worse.  She is still living in independent living at friend's home.  She had new hearing aids recently due to her decreased hearing.  Screening carotid ultrasound 03/15/2023 showed no significant extracranial stenosis.  Last loop recorder interrogation on 01/24/2024 showed no evidence of A-fib ROS:   14 system review of systems is positive for shoulder pain, imbalance hearing loss, vision difficulties, gait difficulties, imbalance and all other systems negative  PMH:  Past Medical History:  Diagnosis Date   Arthritis    Carpal tunnel syndrome    Gait abnormality    has for 50yr-no use of cane   Hx of TIA (transient ischemic attack) and stroke    Hyperlipidemia    Hypertension    Hypothyroidism    Osteoporosis    Wears glasses    Wears hearing aid     Social History:  Social History   Socioeconomic History   Marital status: Widowed    Spouse name: Not on file   Number of children: 4   Years of education: Not on file   Highest education level: Not on file  Occupational History   Not on file  Tobacco Use   Smoking status: Never   Smokeless tobacco: Never  Vaping Use   Vaping status: Never Used  Substance and Sexual Activity   Alcohol use: Yes    Alcohol/week: 1.0 standard drink of alcohol    Types: 1 Glasses of wine per week    Comment: rarely   Drug use: No    Sexual activity: Not on file  Other Topics Concern   Not on file  Social History Narrative   Lives independently and 4 daughters live nearby and are supportive    Social Drivers of Health   Financial Resource Strain: Low Risk  (11/09/2022)   Overall Financial Resource Strain (CARDIA)    Difficulty of Paying Living Expenses: Not hard at all  Food Insecurity: No Food Insecurity (11/09/2022)   Hunger Vital Sign    Worried About Running Out of Food in the Last Year: Never true    Ran Out of Food in the Last Year: Never true  Transportation Needs: No Transportation Needs (11/09/2022)   PRAPARE - Administrator, Civil Service (Medical): No    Lack of Transportation (Non-Medical): No  Physical Activity: Sufficiently Active (11/09/2022)   Exercise Vital Sign  Days of Exercise per Week: 7 days    Minutes of Exercise per Session: 30 min  Stress: No Stress Concern Present (11/09/2022)   [name] of Occupational Health - Occupational Stress Questionnaire    Feeling of Stress : Not at all  Social Connections: Moderately Integrated (11/09/2022)   Social Connection and Isolation Panel [NHANES]    Frequency of Communication with Friends and Family: More than three times a week    Frequency of Social Gatherings with Friends and Family: More than three times a week    Attends Religious Services: More than 4 times per year    Active Member of [employer] or Organizations: Not on file    Attends Banker Meetings: More than 4 times per year    Marital Status: Widowed  Intimate Partner Violence: Not At Risk (11/09/2022)   Humiliation, Afraid, Rape, and Kick questionnaire    Fear of Current or Ex-Partner: No    Emotionally Abused: No    Physically Abused: No    Sexually Abused: No    Medications:   Current Outpatient Medications on File Prior to Visit  Medication Sig Dispense Refill   acetaminophen  (TYLENOL ) 500 MG tablet Take 500 mg by mouth every 6 (six) hours as needed.      amLODipine  (NORVASC ) 5 MG tablet Take 1 tablet (5 mg total) by mouth daily. 90 tablet 3   atorvastatin  (LIPITOR) 10 MG tablet Take 1 tablet (10 mg total) by mouth daily. 90 tablet 3   Cholecalciferol  (VITAMIN D -3 PO) Take 800 Units by mouth as directed.     clopidogrel  (PLAVIX ) 75 MG tablet Take 1 tablet (75 mg total) by mouth daily. 90 tablet 3   cyanocobalamin 1000 MCG tablet Take 1,000 mcg by mouth daily.     denosumab  (PROLIA ) 60 MG/ML SOSY injection Inject 60 mg into the skin every 6 (six) months.     diclofenac  Sodium (VOLTAREN ) 1 % GEL Apply 2 g topically 4 (four) times daily as needed. 100 g 3   diphenhydrAMINE  (BENADRYL ) 25 MG tablet Take 12.5 mg by mouth at bedtime as needed for sleep or allergies.     famotidine  (PEPCID ) 40 MG tablet Take 1 tablet (40 mg total) by mouth daily. 90 tablet 3   folic acid  (FOLVITE ) 1 MG tablet Take 1 mg by mouth daily.     hydrALAZINE  (APRESOLINE ) 25 MG tablet Take 1 tablet (25 mg total) by mouth 3 (three) times daily. 270 tablet 2   isosorbide  mononitrate (IMDUR ) 60 MG 24 hr tablet Take 1 tablet (60 mg total) by mouth daily. 90 tablet 3   levothyroxine  (SYNTHROID ) 112 MCG tablet Take 1 tablet (112 mcg total) by mouth every morning. 90 tablet 3   losartan  (COZAAR ) 100 MG tablet Take 1 tablet (100 mg total) by mouth daily. 90 tablet 1   methotrexate (RHEUMATREX) 2.5 MG tablet Take 7 tablets by mouth once a week.     Methylcellulose, Laxative, (CITRUCEL PO) Take by mouth as needed.     metoprolol  succinate (TOPROL -XL) 50 MG 24 hr tablet Take 1 tablet (50 mg total) by mouth daily. Take with or immediately following a meal. 90 tablet 3   Multiple Vitamins-Minerals (PRESERVISION AREDS PO) Take 1 tablet by mouth 2 (two) times daily.     omeprazole  (PRILOSEC) 40 MG capsule Take 1 capsule (40 mg total) by mouth daily. 90 capsule 3   predniSONE  (DELTASONE ) 1 MG tablet Take 3 mg by mouth daily.     No  current facility-administered medications on file prior to  visit.    Allergies:   Allergies  Allergen Reactions   Pollen Extract    Levofloxacin Other (See Comments)    Insomnia    Physical Exam General: well developed, well nourished pleasant elderly Caucasian lady, seated, in no evident distress Head: head normocephalic and atraumatic.  Neck: supple with no carotid or supraclavicular bruits Cardiovascular: regular rate and rhythm, no murmurs Musculoskeletal: no deformity Skin:  no rash/petichiae Vascular:  Normal pulses all extremities Vitals:   02/23/24 1501  BP: 137/75  Pulse: 72   Neurologic Exam Mental Status: Awake and fully alert. Oriented to place and time. Recent and remote memory intact. Attention span, concentration and fund of knowledge appropriate. Mood and affect appropriate.  Recall 2/3.  Clock drawing 4/4.  Able to name 7 animals which can walk on 4 legs Cranial Nerves: Fundoscopic exam not done. Pupils equal, briskly reactive to light. Extraocular movements full without nystagmus. Visual fields full to confrontation. Hearing diminished bilaterally. Facial sensation intact. Face, tongue, palate moves normally and symmetrically.  Motor: Normal bulk and tone. Normal strength in all tested extremity muscles. Sensory.: intact to touch ,pinprick .position and vibratory sensation.  Coordination: Rapid alternating movements normal in all extremities. Finger-to-nose and heel-to-shin performed accurately bilaterally. Gait and Station: Arises from chair without difficulty. Stance is slightly stooped.  Gait is cautious and slightly broad-based. Reflexes: 1+ and symmetric. Toes downgoing.       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  Vascular risk factors of hypertension, hyperlipidemia and age.  She is doing well from mild balance difficulties and age-appropriate mild cognitive impairment which appears stable     PLAN:I had a long d/w  patient and her daughter about her remote bicerebral embolioc strokes, risk for recurrent stroke/TIAs, personally independently reviewed imaging studies and stroke evaluation results and answered questions.Continue Plavix  75 mg daily  for secondary stroke prevention and maintain strict control of hypertension with blood pressure goal below 130/90, diabetes with hemoglobin A1c goal below 6.5% and lipids with LDL cholesterol goal below 70 mg/dL. I also advised the patient to eat a healthy diet with plenty of whole grains, cereals, fruits and vegetables, exercise regularly and maintain ideal body weight.Check screening carotid ultrasound. Advised to participate in cognitively challenging activities like solving crossword puzzles, playing bridge and sudoku.  We also discussed memory compensation strategies.  Followup in the future with me in 1 year or call earlier if needed.emory compensation strategies.  Followup in the future with me in 1 year or call earlier if needed..Greater than 50% of time during this 35 minute visit was spent on counseling,explanation of diagnosis, planning of further management, discussion with patient and family and coordination of care Ardella Beaver, MD Note: This document was prepared with digital dictation and possible smart phrase technology. Any transcriptional errors that result from this process are unintentional

## 2024-02-23 NOTE — Patient Instructions (Addendum)
 He

## 2024-02-28 ENCOUNTER — Ambulatory Visit: Payer: Medicare Other

## 2024-02-28 DIAGNOSIS — I639 Cerebral infarction, unspecified: Secondary | ICD-10-CM | POA: Diagnosis not present

## 2024-02-28 LAB — CUP PACEART REMOTE DEVICE CHECK
Date Time Interrogation Session: 20250505001500
Implantable Pulse Generator Implant Date: 20230929
Pulse Gen Serial Number: 192101

## 2024-02-29 DIAGNOSIS — M1991 Primary osteoarthritis, unspecified site: Secondary | ICD-10-CM | POA: Diagnosis not present

## 2024-02-29 DIAGNOSIS — Z8739 Personal history of other diseases of the musculoskeletal system and connective tissue: Secondary | ICD-10-CM | POA: Diagnosis not present

## 2024-02-29 DIAGNOSIS — D649 Anemia, unspecified: Secondary | ICD-10-CM | POA: Diagnosis not present

## 2024-02-29 DIAGNOSIS — E559 Vitamin D deficiency, unspecified: Secondary | ICD-10-CM | POA: Diagnosis not present

## 2024-02-29 DIAGNOSIS — M810 Age-related osteoporosis without current pathological fracture: Secondary | ICD-10-CM | POA: Diagnosis not present

## 2024-02-29 DIAGNOSIS — Z7952 Long term (current) use of systemic steroids: Secondary | ICD-10-CM | POA: Diagnosis not present

## 2024-02-29 DIAGNOSIS — M0600 Rheumatoid arthritis without rheumatoid factor, unspecified site: Secondary | ICD-10-CM | POA: Diagnosis not present

## 2024-02-29 DIAGNOSIS — Z6824 Body mass index (BMI) 24.0-24.9, adult: Secondary | ICD-10-CM | POA: Diagnosis not present

## 2024-02-29 DIAGNOSIS — Z840 Family history of diseases of the skin and subcutaneous tissue: Secondary | ICD-10-CM | POA: Diagnosis not present

## 2024-02-29 LAB — HEPATIC FUNCTION PANEL
ALT: 24 U/L (ref 7–35)
AST: 21 (ref 13–35)
Alkaline Phosphatase: 76 (ref 25–125)
Bilirubin, Total: 0.3

## 2024-02-29 LAB — CBC AND DIFFERENTIAL
HCT: 34 — AB (ref 36–46)
Hemoglobin: 10.9 — AB (ref 12.0–16.0)
Neutrophils Absolute: 4.8
Platelets: 299 10*3/uL (ref 150–400)
WBC: 7.5

## 2024-02-29 LAB — COMPREHENSIVE METABOLIC PANEL WITH GFR
Albumin: 4.1 (ref 3.5–5.0)
Globulin: 2.1
eGFR: 43

## 2024-02-29 LAB — BASIC METABOLIC PANEL WITH GFR
BUN: 18 (ref 4–21)
CO2: 24 — AB (ref 13–22)
Chloride: 101 (ref 99–108)
Creatinine: 1.2 — AB (ref 0.5–1.1)
Glucose: 96
Potassium: 4.4 meq/L (ref 3.5–5.1)
Sodium: 136 — AB (ref 137–147)

## 2024-02-29 LAB — CBC: RBC: 3.57 — AB (ref 3.87–5.11)

## 2024-02-29 LAB — POCT ERYTHROCYTE SEDIMENTATION RATE, NON-AUTOMATED: Sed Rate: 2

## 2024-03-01 ENCOUNTER — Encounter: Payer: Self-pay | Admitting: Nurse Practitioner

## 2024-03-01 ENCOUNTER — Encounter: Payer: Self-pay | Admitting: Cardiology

## 2024-03-09 NOTE — Addendum Note (Signed)
 Addended by: Edra Govern D on: 03/09/2024 01:19 PM   Modules accepted: Orders

## 2024-03-09 NOTE — Progress Notes (Signed)
 Clarity Loop Recorder

## 2024-04-03 ENCOUNTER — Ambulatory Visit: Payer: Self-pay | Admitting: Cardiology

## 2024-04-03 ENCOUNTER — Ambulatory Visit (INDEPENDENT_AMBULATORY_CARE_PROVIDER_SITE_OTHER): Payer: Medicare Other

## 2024-04-03 DIAGNOSIS — I639 Cerebral infarction, unspecified: Secondary | ICD-10-CM | POA: Diagnosis not present

## 2024-04-03 LAB — CUP PACEART REMOTE DEVICE CHECK
Date Time Interrogation Session: 20250609032200
Implantable Pulse Generator Implant Date: 20230929
Pulse Gen Serial Number: 192101

## 2024-04-10 ENCOUNTER — Telehealth

## 2024-04-10 NOTE — Telephone Encounter (Signed)
 Spoke with patients daughter, shared ManX's response, and she plans to speak with her mother and will call back to schedule appointment if she is in agreement

## 2024-04-10 NOTE — Telephone Encounter (Signed)
 Please assist

## 2024-04-10 NOTE — Telephone Encounter (Signed)
 Copied from CRM [id number]. Topic: Clinical - Medication Question >> Apr 10, 2024 10:54 AM Shelby Dessert H wrote: Reason for CRM: Patients daughter is calling to see if she could get something to take for her sinus congestion, patient does have a slight head cold. Patients daughter Daivd Dub) callback number is [phone].

## 2024-04-10 NOTE — Telephone Encounter (Signed)
 Spoke with patients daughter, patient performed an at home covid test last Thursday and it was negative. Patient is taking mucinex, cough has improved, however patient is still with a lot of congestion and would like to know if there is anything she can take that will not conflict with her current medications

## 2024-04-17 DIAGNOSIS — H35033 Hypertensive retinopathy, bilateral: Secondary | ICD-10-CM | POA: Diagnosis not present

## 2024-04-17 DIAGNOSIS — H35372 Puckering of macula, left eye: Secondary | ICD-10-CM | POA: Diagnosis not present

## 2024-04-17 DIAGNOSIS — H353132 Nonexudative age-related macular degeneration, bilateral, intermediate dry stage: Secondary | ICD-10-CM | POA: Diagnosis not present

## 2024-04-17 DIAGNOSIS — H43813 Vitreous degeneration, bilateral: Secondary | ICD-10-CM | POA: Diagnosis not present

## 2024-04-18 NOTE — Progress Notes (Signed)
 Bsx Loop Recorder

## 2024-05-04 ENCOUNTER — Ambulatory Visit

## 2024-05-04 DIAGNOSIS — I639 Cerebral infarction, unspecified: Secondary | ICD-10-CM

## 2024-05-04 LAB — CUP PACEART REMOTE DEVICE CHECK
Date Time Interrogation Session: 20250710052700
Implantable Pulse Generator Implant Date: 20230929
Pulse Gen Serial Number: 192101

## 2024-05-06 ENCOUNTER — Ambulatory Visit: Payer: Self-pay | Admitting: Cardiology

## 2024-05-08 ENCOUNTER — Ambulatory Visit: Payer: Medicare Other

## 2024-05-16 ENCOUNTER — Telehealth: Payer: Self-pay | Admitting: *Deleted

## 2024-05-16 DIAGNOSIS — M810 Age-related osteoporosis without current pathological fracture: Secondary | ICD-10-CM

## 2024-05-16 MED ORDER — DENOSUMAB 60 MG/ML ~~LOC~~ SOSY: 60.0000 mg | PREFILLED_SYRINGE | SUBCUTANEOUS | Status: AC

## 2024-05-16 NOTE — Telephone Encounter (Signed)
 Received Verification from [employer] for Prolia  No Copay, No PA Note added to upcoming appointment that labs needed for Prolia  injection due in August.

## 2024-05-17 NOTE — Progress Notes (Signed)
 Bsx Loop Recorder

## 2024-06-01 ENCOUNTER — Encounter: Payer: Self-pay | Admitting: Nurse Practitioner

## 2024-06-01 ENCOUNTER — Non-Acute Institutional Stay: Payer: Medicare Other | Admitting: Nurse Practitioner

## 2024-06-01 VITALS — BP 122/68 | HR 75 | Temp 97.6°F | Resp 12 | Ht 62.0 in

## 2024-06-01 DIAGNOSIS — E039 Hypothyroidism, unspecified: Secondary | ICD-10-CM | POA: Diagnosis not present

## 2024-06-01 DIAGNOSIS — I10 Essential (primary) hypertension: Secondary | ICD-10-CM

## 2024-06-01 DIAGNOSIS — I639 Cerebral infarction, unspecified: Secondary | ICD-10-CM | POA: Diagnosis not present

## 2024-06-01 DIAGNOSIS — F5101 Primary insomnia: Secondary | ICD-10-CM | POA: Diagnosis not present

## 2024-06-01 DIAGNOSIS — E559 Vitamin D deficiency, unspecified: Secondary | ICD-10-CM | POA: Diagnosis not present

## 2024-06-01 DIAGNOSIS — M810 Age-related osteoporosis without current pathological fracture: Secondary | ICD-10-CM

## 2024-06-01 DIAGNOSIS — M0600 Rheumatoid arthritis without rheumatoid factor, unspecified site: Secondary | ICD-10-CM | POA: Diagnosis not present

## 2024-06-01 DIAGNOSIS — D638 Anemia in other chronic diseases classified elsewhere: Secondary | ICD-10-CM | POA: Diagnosis not present

## 2024-06-01 DIAGNOSIS — K219 Gastro-esophageal reflux disease without esophagitis: Secondary | ICD-10-CM | POA: Diagnosis not present

## 2024-06-01 DIAGNOSIS — E785 Hyperlipidemia, unspecified: Secondary | ICD-10-CM | POA: Diagnosis not present

## 2024-06-01 DIAGNOSIS — G4700 Insomnia, unspecified: Secondary | ICD-10-CM | POA: Insufficient documentation

## 2024-06-01 MED ORDER — TRAZODONE HCL 50 MG PO TABS: 25.0000 mg | ORAL_TABLET | Freq: Every evening | ORAL | 3 refills | Status: DC | PRN

## 2024-06-01 NOTE — Assessment & Plan Note (Signed)
taking Atorvastatin, update lipid panel.

## 2024-06-01 NOTE — Assessment & Plan Note (Signed)
 Stable,  taking Famotidine , Omeprazole , Hgb 10.9 02/29/24

## 2024-06-01 NOTE — Progress Notes (Unsigned)
 Location:   [facility]   Place of Service:    Provider: [facility] Nobuko Gsell NP  Mariane Burpee X, NP  Patient Care Team: Blia Totman X, NP as PCP - General (Internal Medicine)  Extended Emergency Contact Information Primary Emergency Contact: [facility] Address: [address]          [place], [place] [address] [place] Mobile Phone: [phone] Relation: Daughter Secondary Emergency Contact: Crotts,Sally Mobile Phone: [phone] Relation: Daughter  Code Status:  DNR Goals of care: Advanced Directive information    10/22/2023    8:31 AM  Advanced Directives  Does Patient Have a Medical Advance Directive? Yes  Type of Advance Directive Out of facility DNR (pink MOST or yellow form)  Does patient want to make changes to medical advance directive? No - Patient declined  Pre-existing out of facility DNR order (yellow form or pink MOST form) Pink MOST form placed in chart (order not valid for inpatient use)     No chief complaint on file.   HPI:  Pt is a 89 y.o. female seen today for medical management of chronic diseases.      R arm pain, saw Dr. Ladona 07/22/21, started 2022 Isosorbide  for chest and left arm pain. It was deemed to be musculoskeletal  pain in nature.             Anemia, taking folic acid , Iron 48, Hgb 10.9 02/29/24             CVA/TIA, craniotomy 2007 for subdural hematoma, followed by cardiology, neurology, on Plavix ,              Left bundle branch block, saw Cardiology, had loop recorder             PMR, on Methotrexate, Prednisone .              OP on Prolia  due 06/2024 MD, followed by Ophthalmology             CKD Bun/creat 18/1.2 02/29/24             GERD, taking Famotidine , Omeprazole , Hgb 10.9 02/29/24             HLD, taking Atorvastatin              HTN, on Amlodipine , Hydralazine , Metoprolol , Losartan              Hypothyroidism, on Levothyroxine , TSH 2.813 07/23/22  Constipation, followed by GI, taking dietary fiber.  Past Medical History:   Diagnosis Date   Arthritis    Carpal tunnel syndrome    Gait abnormality    has for 38yr-no use of cane   Hx of TIA (transient ischemic attack) and stroke    Hyperlipidemia    Hypertension    Hypothyroidism    Osteoporosis    Wears glasses    Wears hearing aid    Past Surgical History:  Procedure Laterality Date   APPENDECTOMY     CARPAL TUNNEL RELEASE Left 07/11/2013   Procedure: CARPAL TUNNEL RELEASE;  Surgeon: Lamar LULLA Leonor Mickey., MD;  Location: [facility];  Service: Orthopedics;  Laterality: Left;   COLONOSCOPY     LOOP RECORDER INSERTION N/A 07/24/2022   Procedure: LOOP RECORDER INSERTION;  Surgeon: Cindie Ole DASEN, MD;  Location: [facility];  Service: Cardiovascular;  Laterality: N/A;   SUBDURAL HEMATOMA EVACUATION VIA CRANIOTOMY  2007   TONSILLECTOMY      Allergies  Allergen Reactions   Pollen Extract  Levofloxacin Other (See Comments)    Insomnia    Allergies as of 06/01/2024       Reactions   Pollen Extract    Levofloxacin Other (See Comments)   Insomnia        Medication List        Accurate as of June 01, 2024 11:59 PM. If you have any questions, ask your nurse or doctor.          STOP taking these medications    diclofenac  Sodium 1 % Gel Commonly known as: VOLTAREN  Stopped by: Darrin Koman X Ayline Dingus       TAKE these medications    acetaminophen  500 MG tablet Commonly known as: TYLENOL  Take 500 mg by mouth every 6 (six) hours as needed.   amLODipine  5 MG tablet Commonly known as: NORVASC  Take 1 tablet (5 mg total) by mouth daily.   atorvastatin  10 MG tablet Commonly known as: LIPITOR Take 1 tablet (10 mg total) by mouth daily.   CITRUCEL PO Take by mouth as needed.   clopidogrel  75 MG tablet Commonly known as: PLAVIX  Take 1 tablet (75 mg total) by mouth daily.   cyanocobalamin 1000 MCG tablet Take 1,000 mcg by mouth daily.   denosumab  60 MG/ML Sosy injection Commonly known as: PROLIA  Inject 60 mg into the  skin every 6 (six) months.   diphenhydrAMINE  25 MG tablet Commonly known as: BENADRYL  Take 12.5 mg by mouth at bedtime as needed for sleep or allergies.   famotidine  40 MG tablet Commonly known as: PEPCID  Take 1 tablet (40 mg total) by mouth daily.   folic acid  1 MG tablet Commonly known as: FOLVITE  Take 1 mg by mouth daily.   hydrALAZINE  25 MG tablet Commonly known as: APRESOLINE  Take 1 tablet (25 mg total) by mouth 3 (three) times daily.   isosorbide  mononitrate 60 MG 24 hr tablet Commonly known as: IMDUR  Take 1 tablet (60 mg total) by mouth daily.   levothyroxine  112 MCG tablet Commonly known as: SYNTHROID  Take 1 tablet (112 mcg total) by mouth every morning.   losartan  100 MG tablet Commonly known as: COZAAR  Take 1 tablet (100 mg total) by mouth daily.   methotrexate 2.5 MG tablet Commonly known as: RHEUMATREX Take 7 tablets by mouth once a week.   metoprolol  succinate 50 MG 24 hr tablet Commonly known as: TOPROL -XL Take 1 tablet (50 mg total) by mouth daily. Take with or immediately following a meal.   omeprazole  40 MG capsule Commonly known as: PRILOSEC Take 1 capsule (40 mg total) by mouth daily.   predniSONE  1 MG tablet Commonly known as: DELTASONE  Take 3 mg by mouth daily.   PRESERVISION AREDS PO Take 1 tablet by mouth 2 (two) times daily.   traZODone  50 MG tablet Commonly known as: DESYREL  Take 0.5 tablets (25 mg total) by mouth at bedtime as needed for sleep. Started by: Jazzelle Zhang X Jo Booze   VITAMIN D -3 PO Take 800 Units by mouth as directed.        Review of Systems  Constitutional:  Negative for appetite change, fatigue and fever.  HENT:  Positive for hearing loss. Negative for congestion and trouble swallowing.   Eyes:  Negative for visual disturbance.       MD  Respiratory:  Negative for chest tightness, shortness of breath and wheezing.   Cardiovascular:  Negative for chest pain, palpitations and leg swelling.  Gastrointestinal:  Negative  for abdominal pain, constipation, nausea and vomiting.  Genitourinary:  Positive for frequency.  Negative for dysuria and urgency.       Incontinent.   Musculoskeletal:  Positive for arthralgias, back pain and gait problem.       R+L arm pain/discomfort, positional. R+L knee pain  Skin:  Negative for color change.  Neurological:  Negative for speech difficulty, weakness and headaches.       Left great toe burning/pain some times.   Psychiatric/Behavioral:  Positive for sleep disturbance. Negative for behavioral problems. The patient is not nervous/anxious.        Occasional early am awake.     Immunization History  Administered Date(s) Administered    sv, Bivalent, Protein Subunit Rsvpref,pf [place]) 08/07/2022   Fluad  Trivalent(High Dose 65+) 07/27/2023   Influenza, High Dose Seasonal PF 07/25/2015, 08/17/2018, 06/28/2019   Influenza-Unspecified 08/07/2014, 07/27/2016, 07/23/2017, 07/25/2022   Moderna Covid-19 Fall Seasonal Vaccine 49yrs & older 07/27/2022   Moderna Covid-19 Vaccine Bivalent Booster 44yrs & up 03/13/2022   Moderna SARS-COV2 Booster Vaccination 11/27/2019, 08/21/2020   Moderna Sars-Covid-2 Vaccination 10/30/2019   PFIZER(Purple Top)SARS-COV-2 Vaccination 02/18/2021   PPD Test 11/23/2018   Pfizer Covid-19 Vaccine Bivalent Booster 40yrs & up 07/07/2021   Pfizer(Comirnaty )Fall Seasonal Vaccine 12 years and older 07/16/2023   Pneumococcal Conjugate-13 09/07/2014   Pneumococcal Polysaccharide-23 09/07/2005, 09/07/2010   Tdap 11/29/2015   Zoster Recombinant(Shingrix) 06/09/2018, 09/02/2018   Zoster, Live 01/20/2005   Pertinent  Health Maintenance Due  Topic Date Due   INFLUENZA VACCINE  05/26/2024   DEXA SCAN  Completed      07/24/2022    1:00 PM 05/27/2023    2:17 PM 07/21/2023    8:31 AM 10/22/2023    8:30 AM 06/01/2024    4:01 PM  Fall Risk  Falls in the past year?  0 0 0 0  Was there an injury with Fall?  0 0 0 0  Fall Risk Category Calculator  0 0 0 0   (RETIRED) Patient Fall Risk Level Moderate fall risk       Patient at Risk for Falls Due to  No Fall Risks No Fall Risks History of fall(s) No Fall Risks  Fall risk Follow up  Falls evaluation completed Falls evaluation completed Falls evaluation completed Falls evaluation completed     Data saved with a previous flowsheet row definition   Functional Status Survey:    Vitals:   06/01/24 1601  BP: 122/68  Pulse: 75  Resp: 12  Temp: 97.6 F (36.4 C)  SpO2: 96%  Height: 5' 2 (1.575 m)   Body mass index is 25.06 kg/m. Physical Exam Vitals and nursing note reviewed.  Constitutional:      Appearance: Normal appearance.  HENT:     Head: Normocephalic and atraumatic.     Nose: Nose normal.     Mouth/Throat:     Mouth: Mucous membranes are moist.  Eyes:     Extraocular Movements: Extraocular movements intact.     Conjunctiva/sclera: Conjunctivae normal.     Pupils: Pupils are equal, round, and reactive to light.  Cardiovascular:     Rate and Rhythm: Normal rate and regular rhythm.     Heart sounds: Murmur heard.     Comments: Left sternal border soft SM Pulmonary:     Effort: Pulmonary effort is normal.     Breath sounds: No rales.  Abdominal:     Palpations: Abdomen is soft.     Tenderness: There is no abdominal tenderness.  Musculoskeletal:     Cervical back: Normal range of motion and neck  supple.     Right lower leg: No edema.     Left lower leg: No edema.  Skin:    General: Skin is warm and dry.  Neurological:     General: No focal deficit present.     Mental Status: She is alert and oriented to person, place, and time. Mental status is at baseline.     Gait: Gait abnormal.  Psychiatric:        Mood and Affect: Mood normal.        Behavior: Behavior normal.        Thought Content: Thought content normal.        Judgment: Judgment normal.     Labs reviewed: Recent Labs    10/17/23 1542 12/16/23 0705 12/21/23 0724 02/29/24 0000  NA 135 138 139 136*   K 3.8 4.2 3.9 4.4  CL 107 103 105 101  CO2 21* 27 27 24*  GLUCOSE 111* 97 90  --   BUN 26* 25 20 18   CREATININE 0.93 0.97* 0.95 1.2*  CALCIUM  7.9* 9.3 9.1  --    Recent Labs    10/17/23 1542 12/21/23 0724 02/29/24 0000  AST 26 15 21   ALT 27 17 24   ALKPHOS 54  --  76  BILITOT 0.3 0.6  --   PROT 6.5 5.9*  --   ALBUMIN 3.8  --  4.1   Recent Labs    10/17/23 1542 12/21/23 0724 02/29/24 0000  WBC 5.5 6.0 7.5  NEUTROABS 4.1 3,720 4.80  HGB 12.7 10.5* 10.9*  HCT 39.2 33.8* 34*  MCV 95.8 99.4  --   PLT 259 253 299   Lab Results  Component Value Date   TSH 2.813 07/23/2022   Lab Results  Component Value Date   HGBA1C 5.9 (H) 07/24/2022   Lab Results  Component Value Date   CHOL 116 07/24/2022   HDL 39 (L) 07/24/2022   LDLCALC 63 07/24/2022   LDLDIRECT 78.0 06/10/2016   TRIG 70 07/24/2022   CHOLHDL 3.0 07/24/2022    Significant Diagnostic Results in last 30 days:  CUP PACEART REMOTE DEVICE CHECK Result Date: 05/04/2024 ILR summary report received. Battery status OK. Normal device function. No new symptom, tachy, brady, or pause episodes. No new AF episodes. Monthly summary reports and ROV/PRN - CS, CVRS   Assessment/Plan  Osteoporosis, post-menopausal  on Prolia  Update Vit D, Mg, P  CBC/diff, CMP/eGFR done 06/01/24 per Rheumatology request.   GERD (gastroesophageal reflux disease) Stable,  taking Famotidine , Omeprazole , Hgb 10.9 02/29/24  HLD (hyperlipidemia) taking Atorvastatin , update lipid panel.   Essential hypertension Blood pressure is controlled, on Amlodipine , Hydralazine , Metoprolol , Losartan   Acquired hypothyroidism on Levothyroxine , TSH 2.813 07/23/22, update TSH  Anemia, chronic disease  taking folic acid , Iron 48, Hgb 10.9 02/29/24  Cryptogenic stroke (HCC) craniotomy 2007 for subdural hematoma, followed by cardiology, neurology, on Plavix ,   Seronegative rheumatoid arthritis (HCC) on Methotrexate, Prednisone .   Insomnia Try Trazodone      Family/ staff Communication: plan of care reviewed with the patient   Labs/tests ordered: TSH, lipids, Mg, P, Vit D

## 2024-06-01 NOTE — Assessment & Plan Note (Signed)
 taking folic acid , Iron 48, Hgb 10.9 02/29/24

## 2024-06-01 NOTE — Assessment & Plan Note (Signed)
on Methotrexate, Prednisone.

## 2024-06-01 NOTE — Assessment & Plan Note (Signed)
 craniotomy 2007 for subdural hematoma, followed by cardiology, neurology, on Plavix ,

## 2024-06-01 NOTE — Assessment & Plan Note (Addendum)
 on Prolia  Update Vit D, Mg, P  CBC/diff, CMP/eGFR done 06/01/24 per Rheumatology request.

## 2024-06-01 NOTE — Assessment & Plan Note (Signed)
 Blood pressure is controlled, on Amlodipine , Hydralazine , Metoprolol , Losartan 

## 2024-06-01 NOTE — Patient Instructions (Addendum)
 F/u 6 months. Lab Tuesday August 12th @7am 

## 2024-06-01 NOTE — Assessment & Plan Note (Signed)
Try Trazodone

## 2024-06-01 NOTE — Assessment & Plan Note (Signed)
 on Levothyroxine , TSH 2.813 07/23/22, update TSH

## 2024-06-02 ENCOUNTER — Encounter: Payer: Self-pay | Admitting: Nurse Practitioner

## 2024-06-05 ENCOUNTER — Ambulatory Visit

## 2024-06-05 DIAGNOSIS — I639 Cerebral infarction, unspecified: Secondary | ICD-10-CM | POA: Diagnosis not present

## 2024-06-06 ENCOUNTER — Ambulatory Visit: Payer: Self-pay | Admitting: Cardiology

## 2024-06-06 DIAGNOSIS — E559 Vitamin D deficiency, unspecified: Secondary | ICD-10-CM | POA: Diagnosis not present

## 2024-06-06 DIAGNOSIS — M810 Age-related osteoporosis without current pathological fracture: Secondary | ICD-10-CM | POA: Diagnosis not present

## 2024-06-06 LAB — CUP PACEART REMOTE DEVICE CHECK
Date Time Interrogation Session: 20250811033300
Implantable Pulse Generator Implant Date: 20230929
Pulse Gen Serial Number: 192101

## 2024-06-10 LAB — MAGNESIUM: Magnesium: 2.5 mg/dL (ref 1.5–2.5)

## 2024-06-10 LAB — VITAMIN D 1,25 DIHYDROXY
Vitamin D 1, 25 (OH)2 Total: 45 pg/mL (ref 18–72)
Vitamin D2 1, 25 (OH)2: <8 pg/mL
Vitamin D3 1, 25 (OH)2: 45 pg/mL

## 2024-06-10 LAB — LIPID PANEL
Cholesterol: 128 mg/dL (ref <200)
HDL: 55 mg/dL (ref >=50)
LDL Cholesterol (Calc): 55 mg/dL
Non-HDL Cholesterol (Calc): 73 mg/dL (ref <130)
Total CHOL/HDL Ratio: 2.3 (calc) (ref <5.0)
Triglycerides: 92 mg/dL (ref <150)

## 2024-06-10 LAB — TSH: TSH: 0.42 m[IU]/L (ref 0.40–4.50)

## 2024-06-10 LAB — PHOSPHORUS: Phosphorus: 3.9 mg/dL (ref 2.1–4.3)

## 2024-06-12 ENCOUNTER — Ambulatory Visit: Payer: Medicare Other

## 2024-06-15 ENCOUNTER — Encounter: Admitting: Nurse Practitioner

## 2024-06-20 ENCOUNTER — Ambulatory Visit (INDEPENDENT_AMBULATORY_CARE_PROVIDER_SITE_OTHER): Admitting: Adult Health

## 2024-06-20 ENCOUNTER — Encounter: Payer: Self-pay | Admitting: Adult Health

## 2024-06-20 VITALS — BP 122/74 | HR 76 | Temp 98.1°F | Ht 62.0 in

## 2024-06-20 DIAGNOSIS — K219 Gastro-esophageal reflux disease without esophagitis: Secondary | ICD-10-CM | POA: Diagnosis not present

## 2024-06-20 DIAGNOSIS — I10 Essential (primary) hypertension: Secondary | ICD-10-CM

## 2024-06-20 DIAGNOSIS — R0981 Nasal congestion: Secondary | ICD-10-CM

## 2024-06-20 DIAGNOSIS — R059 Cough, unspecified: Secondary | ICD-10-CM

## 2024-06-20 DIAGNOSIS — J069 Acute upper respiratory infection, unspecified: Secondary | ICD-10-CM | POA: Diagnosis not present

## 2024-06-20 DIAGNOSIS — Z8673 Personal history of transient ischemic attack (TIA), and cerebral infarction without residual deficits: Secondary | ICD-10-CM

## 2024-06-20 DIAGNOSIS — R051 Acute cough: Secondary | ICD-10-CM

## 2024-06-20 LAB — POC COVID19 BINAXNOW: SARS Coronavirus 2 Ag: NEGATIVE

## 2024-06-20 MED ORDER — GUAIFENESIN ER 600 MG PO TB12: 600.0000 mg | ORAL_TABLET | Freq: Two times a day (BID) | ORAL | 0 refills | Status: AC

## 2024-06-20 MED ORDER — DOXYCYCLINE HYCLATE 100 MG PO TABS: 100.0000 mg | ORAL_TABLET | Freq: Two times a day (BID) | ORAL | 0 refills | Status: AC

## 2024-06-20 NOTE — Progress Notes (Signed)
 [facility] clinic  Provider:  Jereld Serum DNP  Code Status:  DNR  Goals of Care:     10/22/2023    8:31 AM  Advanced Directives  Does Patient Have a Medical Advance Directive? Yes  Type of Advance Directive Out of facility DNR (pink MOST or yellow form)  Does patient want to make changes to medical advance directive? No - Patient declined  Pre-existing out of facility DNR order (yellow form or pink MOST form) Pink MOST form placed in chart (order not valid for inpatient use)     Chief Complaint  Patient presents with   Acute Visit    Coughing and congestion  It started on Thursday and is still going on. Patient has been taking mucinex  since yesterday. 99.8 temperature when pt last checked. Runny nose has gotten worse   Discussed the use of AI scribe software for clinical note transcription with the patient, who gave verbal consent to proceed.  HPI: Patient is a 89 y.o. female seen today for an acute visit for cough and congestion. She was accompanied by her daughter.  She has been experiencing a productive cough with thick, colorless phlegm and congestion for six days. She started taking Mucinex  yesterday, which she feels might have been more effective if started earlier. She experiences some shortness of breath, which she describes as 'very little'. No fever or sore throat is present.  Her appetite has been poor since the onset of her symptoms, similar to an episode she experienced in June. She tends to 'pick at food' due to lack of appetite, although she usually has a decent appetite between episodes.  She has been experiencing increased frequency of bathroom visits to expectorate phlegm, approximately 20 times a day and night combined. She reports difficulty sleeping due to the cough and frequent bathroom trips.  Her medical history includes hypertension, currently managed with amlodipine  5 mg daily, hydralazine  25 mg twice a day, indapamide 60 mg daily, metoprolol   succinate 50 mg daily, losartan  100 mg daily, and Plavix  75 mg daily. She also has a history of stroke in 2023, for which she has a loop recorder to monitor for any irregularities, though none have been found.  She is on famotidine  40 mg daily and Prilosec 40 mg daily for acid reflux.    Past Medical History:  Diagnosis Date   Arthritis    Carpal tunnel syndrome    Gait abnormality    has for 6yr-no use of cane   Hx of TIA (transient ischemic attack) and stroke    Hyperlipidemia    Hypertension    Hypothyroidism    Osteoporosis    Wears glasses    Wears hearing aid     Past Surgical History:  Procedure Laterality Date   APPENDECTOMY     CARPAL TUNNEL RELEASE Left 07/11/2013   Procedure: CARPAL TUNNEL RELEASE;  Surgeon: Lamar LULLA Leonor Mickey., MD;  Location: [facility];  Service: Orthopedics;  Laterality: Left;   COLONOSCOPY     LOOP RECORDER INSERTION N/A 07/24/2022   Procedure: LOOP RECORDER INSERTION;  Surgeon: Cindie Ole DASEN, MD;  Location: [facility];  Service: Cardiovascular;  Laterality: N/A;   SUBDURAL HEMATOMA EVACUATION VIA CRANIOTOMY  2007   TONSILLECTOMY      Allergies  Allergen Reactions   Pollen Extract    Levofloxacin Other (See Comments)    Insomnia    Outpatient Encounter Medications as of 06/20/2024  Medication Sig   acetaminophen  (TYLENOL ) 500 MG  tablet Take 500 mg by mouth every 6 (six) hours as needed.   amLODipine  (NORVASC ) 5 MG tablet Take 1 tablet (5 mg total) by mouth daily.   atorvastatin  (LIPITOR) 10 MG tablet Take 1 tablet (10 mg total) by mouth daily.   Cholecalciferol  (VITAMIN D -3 PO) Take 800 Units by mouth as directed.   clopidogrel  (PLAVIX ) 75 MG tablet Take 1 tablet (75 mg total) by mouth daily.   cyanocobalamin 1000 MCG tablet Take 1,000 mcg by mouth daily.   denosumab  (PROLIA ) 60 MG/ML SOSY injection Inject 60 mg into the skin every 6 (six) months.   diphenhydrAMINE  (BENADRYL ) 25 MG tablet Take 12.5 mg by mouth  at bedtime as needed for sleep or allergies.   doxycycline  (VIBRA -TABS) 100 MG tablet Take 1 tablet (100 mg total) by mouth 2 (two) times daily for 7 days.   famotidine  (PEPCID ) 40 MG tablet Take 1 tablet (40 mg total) by mouth daily.   folic acid  (FOLVITE ) 1 MG tablet Take 1 mg by mouth daily.   guaiFENesin  (MUCINEX ) 600 MG 12 hr tablet Take 1 tablet (600 mg total) by mouth 2 (two) times daily for 14 days.   hydrALAZINE  (APRESOLINE ) 25 MG tablet Take 1 tablet (25 mg total) by mouth 3 (three) times daily.   isosorbide  mononitrate (IMDUR ) 60 MG 24 hr tablet Take 1 tablet (60 mg total) by mouth daily.   levothyroxine  (SYNTHROID ) 112 MCG tablet Take 1 tablet (112 mcg total) by mouth every morning.   losartan  (COZAAR ) 100 MG tablet Take 1 tablet (100 mg total) by mouth daily.   methotrexate (RHEUMATREX) 2.5 MG tablet Take 7 tablets by mouth once a week.   Methylcellulose, Laxative, (CITRUCEL PO) Take by mouth as needed.   metoprolol  succinate (TOPROL -XL) 50 MG 24 hr tablet Take 1 tablet (50 mg total) by mouth daily. Take with or immediately following a meal.   Multiple Vitamins-Minerals (PRESERVISION AREDS PO) Take 1 tablet by mouth 2 (two) times daily.   omeprazole  (PRILOSEC) 40 MG capsule Take 1 capsule (40 mg total) by mouth daily.   predniSONE  (DELTASONE ) 1 MG tablet Take 3 mg by mouth daily.   traZODone  (DESYREL ) 50 MG tablet Take 0.5 tablets (25 mg total) by mouth at bedtime as needed for sleep. (Patient not taking: Reported on 06/20/2024)   Facility-Administered Encounter Medications as of 06/20/2024  Medication   denosumab  (PROLIA ) injection 60 mg    Review of Systems:  Review of Systems  Constitutional:  Positive for appetite change. Negative for chills, fatigue and fever.  HENT:  Negative for congestion, hearing loss, rhinorrhea and sore throat.   Eyes: Negative.   Respiratory:  Positive for cough and shortness of breath. Negative for wheezing.   Cardiovascular:  Negative for chest  pain, palpitations and leg swelling.  Gastrointestinal:  Negative for abdominal pain, constipation, diarrhea, nausea and vomiting.  Genitourinary:  Negative for dysuria.  Musculoskeletal:  Negative for arthralgias, back pain and myalgias.  Skin:  Negative for color change, rash and wound.  Neurological:  Negative for dizziness, weakness and headaches.  Psychiatric/Behavioral:  Negative for behavioral problems. The patient is not nervous/anxious.     Health Maintenance  Topic Date Due   COVID-19 Vaccine (7 - Mixed Product risk 2024-25 season) 01/13/2024   Medicare Annual Wellness (AWV)  03/17/2024   INFLUENZA VACCINE  05/26/2024   DTaP/Tdap/Td (2 - Td or Tdap) 11/28/2025   Pneumococcal Vaccine: 50+ Years  Completed   DEXA SCAN  Completed   Zoster Vaccines-  Shingrix  Completed   HPV VACCINES  Aged Out   Meningococcal B Vaccine  Aged Out    Physical Exam: Vitals:   06/20/24 1301  BP: 122/74  Pulse: 76  Temp: 98.1 F (36.7 C)  SpO2: 99%  Height: 5' 2 (1.575 m)   Body mass index is 25.06 kg/m. Physical Exam Constitutional:      Appearance: Normal appearance.  HENT:     Head: Normocephalic and atraumatic.     Nose: Nose normal.     Mouth/Throat:     Mouth: Mucous membranes are moist.  Eyes:     Conjunctiva/sclera: Conjunctivae normal.  Cardiovascular:     Rate and Rhythm: Normal rate and regular rhythm.  Pulmonary:     Effort: Pulmonary effort is normal.     Breath sounds: Normal breath sounds.  Abdominal:     General: Bowel sounds are normal.     Palpations: Abdomen is soft.  Musculoskeletal:        General: Normal range of motion.     Cervical back: Normal range of motion.  Skin:    General: Skin is warm and dry.  Neurological:     Mental Status: She is alert.  Psychiatric:        Mood and Affect: Mood normal.        Behavior: Behavior normal.        Thought Content: Thought content normal.        Judgment: Judgment normal.     Labs reviewed: Basic  Metabolic Panel: Recent Labs    10/17/23 1542 12/16/23 0705 12/21/23 0724 02/29/24 0000 06/06/24 0705  NA 135 138 139 136*  --   K 3.8 4.2 3.9 4.4  --   CL 107 103 105 101  --   CO2 21* 27 27 24*  --   GLUCOSE 111* 97 90  --   --   BUN 26* 25 20 18   --   CREATININE 0.93 0.97* 0.95 1.2*  --   CALCIUM  7.9* 9.3 9.1  --   --   MG  --   --   --   --  2.5  PHOS  --   --   --   --  3.9  TSH  --   --   --   --  0.42   Liver Function Tests: Recent Labs    10/17/23 1542 12/21/23 0724 02/29/24 0000  AST 26 15 21   ALT 27 17 24   ALKPHOS 54  --  76  BILITOT 0.3 0.6  --   PROT 6.5 5.9*  --   ALBUMIN 3.8  --  4.1   Recent Labs    10/17/23 1542  LIPASE 36   No results for input(s): AMMONIA in the last 8760 hours. CBC: Recent Labs    10/17/23 1542 12/21/23 0724 02/29/24 0000  WBC 5.5 6.0 7.5  NEUTROABS 4.1 3,720 4.80  HGB 12.7 10.5* 10.9*  HCT 39.2 33.8* 34*  MCV 95.8 99.4  --   PLT 259 253 299   Lipid Panel: Recent Labs    06/06/24 0705  CHOL 128  HDL 55  LDLCALC 55  TRIG 92  CHOLHDL 2.3   Lab Results  Component Value Date   HGBA1C 5.9 (H) 07/24/2022    Procedures since last visit: CUP PACEART REMOTE DEVICE CHECK Result Date: 06/06/2024 ILR summary report received. Battery status OK. Normal device function. No new symptom, tachy, brady, or pause episodes. No new AF episodes. Monthly summary  reports and ROV/PRN LA, CVRS   Assessment/Plan  1. Upper respiratory tract infection, unspecified type (Primary) Acute cough Chronic nasal congestion -  with cough and congestion. COVID-19 test negative. Minimal shortness of breath.  - Prescribe doxycycline  100 mg twice daily for 7 days. - Continue Mucinex  (guaifenesin ) for up to 2 weeks. - Encourage increased fluid intake. - Advise rest at night and mobilization during the day. - doxycycline  (VIBRA -TABS) 100 MG tablet; Take 1 tablet (100 mg total) by mouth 2 (two) times daily for 7 days.  Dispense: 14 tablet;  Refill: 0 - guaiFENesin  (MUCINEX ) 600 MG 12 hr tablet; Take 1 tablet (600 mg total) by mouth 2 (two) times daily for 14 days.  Dispense: 28 tablet; Refill: 0  2. Essential hypertension -  Blood pressure well-controlled at 122/74 mmHg. Current medications include amlodipine , hydralazine , metoprolol  succinate, and losartan .   3. Gastroesophageal reflux disease without esophagitis -  Managed with famotidine  and omeprazole . No current exacerbation of symptoms.     Labs/tests ordered:   POC COVID-19    Return if symptoms worsen or fail to improve.  Eitan Doubleday Medina-Vargas, NP

## 2024-06-22 ENCOUNTER — Encounter: Admitting: Nurse Practitioner

## 2024-06-29 ENCOUNTER — Encounter: Admitting: Nurse Practitioner

## 2024-07-06 ENCOUNTER — Non-Acute Institutional Stay: Admitting: Nurse Practitioner

## 2024-07-06 ENCOUNTER — Ambulatory Visit (INDEPENDENT_AMBULATORY_CARE_PROVIDER_SITE_OTHER)

## 2024-07-06 ENCOUNTER — Other Ambulatory Visit (HOSPITAL_BASED_OUTPATIENT_CLINIC_OR_DEPARTMENT_OTHER): Payer: Self-pay

## 2024-07-06 ENCOUNTER — Encounter: Payer: Self-pay | Admitting: Nurse Practitioner

## 2024-07-06 VITALS — BP 118/62 | HR 80 | Temp 97.5°F | Resp 18 | Ht 62.0 in | Wt 136.0 lb

## 2024-07-06 DIAGNOSIS — I639 Cerebral infarction, unspecified: Secondary | ICD-10-CM | POA: Diagnosis not present

## 2024-07-06 DIAGNOSIS — Z0000 Encounter for general adult medical examination without abnormal findings: Secondary | ICD-10-CM

## 2024-07-06 LAB — CUP PACEART REMOTE DEVICE CHECK
Date Time Interrogation Session: 20250910033800
Implantable Pulse Generator Implant Date: 20230929
Pulse Gen Serial Number: 192101

## 2024-07-06 MED ORDER — COVID-19 MRNA VACC (MODERNA) 50 MCG/0.5ML IM SUSY
0.5000 mL | PREFILLED_SYRINGE | Freq: Once | INTRAMUSCULAR | 0 refills | Status: AC
  Filled 2024-07-06: qty 0.5, 1d supply, fill #0

## 2024-07-06 NOTE — Progress Notes (Unsigned)
 Subjective:   [name] is a 89 y.o. female who presents for Medicare Annual (Subsequent) preventive examination at clinic [facility]  Visit Complete: In person  Patient Medicare AWV questionnaire was completed by the patient on 07/06/24; I have confirmed that all information answered by patient is correct and no changes since this date.  Cardiac Risk Factors include: advanced age (>19men, >40 women);dyslipidemia;hypertension     Objective:    Today's Vitals   07/06/24 1538 07/06/24 1546  BP: 118/62   Pulse: 80   Resp: 18   Temp: (!) 97.5 F (36.4 C)   SpO2: 98%   Weight: 136 lb (61.7 kg)   Height: 5' 2 (1.575 m)   PainSc: 0-No pain 2    Body mass index is 24.87 kg/m.     07/06/2024    3:32 PM 10/22/2023    8:31 AM 10/17/2023    3:32 PM 09/13/2023    3:14 PM 07/21/2023    8:31 AM 05/27/2023   10:06 AM 02/09/2023    1:50 PM  Advanced Directives  Does Patient Have a Medical Advance Directive? Yes Yes Yes Yes Yes Yes Yes  Type of Advance Directive Out of facility DNR (pink MOST or yellow form) Out of facility DNR (pink MOST or yellow form) Living will;Healthcare Power of [employer] of [place];Living will Out of facility DNR (pink MOST or yellow form);Healthcare Power of [place];Living will Out of facility DNR (pink MOST or yellow form);Healthcare Power of [place];Living will Living will;Healthcare Power of Attorney  Does patient want to make changes to medical advance directive? No - Patient declined No - Patient declined  No - Patient declined No - Patient declined No - Patient declined No - Patient declined  Copy of Healthcare Power of Attorney in Chart?    No - copy requested No - copy requested No - copy requested   Would patient like information on creating a medical advance directive?       No - Patient declined  Pre-existing out of facility DNR order (yellow form or pink MOST form) Yellow form placed in chart (order not valid for inpatient use) Pink MOST  form placed in chart (order not valid for inpatient use)   Yellow form placed in chart (order not valid for inpatient use);Pink MOST form placed in chart (order not valid for inpatient use) Yellow form placed in chart (order not valid for inpatient use);Pink MOST form placed in chart (order not valid for inpatient use)     Current Medications (verified) Outpatient Encounter Medications as of 07/06/2024  Medication Sig   acetaminophen  (TYLENOL ) 500 MG tablet Take 500 mg by mouth every 6 (six) hours as needed.   amLODipine  (NORVASC ) 5 MG tablet Take 1 tablet (5 mg total) by mouth daily.   atorvastatin  (LIPITOR) 10 MG tablet Take 1 tablet (10 mg total) by mouth daily.   Cholecalciferol  (VITAMIN D -3 PO) Take 800 Units by mouth as directed.   clopidogrel  (PLAVIX ) 75 MG tablet Take 1 tablet (75 mg total) by mouth daily.   COVID-19 mRNA vaccine (SPIKEVAX) syringe Inject 0.5 mLs into the muscle once for 1 dose.   cyanocobalamin 1000 MCG tablet Take 1,000 mcg by mouth daily.   denosumab  (PROLIA ) 60 MG/ML SOSY injection Inject 60 mg into the skin every 6 (six) months.   diphenhydrAMINE  (BENADRYL ) 25 MG tablet Take 12.5 mg by mouth at bedtime as needed for sleep or allergies.   famotidine  (PEPCID ) 40 MG tablet Take 1 tablet (40  mg total) by mouth daily.   folic acid  (FOLVITE ) 1 MG tablet Take 1 mg by mouth daily.   isosorbide  mononitrate (IMDUR ) 60 MG 24 hr tablet Take 1 tablet (60 mg total) by mouth daily.   levothyroxine  (SYNTHROID ) 112 MCG tablet Take 1 tablet (112 mcg total) by mouth every morning.   losartan  (COZAAR ) 100 MG tablet Take 1 tablet (100 mg total) by mouth daily.   methotrexate (RHEUMATREX) 2.5 MG tablet Take 7 tablets by mouth once a week.   Methylcellulose, Laxative, (CITRUCEL PO) Take by mouth as needed.   metoprolol  succinate (TOPROL -XL) 50 MG 24 hr tablet Take 1 tablet (50 mg total) by mouth daily. Take with or immediately following a meal.   omeprazole  (PRILOSEC) 40 MG capsule Take  1 capsule (40 mg total) by mouth daily.   predniSONE  (DELTASONE ) 1 MG tablet Take 3 mg by mouth daily.   [DISCONTINUED] hydrALAZINE  (APRESOLINE ) 25 MG tablet Take 1 tablet (25 mg total) by mouth 3 (three) times daily. (Patient taking differently: Take 25 mg by mouth in the morning and at bedtime.)   [DISCONTINUED] Multiple Vitamins-Minerals (PRESERVISION AREDS PO) Take 1 tablet by mouth 2 (two) times daily. (Patient not taking: Reported on 07/06/2024)   [DISCONTINUED] traZODone  (DESYREL ) 50 MG tablet Take 0.5 tablets (25 mg total) by mouth at bedtime as needed for sleep. (Patient not taking: Reported on 06/20/2024)   Facility-Administered Encounter Medications as of 07/06/2024  Medication   denosumab  (PROLIA ) injection 60 mg    Allergies (verified) Pollen extract and Levofloxacin   History: Past Medical History:  Diagnosis Date   Arthritis    Carpal tunnel syndrome    Gait abnormality    has for 36yr-no use of cane   Hx of TIA (transient ischemic attack) and stroke    Hyperlipidemia    Hypertension    Hypothyroidism    Osteoporosis    Wears glasses    Wears hearing aid    Past Surgical History:  Procedure Laterality Date   APPENDECTOMY     CARPAL TUNNEL RELEASE Left 07/11/2013   Procedure: CARPAL TUNNEL RELEASE;  Surgeon: Lamar LULLA Leonor Mickey., MD;  Location: [facility];  Service: Orthopedics;  Laterality: Left;   COLONOSCOPY     LOOP RECORDER INSERTION N/A 07/24/2022   Procedure: LOOP RECORDER INSERTION;  Surgeon: Cindie Ole DASEN, MD;  Location: [facility];  Service: Cardiovascular;  Laterality: N/A;   SUBDURAL HEMATOMA EVACUATION VIA CRANIOTOMY  2007   TONSILLECTOMY     Family History  Problem Relation Age of Onset   Cancer Mother 67       breast ca   Heart attack Father    Other Father        Heavy smoker   Kidney cancer Sister    Breast cancer Sister    Breast cancer Sister    Melanoma Sister    Healthy Sister    Cancer Brother         pancreatic ca   Brain cancer Brother        astrocytoma?   Cancer Maternal Aunt    Social History   Socioeconomic History   Marital status: Widowed    Spouse name: Not on file   Number of children: 4   Years of education: Not on file   Highest education level: Not on file  Occupational History   Not on file  Tobacco Use   Smoking status: Never   Smokeless tobacco: Never  Vaping Use  Vaping status: Never Used  Substance and Sexual Activity   Alcohol use: Yes    Alcohol/week: 1.0 standard drink of alcohol    Types: 1 Glasses of wine per week    Comment: rarely   Drug use: No   Sexual activity: Not on file  Other Topics Concern   Not on file  Social History Narrative   Lives independently and 4 daughters live nearby and are supportive    Social Drivers of Health   Financial Resource Strain: Low Risk  (11/09/2022)   Overall Financial Resource Strain (CARDIA)    Difficulty of Paying Living Expenses: Not hard at all  Food Insecurity: No Food Insecurity (11/09/2022)   Hunger Vital Sign    Worried About Running Out of Food in the Last Year: Never true    Ran Out of Food in the Last Year: Never true  Transportation Needs: No Transportation Needs (11/09/2022)   PRAPARE - Administrator, Civil Service (Medical): No    Lack of Transportation (Non-Medical): No  Physical Activity: Sufficiently Active (11/09/2022)   Exercise Vital Sign    Days of Exercise per Week: 7 days    Minutes of Exercise per Session: 30 min  Stress: No Stress Concern Present (11/09/2022)   [employer] of Occupational Health - Occupational Stress Questionnaire    Feeling of Stress : Not at all  Social Connections: Moderately Integrated (11/09/2022)   Social Connection and Isolation Panel    Frequency of Communication with Friends and Family: More than three times a week    Frequency of Social Gatherings with Friends and Family: More than three times a week    Attends Religious Services: More  than 4 times per year    Active Member of [employer] or Organizations: Not on file    Attends Banker Meetings: More than 4 times per year    Marital Status: Widowed    Tobacco Counseling Counseling given: Not Answered   Clinical Intake:  Pre-visit preparation completed: Yes  Pain : 0-10 Pain Score: 2  Pain Type: Chronic pain Pain Location: Neck Pain Orientation: Mid Pain Descriptors / Indicators: Aching Pain Onset: More than a month ago Pain Frequency: Occasional Pain Relieving Factors: move less Effect of Pain on Daily Activities: None  Pain Relieving Factors: move less  BMI - recorded: 24.87 Nutritional Status: BMI of 19-24  Normal Nutritional Risks: None Diabetes: No  How often do you need to have someone help you when you read instructions, pamphlets, or other written materials from your doctor or pharmacy?: 1 - Never What is the last grade level you completed in school?: one year college  Interpreter Needed?: No  Information entered by :: Annabelle Rexroad Lorenda Hark NP   Activities of Daily Living    07/06/2024    3:53 PM  In your present state of health, do you have any difficulty performing the following activities:  Hearing? 1  Comment hearing aids  Vision? 0  Difficulty concentrating or making decisions? 0  Walking or climbing stairs? 1  Dressing or bathing? 0  Doing errands, shopping? 0  Preparing Food and eating ? N  Using the Toilet? N  In the past six months, have you accidently leaked urine? Y  Do you have problems with loss of bowel control? N  Managing your Medications? N  Managing your Finances? N  Housekeeping or managing your Housekeeping? N    Patient Care Team: Vondell Sowell X, NP as PCP - General (Internal Medicine)  Indicate any recent Medical Services you may have received from other than [facility] providers in the past year (date may be approximate).     Assessment:   This is a routine wellness examination for [name].  Hearing/Vision  screen No results found.   Goals Addressed   None    Depression Screen    07/06/2024    3:57 PM 07/06/2024    3:33 PM 06/01/2024    4:01 PM 10/22/2023    8:31 AM 07/21/2023    8:30 AM 05/27/2023    2:17 PM 11/09/2022    2:19 PM  PHQ 2/9 Scores  PHQ - 2 Score 0 0 0 0 0 0 0    Fall Risk    07/06/2024    3:32 PM 06/01/2024    4:01 PM 10/22/2023    8:30 AM 07/21/2023    8:31 AM 05/27/2023    2:17 PM  Fall Risk   Falls in the past year? 0 0 0 0 0  Number falls in past yr: 0 0 0 0 0  Injury with Fall? 0 0 0 0 0  Risk for fall due to : No Fall Risks No Fall Risks History of fall(s) No Fall Risks No Fall Risks  Follow up Falls evaluation completed Falls evaluation completed Falls evaluation completed Falls evaluation completed Falls evaluation completed    MEDICARE RISK AT HOME: Medicare Risk at Home Any stairs in or around the home?: Yes If so, are there any without handrails?: No Home free of loose throw rugs in walkways, pet beds, electrical cords, etc?: Yes Adequate lighting in your home to reduce risk of falls?: Yes Life alert?: No Grab bars in the bathroom?: Yes Shower chair or bench in shower?: Yes Elevated toilet seat or a handicapped toilet?: Yes  TIMED UP AND GO:  Was the test performed?  No    Cognitive Function:    09/22/2017    8:49 AM  MMSE - Mini Mental State Exam  Not completed: Refused        07/06/2024    3:33 PM 10/06/2018   10:57 AM 09/22/2016    8:49 AM  6CIT Screen  What Year? 0 points 0 points 0 points  What month? 0 points 0 points 0 points  What time? 0 points 0 points 0 points  Count back from 20 0 points 0 points 0 points  Months in reverse 0 points 0 points 0 points  Repeat phrase 0 points  0 points  Total Score 0 points  0 points    Immunizations Immunization History  Administered Date(s) Administered    sv, Bivalent, Protein Subunit Rsvpref,pf [place]) 08/07/2022   Fluad  Trivalent(High Dose 65+) 07/27/2023   INFLUENZA, HIGH DOSE  SEASONAL PF 07/25/2015, 08/17/2018, 06/28/2019   Influenza-Unspecified 08/07/2014, 07/27/2016, 07/23/2017, 07/25/2022   Moderna Covid-19 Fall Seasonal Vaccine 55yrs & older 07/27/2022   Moderna Covid-19 Vaccine Bivalent Booster 27yrs & up 03/13/2022   Moderna SARS-COV2 Booster Vaccination 11/27/2019, 08/21/2020   Moderna Sars-Covid-2 Vaccination 10/30/2019   PFIZER(Purple Top)SARS-COV-2 Vaccination 02/18/2021   PPD Test 11/23/2018   Pfizer Covid-19 Vaccine Bivalent Booster 31yrs & up 07/07/2021   Pfizer(Comirnaty )Fall Seasonal Vaccine 12 years and older 07/16/2023   Pneumococcal Conjugate-13 09/07/2014   Pneumococcal Polysaccharide-23 09/07/2005, 09/07/2010   Tdap 11/29/2015   Zoster Recombinant(Shingrix) 06/09/2018, 09/02/2018   Zoster, Live 01/20/2005    TDAP status: Up to date  Flu Vaccine status: Due, Education has been provided regarding the importance of this vaccine. Advised may receive this  vaccine at local pharmacy or Health Dept. Aware to provide a copy of the vaccination record if obtained from local pharmacy or Health Dept. Verbalized acceptance and understanding.  Pneumococcal vaccine status: Up to date  Covid-19 vaccine status: Information provided on how to obtain vaccines.   Qualifies for Shingles Vaccine? Yes   Zostavax completed Yes   Shingrix Completed?: Yes  Screening Tests Health Maintenance  Topic Date Due   Influenza Vaccine  05/26/2024   COVID-19 Vaccine (7 - Mixed Product risk 2024-25 season) 06/26/2024   Medicare Annual Wellness (AWV)  07/06/2025   DTaP/Tdap/Td (2 - Td or Tdap) 11/28/2025   Pneumococcal Vaccine: 50+ Years  Completed   DEXA SCAN  Completed   Zoster Vaccines- Shingrix  Completed   HPV VACCINES  Aged Out   Meningococcal B Vaccine  Aged Out    Health Maintenance  Health Maintenance Due  Topic Date Due   Influenza Vaccine  05/26/2024   COVID-19 Vaccine (7 - Mixed Product risk 2024-25 season) 06/26/2024    Colorectal cancer  screening: No longer required.   Mammogram status: No longer required due to aged out.  Bone Density status: Ordered 07/06/24. Pt provided with contact info and advised to call to schedule appt.  Lung Cancer Screening: (Low Dose CT Chest recommended if Age 14-80 years, 20 pack-year currently smoking OR have quit w/in 15years.) does not qualify.   Lung Cancer Screening Referral: NA  Additional Screening:  Hepatitis C Screening: does not qualify;   Vision Screening: Recommended annual ophthalmology exams for early detection of glaucoma and other disorders of the eye. Is the patient up to date with their annual eye exam?  Yes  Who is the provider or what is the name of the office in which the patient attends annual eye exams? Dr. Gobind If pt is not established with a provider, would they like to be referred to a provider to establish care? No .   Dental Screening: Recommended annual dental exams for proper oral hygiene  Diabetic Foot Exam: NA  Community Resource Referral / Chronic Care Management: CRR required this visit?  No   CCM required this visit?  No     Plan:     I have personally reviewed and noted the following in the patient's chart:   Medical and social history Use of alcohol, tobacco or illicit drugs  Current medications and supplements including opioid prescriptions. Patient is not currently taking opioid prescriptions. Functional ability and status Nutritional status Physical activity Advanced directives List of other physicians Hospitalizations, surgeries, and ER visits in previous 12 months Vitals Screenings to include cognitive, depression, and falls Referrals and appointments  In addition, I have reviewed and discussed with patient certain preventive protocols, quality metrics, and best practice recommendations. A written personalized care plan for preventive services as well as general preventive health recommendations were provided to patient.      Krzysztof Reichelt X Jayion Schneck, NP   07/07/2024   After Visit Summary: (In Person-Printed) AVS printed and given to the patient

## 2024-07-07 ENCOUNTER — Encounter: Payer: Self-pay | Admitting: Nurse Practitioner

## 2024-07-09 ENCOUNTER — Ambulatory Visit: Payer: Self-pay | Admitting: Cardiology

## 2024-07-12 DIAGNOSIS — E7800 Pure hypercholesterolemia, unspecified: Secondary | ICD-10-CM

## 2024-07-12 DIAGNOSIS — I209 Angina pectoris, unspecified: Secondary | ICD-10-CM

## 2024-07-12 NOTE — Telephone Encounter (Signed)
 MyChart message sent to patient.

## 2024-07-12 NOTE — Telephone Encounter (Signed)
 All prescription medications pend and sent to PCP Mast, Man X, NP. Some haven't been refilled by PCP Mast, Man X, NP. Pending approval.

## 2024-07-13 MED ORDER — METOPROLOL SUCCINATE ER 50 MG PO TB24: 50.0000 mg | ORAL_TABLET | Freq: Every day | ORAL | 3 refills | Status: AC

## 2024-07-13 MED ORDER — ISOSORBIDE MONONITRATE ER 60 MG PO TB24: 60.0000 mg | ORAL_TABLET | Freq: Every day | ORAL | 3 refills | Status: AC

## 2024-07-13 MED ORDER — OMEPRAZOLE 40 MG PO CPDR: 40.0000 mg | DELAYED_RELEASE_CAPSULE | Freq: Every day | ORAL | 3 refills | Status: AC

## 2024-07-13 MED ORDER — METHOTREXATE SODIUM 2.5 MG PO TABS: 17.5000 mg | ORAL_TABLET | ORAL | 3 refills | Status: DC

## 2024-07-13 MED ORDER — FAMOTIDINE 40 MG PO TABS: 40.0000 mg | ORAL_TABLET | Freq: Every day | ORAL | 3 refills | Status: AC

## 2024-07-13 MED ORDER — PREDNISONE 1 MG PO TABS: 3.0000 mg | ORAL_TABLET | Freq: Every day | ORAL | 3 refills | Status: AC

## 2024-07-13 MED ORDER — ATORVASTATIN CALCIUM 10 MG PO TABS: 10.0000 mg | ORAL_TABLET | Freq: Every day | ORAL | 3 refills | Status: AC

## 2024-07-13 MED ORDER — AMLODIPINE BESYLATE 5 MG PO TABS: 5.0000 mg | ORAL_TABLET | Freq: Every day | ORAL | 3 refills | Status: AC

## 2024-07-13 MED ORDER — CLOPIDOGREL BISULFATE 75 MG PO TABS: 75.0000 mg | ORAL_TABLET | Freq: Every day | ORAL | 3 refills | Status: AC

## 2024-07-13 MED ORDER — LOSARTAN POTASSIUM 100 MG PO TABS: 100.0000 mg | ORAL_TABLET | Freq: Every day | ORAL | 1 refills | Status: AC

## 2024-07-13 MED ORDER — LEVOTHYROXINE SODIUM 112 MCG PO TABS: 112.0000 ug | ORAL_TABLET | Freq: Every morning | ORAL | 3 refills | Status: AC

## 2024-07-13 NOTE — Progress Notes (Signed)
 Remote Loop Recorder Transmission

## 2024-07-17 ENCOUNTER — Ambulatory Visit: Payer: Medicare Other

## 2024-07-21 NOTE — Progress Notes (Signed)
 Remote Loop Recorder Transmission

## 2024-07-24 DIAGNOSIS — H35033 Hypertensive retinopathy, bilateral: Secondary | ICD-10-CM | POA: Diagnosis not present

## 2024-07-24 DIAGNOSIS — H43813 Vitreous degeneration, bilateral: Secondary | ICD-10-CM | POA: Diagnosis not present

## 2024-07-24 DIAGNOSIS — H353133 Nonexudative age-related macular degeneration, bilateral, advanced atrophic without subfoveal involvement: Secondary | ICD-10-CM | POA: Diagnosis not present

## 2024-07-24 DIAGNOSIS — H35372 Puckering of macula, left eye: Secondary | ICD-10-CM | POA: Diagnosis not present

## 2024-07-25 ENCOUNTER — Other Ambulatory Visit (HOSPITAL_BASED_OUTPATIENT_CLINIC_OR_DEPARTMENT_OTHER): Payer: Self-pay

## 2024-07-25 DIAGNOSIS — Z23 Encounter for immunization: Secondary | ICD-10-CM | POA: Diagnosis not present

## 2024-07-25 MED ORDER — FLUZONE HIGH-DOSE 0.5 ML IM SUSY
0.5000 mL | PREFILLED_SYRINGE | Freq: Once | INTRAMUSCULAR | 0 refills | Status: AC
  Filled 2024-07-25: qty 0.5, 1d supply, fill #0

## 2024-07-25 MED ORDER — COMIRNATY 30 MCG/0.3ML IM SUSY
0.3000 mL | PREFILLED_SYRINGE | Freq: Once | INTRAMUSCULAR | 0 refills | Status: AC
Start: 2024-07-25 — End: 2024-07-26
  Filled 2024-07-25: qty 0.3, 1d supply, fill #0

## 2024-07-26 DIAGNOSIS — H353134 Nonexudative age-related macular degeneration, bilateral, advanced atrophic with subfoveal involvement: Secondary | ICD-10-CM | POA: Diagnosis not present

## 2024-07-26 DIAGNOSIS — Z961 Presence of intraocular lens: Secondary | ICD-10-CM | POA: Diagnosis not present

## 2024-08-03 NOTE — Progress Notes (Signed)
 Remote Loop Recorder Transmission

## 2024-08-07 ENCOUNTER — Telehealth: Payer: Self-pay

## 2024-08-07 ENCOUNTER — Ambulatory Visit (INDEPENDENT_AMBULATORY_CARE_PROVIDER_SITE_OTHER)

## 2024-08-07 ENCOUNTER — Other Ambulatory Visit: Payer: Self-pay | Admitting: Nurse Practitioner

## 2024-08-07 DIAGNOSIS — H9190 Unspecified hearing loss, unspecified ear: Secondary | ICD-10-CM

## 2024-08-07 DIAGNOSIS — I639 Cerebral infarction, unspecified: Secondary | ICD-10-CM

## 2024-08-07 NOTE — Telephone Encounter (Signed)
 Message routed to PCP Mast, Man X, NP to place referral.

## 2024-08-07 NOTE — Telephone Encounter (Signed)
 Copied from CRM [id number]. Topic: Referral - Question >> Aug 07, 2024 11:48 AM Mercer PEDLAR wrote: Reason for CRM: [place] is calling from [facility] to reguest a referral to be sent over for patient's hraring test. Patient has an appointment scheduled today 08/07/24 at 3:30 PM.   Fax: [phone] Callback: [phone]

## 2024-08-07 NOTE — Telephone Encounter (Signed)
 Message routed to referral coordinator Carla.B. I have also faxed referral to [facility] as requested.

## 2024-08-08 LAB — CUP PACEART REMOTE DEVICE CHECK
Date Time Interrogation Session: 20251013104500
Implantable Pulse Generator Implant Date: 20230929
Pulse Gen Serial Number: 192101

## 2024-08-09 ENCOUNTER — Ambulatory Visit: Payer: Self-pay | Admitting: Cardiology

## 2024-08-09 NOTE — Progress Notes (Signed)
 Remote Loop Recorder Transmission

## 2024-08-15 DIAGNOSIS — R1312 Dysphagia, oropharyngeal phase: Secondary | ICD-10-CM | POA: Diagnosis not present

## 2024-08-21 ENCOUNTER — Ambulatory Visit: Payer: Medicare Other

## 2024-08-22 DIAGNOSIS — H35372 Puckering of macula, left eye: Secondary | ICD-10-CM | POA: Diagnosis not present

## 2024-08-22 DIAGNOSIS — H43813 Vitreous degeneration, bilateral: Secondary | ICD-10-CM | POA: Diagnosis not present

## 2024-08-22 DIAGNOSIS — H353114 Nonexudative age-related macular degeneration, right eye, advanced atrophic with subfoveal involvement: Secondary | ICD-10-CM | POA: Diagnosis not present

## 2024-08-22 DIAGNOSIS — H35033 Hypertensive retinopathy, bilateral: Secondary | ICD-10-CM | POA: Diagnosis not present

## 2024-09-05 DIAGNOSIS — Z6824 Body mass index (BMI) 24.0-24.9, adult: Secondary | ICD-10-CM | POA: Diagnosis not present

## 2024-09-05 DIAGNOSIS — Z8739 Personal history of other diseases of the musculoskeletal system and connective tissue: Secondary | ICD-10-CM | POA: Diagnosis not present

## 2024-09-05 DIAGNOSIS — M1991 Primary osteoarthritis, unspecified site: Secondary | ICD-10-CM | POA: Diagnosis not present

## 2024-09-05 DIAGNOSIS — D649 Anemia, unspecified: Secondary | ICD-10-CM | POA: Diagnosis not present

## 2024-09-05 DIAGNOSIS — Z840 Family history of diseases of the skin and subcutaneous tissue: Secondary | ICD-10-CM | POA: Diagnosis not present

## 2024-09-05 DIAGNOSIS — M810 Age-related osteoporosis without current pathological fracture: Secondary | ICD-10-CM | POA: Diagnosis not present

## 2024-09-05 DIAGNOSIS — Z7952 Long term (current) use of systemic steroids: Secondary | ICD-10-CM | POA: Diagnosis not present

## 2024-09-05 DIAGNOSIS — E559 Vitamin D deficiency, unspecified: Secondary | ICD-10-CM | POA: Diagnosis not present

## 2024-09-05 DIAGNOSIS — M0600 Rheumatoid arthritis without rheumatoid factor, unspecified site: Secondary | ICD-10-CM | POA: Diagnosis not present

## 2024-09-07 ENCOUNTER — Ambulatory Visit (INDEPENDENT_AMBULATORY_CARE_PROVIDER_SITE_OTHER)

## 2024-09-07 DIAGNOSIS — I639 Cerebral infarction, unspecified: Secondary | ICD-10-CM

## 2024-09-08 LAB — CUP PACEART REMOTE DEVICE CHECK
Date Time Interrogation Session: 20251113075000
Implantable Pulse Generator Implant Date: 20230929
Pulse Gen Serial Number: 192101

## 2024-09-12 ENCOUNTER — Ambulatory Visit: Payer: Self-pay | Admitting: Cardiology

## 2024-09-12 NOTE — Progress Notes (Signed)
 Remote Loop Recorder Transmission

## 2024-09-19 DIAGNOSIS — H353133 Nonexudative age-related macular degeneration, bilateral, advanced atrophic without subfoveal involvement: Secondary | ICD-10-CM | POA: Diagnosis not present

## 2024-09-19 DIAGNOSIS — H43813 Vitreous degeneration, bilateral: Secondary | ICD-10-CM | POA: Diagnosis not present

## 2024-09-19 DIAGNOSIS — H35033 Hypertensive retinopathy, bilateral: Secondary | ICD-10-CM | POA: Diagnosis not present

## 2024-09-19 DIAGNOSIS — H35372 Puckering of macula, left eye: Secondary | ICD-10-CM | POA: Diagnosis not present

## 2024-09-25 ENCOUNTER — Ambulatory Visit: Payer: Medicare Other

## 2024-10-09 ENCOUNTER — Ambulatory Visit

## 2024-10-09 ENCOUNTER — Other Ambulatory Visit: Payer: Self-pay

## 2024-10-09 DIAGNOSIS — I639 Cerebral infarction, unspecified: Secondary | ICD-10-CM | POA: Diagnosis not present

## 2024-10-09 MED ORDER — METHOTREXATE SODIUM 2.5 MG PO TABS: 17.5000 mg | ORAL_TABLET | ORAL | 3 refills | Status: AC

## 2024-10-09 NOTE — Telephone Encounter (Signed)
 Clarification needed for refill request.  Prescription is written for 7 tables 1 time weekly but sent for 4 tablets.  Please clarify and submit correct directions.

## 2024-10-11 LAB — CUP PACEART REMOTE DEVICE CHECK
Date Time Interrogation Session: 20251215041900
Implantable Pulse Generator Implant Date: 20230929
Pulse Gen Serial Number: 192101

## 2024-10-13 NOTE — Progress Notes (Signed)
 Remote Loop Recorder Transmission

## 2024-10-23 ENCOUNTER — Ambulatory Visit: Payer: Self-pay | Admitting: Cardiology

## 2024-10-30 ENCOUNTER — Ambulatory Visit: Payer: Medicare Other

## 2024-11-06 ENCOUNTER — Telehealth: Payer: Self-pay | Admitting: *Deleted

## 2024-11-06 ENCOUNTER — Other Ambulatory Visit: Payer: Self-pay | Admitting: Nurse Practitioner

## 2024-11-06 DIAGNOSIS — M810 Age-related osteoporosis without current pathological fracture: Secondary | ICD-10-CM

## 2024-11-06 MED ORDER — DENOSUMAB 60 MG/ML ~~LOC~~ SOSY
60.0000 mg | PREFILLED_SYRINGE | SUBCUTANEOUS | Status: AC
  Administered 2024-11-14: 60 mg via SUBCUTANEOUS

## 2024-11-06 NOTE — Telephone Encounter (Signed)
 Verification sent to [employer] for patient's Prolia  Injection that's due. Awaiting Verification.   Patient is due for Labs for Prolia  injection.   Message sent to [facility] to schedule and place an Order.

## 2024-11-06 NOTE — Telephone Encounter (Signed)
 Received Verification from [employer] for Prolia  No Copay and No PA Darice Fearing, is going to call patient to schedule lab and Prolia  Appointment.

## 2024-11-09 ENCOUNTER — Ambulatory Visit: Attending: Cardiology

## 2024-11-09 DIAGNOSIS — I639 Cerebral infarction, unspecified: Secondary | ICD-10-CM | POA: Diagnosis not present

## 2024-11-09 LAB — CUP PACEART REMOTE DEVICE CHECK
Date Time Interrogation Session: 20260115082500
Implantable Pulse Generator Implant Date: 20230929
Pulse Gen Serial Number: 192101

## 2024-11-10 ENCOUNTER — Ambulatory Visit: Payer: Self-pay | Admitting: Cardiology

## 2024-11-14 ENCOUNTER — Ambulatory Visit

## 2024-11-14 DIAGNOSIS — M810 Age-related osteoporosis without current pathological fracture: Secondary | ICD-10-CM

## 2024-11-14 MED ORDER — DENOSUMAB 60 MG/ML ~~LOC~~ SOSY: 60.0000 mg | PREFILLED_SYRINGE | SUBCUTANEOUS | Status: AC

## 2024-11-14 NOTE — Progress Notes (Signed)
 Patient is in office today for a nurse visit for  prolia injection . Patient Injection was given in the  Left deltoid. Patient tolerated injection well.

## 2024-11-17 NOTE — Progress Notes (Signed)
 Remote Loop Recorder Transmission

## 2024-12-07 ENCOUNTER — Encounter: Payer: Self-pay | Admitting: Nurse Practitioner

## 2024-12-10 ENCOUNTER — Ambulatory Visit

## 2025-01-10 ENCOUNTER — Ambulatory Visit

## 2025-02-10 ENCOUNTER — Ambulatory Visit

## 2025-02-19 ENCOUNTER — Ambulatory Visit: Admitting: Neurology

## 2025-03-13 ENCOUNTER — Ambulatory Visit

## 2025-05-17 ENCOUNTER — Ambulatory Visit
